# Patient Record
Sex: Female | Born: 1945
Health system: Southern US, Community
[De-identification: ages and names within clinical notes are randomized; demographics above are authoritative.]

## PROBLEM LIST (undated history)

## (undated) DIAGNOSIS — I10 Essential (primary) hypertension: Secondary | ICD-10-CM

## (undated) DIAGNOSIS — N200 Calculus of kidney: Secondary | ICD-10-CM

## (undated) DIAGNOSIS — E785 Hyperlipidemia, unspecified: Secondary | ICD-10-CM

## (undated) DIAGNOSIS — R002 Palpitations: Secondary | ICD-10-CM

## (undated) DIAGNOSIS — I471 Supraventricular tachycardia, unspecified: Secondary | ICD-10-CM

## (undated) DIAGNOSIS — R7303 Prediabetes: Secondary | ICD-10-CM

## (undated) DIAGNOSIS — R0789 Other chest pain: Secondary | ICD-10-CM

## (undated) DIAGNOSIS — F419 Anxiety disorder, unspecified: Secondary | ICD-10-CM

## (undated) DIAGNOSIS — K219 Gastro-esophageal reflux disease without esophagitis: Secondary | ICD-10-CM

## (undated) HISTORY — DX: Prediabetes: R73.03

## (undated) HISTORY — PX: TUBAL LIGATION: SHX77

## (undated) HISTORY — DX: Other chest pain: R07.89

## (undated) HISTORY — DX: Hyperlipidemia, unspecified: E78.5

## (undated) HISTORY — DX: Supraventricular tachycardia, unspecified: I47.10

## (undated) HISTORY — DX: Supraventricular tachycardia: I47.1

---

## 1997-06-21 ENCOUNTER — Ambulatory Visit (HOSPITAL_COMMUNITY): Admission: RE | Admit: 1997-06-21 | Discharge: 1997-06-21 | Payer: Self-pay | Admitting: Obstetrics and Gynecology

## 1997-11-13 ENCOUNTER — Other Ambulatory Visit: Admission: RE | Admit: 1997-11-13 | Discharge: 1997-11-13 | Payer: Self-pay | Admitting: Obstetrics and Gynecology

## 1998-11-19 ENCOUNTER — Other Ambulatory Visit: Admission: RE | Admit: 1998-11-19 | Discharge: 1998-11-19 | Payer: Self-pay | Admitting: Obstetrics and Gynecology

## 1998-12-03 ENCOUNTER — Ambulatory Visit (HOSPITAL_COMMUNITY): Admission: RE | Admit: 1998-12-03 | Discharge: 1998-12-03 | Payer: Self-pay | Admitting: Internal Medicine

## 1999-12-02 ENCOUNTER — Other Ambulatory Visit: Admission: RE | Admit: 1999-12-02 | Discharge: 1999-12-02 | Payer: Self-pay | Admitting: Obstetrics and Gynecology

## 2001-05-09 ENCOUNTER — Other Ambulatory Visit: Admission: RE | Admit: 2001-05-09 | Discharge: 2001-05-09 | Payer: Self-pay | Admitting: Obstetrics and Gynecology

## 2001-06-01 ENCOUNTER — Encounter: Payer: Self-pay | Admitting: Obstetrics and Gynecology

## 2001-06-01 ENCOUNTER — Ambulatory Visit (HOSPITAL_COMMUNITY): Admission: RE | Admit: 2001-06-01 | Discharge: 2001-06-01 | Payer: Self-pay | Admitting: Obstetrics and Gynecology

## 2002-05-29 ENCOUNTER — Other Ambulatory Visit: Admission: RE | Admit: 2002-05-29 | Discharge: 2002-05-29 | Payer: Self-pay | Admitting: Obstetrics and Gynecology

## 2003-07-13 ENCOUNTER — Other Ambulatory Visit: Admission: RE | Admit: 2003-07-13 | Discharge: 2003-07-13 | Payer: Self-pay | Admitting: Obstetrics and Gynecology

## 2004-10-21 ENCOUNTER — Other Ambulatory Visit: Admission: RE | Admit: 2004-10-21 | Discharge: 2004-10-21 | Payer: Self-pay | Admitting: Obstetrics and Gynecology

## 2005-07-18 ENCOUNTER — Emergency Department (HOSPITAL_COMMUNITY): Admission: EM | Admit: 2005-07-18 | Discharge: 2005-07-18 | Payer: Self-pay | Admitting: Family Medicine

## 2005-10-01 ENCOUNTER — Encounter: Admission: RE | Admit: 2005-10-01 | Discharge: 2005-10-01 | Payer: Self-pay | Admitting: Internal Medicine

## 2008-01-03 ENCOUNTER — Encounter: Admission: RE | Admit: 2008-01-03 | Discharge: 2008-01-03 | Payer: Self-pay | Admitting: Neurology

## 2009-06-19 HISTORY — PX: CARDIOVASCULAR STRESS TEST: SHX262

## 2009-10-20 ENCOUNTER — Observation Stay (HOSPITAL_COMMUNITY): Admission: EM | Admit: 2009-10-20 | Discharge: 2009-10-20 | Payer: Self-pay | Admitting: Emergency Medicine

## 2010-02-16 ENCOUNTER — Encounter: Payer: Self-pay | Admitting: Orthopedic Surgery

## 2010-04-10 LAB — URINALYSIS, ROUTINE W REFLEX MICROSCOPIC
Bilirubin Urine: NEGATIVE
Glucose, UA: NEGATIVE mg/dL
Hgb urine dipstick: NEGATIVE
Ketones, ur: NEGATIVE mg/dL
Nitrite: NEGATIVE
Protein, ur: 30 mg/dL — AB
Specific Gravity, Urine: 1.019 (ref 1.005–1.030)
Urobilinogen, UA: 0.2 mg/dL (ref 0.0–1.0)
pH: 8.5 — ABNORMAL HIGH (ref 5.0–8.0)

## 2010-04-10 LAB — URINE MICROSCOPIC-ADD ON

## 2010-06-21 ENCOUNTER — Emergency Department (HOSPITAL_COMMUNITY)
Admission: EM | Admit: 2010-06-21 | Discharge: 2010-06-22 | Disposition: A | Discharge status: Home / Self Care | Payer: BC Managed Care – PPO | Attending: Emergency Medicine | Admitting: Emergency Medicine

## 2010-06-21 DIAGNOSIS — R61 Generalized hyperhidrosis: Secondary | ICD-10-CM | POA: Insufficient documentation

## 2010-06-21 DIAGNOSIS — R1032 Left lower quadrant pain: Secondary | ICD-10-CM | POA: Insufficient documentation

## 2010-06-21 DIAGNOSIS — R0989 Other specified symptoms and signs involving the circulatory and respiratory systems: Secondary | ICD-10-CM | POA: Insufficient documentation

## 2010-06-21 DIAGNOSIS — R0789 Other chest pain: Secondary | ICD-10-CM | POA: Insufficient documentation

## 2010-06-21 DIAGNOSIS — R51 Headache: Secondary | ICD-10-CM | POA: Insufficient documentation

## 2010-06-21 DIAGNOSIS — R0609 Other forms of dyspnea: Secondary | ICD-10-CM | POA: Insufficient documentation

## 2010-06-21 DIAGNOSIS — R1012 Left upper quadrant pain: Secondary | ICD-10-CM | POA: Insufficient documentation

## 2010-06-21 DIAGNOSIS — R110 Nausea: Secondary | ICD-10-CM | POA: Insufficient documentation

## 2010-06-21 DIAGNOSIS — N390 Urinary tract infection, site not specified: Secondary | ICD-10-CM | POA: Insufficient documentation

## 2010-06-21 DIAGNOSIS — R42 Dizziness and giddiness: Secondary | ICD-10-CM | POA: Insufficient documentation

## 2010-06-22 LAB — URINALYSIS, ROUTINE W REFLEX MICROSCOPIC
Bilirubin Urine: NEGATIVE
Glucose, UA: NEGATIVE mg/dL
Ketones, ur: NEGATIVE mg/dL
Nitrite: NEGATIVE
Protein, ur: NEGATIVE mg/dL
Specific Gravity, Urine: 1.006 (ref 1.005–1.030)
Urobilinogen, UA: 0.2 mg/dL (ref 0.0–1.0)
pH: 7.5 (ref 5.0–8.0)

## 2010-06-22 LAB — COMPREHENSIVE METABOLIC PANEL
ALT: 20 U/L (ref 0–35)
AST: 21 U/L (ref 0–37)
Albumin: 3.9 g/dL (ref 3.5–5.2)
Alkaline Phosphatase: 69 U/L (ref 39–117)
BUN: 14 mg/dL (ref 6–23)
CO2: 23 mEq/L (ref 19–32)
Calcium: 9.4 mg/dL (ref 8.4–10.5)
Chloride: 106 mEq/L (ref 96–112)
Creatinine, Ser: 0.67 mg/dL (ref 0.4–1.2)
GFR calc Af Amer: >60 mL/min (ref >60)
GFR calc non Af Amer: >60 mL/min (ref >60)
Glucose, Bld: 134 mg/dL — ABNORMAL HIGH (ref 70–99)
Potassium: 3.5 mEq/L (ref 3.5–5.1)
Sodium: 141 mEq/L (ref 135–145)
Total Bilirubin: 0.2 mg/dL — ABNORMAL LOW (ref 0.3–1.2)
Total Protein: 7.6 g/dL (ref 6.0–8.3)

## 2010-06-22 LAB — CBC
HCT: 37.6 % (ref 36.0–46.0)
Hemoglobin: 13.3 g/dL (ref 12.0–15.0)
MCH: 30.1 pg (ref 26.0–34.0)
MCHC: 35.4 g/dL (ref 30.0–36.0)
MCV: 85.1 fL (ref 78.0–100.0)
Platelets: 248 10*3/uL (ref 150–400)
RBC: 4.42 MIL/uL (ref 3.87–5.11)
RDW: 12.9 % (ref 11.5–15.5)
WBC: 11.1 10*3/uL — ABNORMAL HIGH (ref 4.0–10.5)

## 2010-06-22 LAB — DIFFERENTIAL
Basophils Absolute: 0 10*3/uL (ref 0.0–0.1)
Basophils Relative: 0 % (ref 0–1)
Eosinophils Absolute: 0.1 10*3/uL (ref 0.0–0.7)
Eosinophils Relative: 1 % (ref 0–5)
Lymphocytes Relative: 20 % (ref 12–46)
Lymphs Abs: 2.2 10*3/uL (ref 0.7–4.0)
Monocytes Absolute: 0.4 10*3/uL (ref 0.1–1.0)
Monocytes Relative: 4 % (ref 3–12)
Neutro Abs: 8.4 10*3/uL — ABNORMAL HIGH (ref 1.7–7.7)
Neutrophils Relative %: 76 % (ref 43–77)

## 2010-06-22 LAB — CK TOTAL AND CKMB (NOT AT ARMC)
CK, MB: 1.5 ng/mL (ref 0.3–4.0)
Relative Index: 1.4 (ref 0.0–2.5)
Total CK: 104 U/L (ref 7–177)

## 2010-06-22 LAB — TROPONIN I: Troponin I: <0.3 ng/mL (ref <0.30)

## 2010-06-22 LAB — URINE MICROSCOPIC-ADD ON

## 2010-06-22 LAB — LIPASE, BLOOD: Lipase: 17 U/L (ref 11–59)

## 2011-08-05 DIAGNOSIS — F411 Generalized anxiety disorder: Secondary | ICD-10-CM | POA: Diagnosis not present

## 2011-08-05 DIAGNOSIS — R82998 Other abnormal findings in urine: Secondary | ICD-10-CM | POA: Diagnosis not present

## 2011-08-05 DIAGNOSIS — I10 Essential (primary) hypertension: Secondary | ICD-10-CM | POA: Diagnosis not present

## 2011-12-14 DIAGNOSIS — R1031 Right lower quadrant pain: Secondary | ICD-10-CM | POA: Diagnosis not present

## 2011-12-14 DIAGNOSIS — I10 Essential (primary) hypertension: Secondary | ICD-10-CM | POA: Diagnosis not present

## 2011-12-14 DIAGNOSIS — R002 Palpitations: Secondary | ICD-10-CM | POA: Diagnosis not present

## 2011-12-14 DIAGNOSIS — E559 Vitamin D deficiency, unspecified: Secondary | ICD-10-CM | POA: Diagnosis not present

## 2011-12-14 DIAGNOSIS — Z0000 Encounter for general adult medical examination without abnormal findings: Secondary | ICD-10-CM | POA: Diagnosis not present

## 2011-12-22 DIAGNOSIS — R1031 Right lower quadrant pain: Secondary | ICD-10-CM | POA: Diagnosis not present

## 2011-12-22 DIAGNOSIS — R109 Unspecified abdominal pain: Secondary | ICD-10-CM | POA: Diagnosis not present

## 2012-01-27 ENCOUNTER — Emergency Department (HOSPITAL_COMMUNITY)
Admission: EM | Admit: 2012-01-27 | Discharge: 2012-01-27 | Disposition: A | Discharge status: Home / Self Care | Payer: BC Managed Care – PPO | Source: Home / Self Care | Attending: Family Medicine | Admitting: Family Medicine

## 2012-01-27 ENCOUNTER — Encounter (HOSPITAL_COMMUNITY): Payer: Self-pay | Admitting: *Deleted

## 2012-01-27 DIAGNOSIS — R002 Palpitations: Secondary | ICD-10-CM

## 2012-01-27 HISTORY — DX: Anxiety disorder, unspecified: F41.9

## 2012-01-27 HISTORY — DX: Essential (primary) hypertension: I10

## 2012-01-27 HISTORY — DX: Palpitations: R00.2

## 2012-01-27 NOTE — ED Provider Notes (Signed)
History     CSN: [id number]  Arrival date & time 01/27/12  1155   First MD Initiated Contact with Patient 01/27/12 1309      Chief Complaint  Patient presents with  . Palpitations  . Shortness of Breath    (Consider location/radiation/quality/duration/timing/severity/associated sxs/prior treatment) Patient is a 67 y.o. female presenting with palpitations and shortness of breath. The history is provided by the patient and the spouse.  Palpitations  This is a recurrent problem. The current episode started more than 2 days ago. The problem has not changed since onset.The problem is associated with anxiety. Associated symptoms include irregular heartbeat, near-syncope and shortness of breath. Pertinent negatives include no fever, no chest pain and no chest pressure. Her past medical history does not include hyperthyroidism.  Shortness of Breath  Associated symptoms include shortness of breath. Pertinent negatives include no chest pain, no chest pressure, no fever and no wheezing.    Past Medical History  Diagnosis Date  . Heart palpitations   . Anxiety   . Hypertension     Past Surgical History  Procedure Date  . Tubal ligation     No family history on file.  History  Substance Use Topics  . Smoking status: Never Smoker   . Smokeless tobacco: Not on file  . Alcohol Use: No    OB History    Grav Para Term Preterm Abortions TAB SAB Ect Mult Living                  Review of Systems  Constitutional: Negative for fever.  Respiratory: Positive for shortness of breath. Negative for chest tightness and wheezing.   Cardiovascular: Positive for palpitations and near-syncope. Negative for chest pain and leg swelling.    Allergies  Penicillins  Home Medications   Current Outpatient Rx  Name  Route  Sig  Dispense  Refill  . ALPRAZOLAM 0.25 MG PO TABS   Oral   Take 0.25 mg by mouth as needed.         . ASPIRIN 81 MG PO TABS   Oral   Take 81 mg by mouth daily.           [name] CALCIUM CITRATE PO   Oral   Take by mouth.         . METOPROLOL SUCCINATE ER 25 MG PO TB24   Oral   Take 25 mg by mouth daily.         . CENTRUM SILVER PO   Oral   Take by mouth.           BP 137/65  Pulse 71  Temp 98 F (36.7 C) (Oral)  Resp 20  SpO2 97%  Physical Exam  Nursing note and vitals reviewed. Constitutional: She is oriented to person, place, and time. She appears well-developed and well-nourished.  HENT:  Mouth/Throat: Oropharynx is clear and moist.  Eyes: Pupils are equal, round, and reactive to light.  Neck: Normal range of motion. Neck supple. No thyromegaly present.  Cardiovascular: Normal rate, regular rhythm, normal heart sounds and intact distal pulses.   Pulmonary/Chest: Effort normal and breath sounds normal.  Neurological: She is alert and oriented to person, place, and time.  Skin: Skin is warm and dry.    ED Course  Procedures (including critical care time)  Labs Reviewed - No data to display No results found.   1. Heart palpitations       MDM  [id number].  Linna Hoff, MD 01/27/12 1330

## 2012-01-27 NOTE — ED Notes (Signed)
Has been having SOB with palpitations over past 3 days; sxs worse when laying down and when driving to work.  States she feels near-syncopal at times or "like I can't catch my breath".  Has hx palpitations - hasn't seen cardiologist in several years.  Denies any palpitations or SOB @ present.  HR regular @ present.  Also c/o tingling to left hand intermittently over past month; has hx bursitis.

## 2012-08-01 DIAGNOSIS — I10 Essential (primary) hypertension: Secondary | ICD-10-CM | POA: Diagnosis not present

## 2012-08-01 DIAGNOSIS — Z79899 Other long term (current) drug therapy: Secondary | ICD-10-CM | POA: Diagnosis not present

## 2012-09-30 ENCOUNTER — Other Ambulatory Visit: Payer: Self-pay | Admitting: Family

## 2012-09-30 DIAGNOSIS — R319 Hematuria, unspecified: Secondary | ICD-10-CM | POA: Diagnosis not present

## 2012-09-30 DIAGNOSIS — R1031 Right lower quadrant pain: Secondary | ICD-10-CM | POA: Diagnosis not present

## 2012-09-30 DIAGNOSIS — Z79899 Other long term (current) drug therapy: Secondary | ICD-10-CM | POA: Diagnosis not present

## 2012-09-30 DIAGNOSIS — R109 Unspecified abdominal pain: Secondary | ICD-10-CM

## 2012-10-03 ENCOUNTER — Ambulatory Visit
Admission: RE | Admit: 2012-10-03 | Discharge: 2012-10-03 | Disposition: A | Discharge status: Home / Self Care | Payer: Medicare Other | Source: Ambulatory Visit | Attending: Family | Admitting: Family

## 2012-10-03 DIAGNOSIS — R109 Unspecified abdominal pain: Secondary | ICD-10-CM

## 2012-10-03 DIAGNOSIS — N200 Calculus of kidney: Secondary | ICD-10-CM | POA: Diagnosis not present

## 2012-10-03 DIAGNOSIS — R319 Hematuria, unspecified: Secondary | ICD-10-CM

## 2013-01-18 DIAGNOSIS — R4589 Other symptoms and signs involving emotional state: Secondary | ICD-10-CM | POA: Diagnosis not present

## 2013-01-18 DIAGNOSIS — E785 Hyperlipidemia, unspecified: Secondary | ICD-10-CM | POA: Diagnosis not present

## 2013-01-18 DIAGNOSIS — Z79899 Other long term (current) drug therapy: Secondary | ICD-10-CM | POA: Diagnosis not present

## 2013-01-18 DIAGNOSIS — I10 Essential (primary) hypertension: Secondary | ICD-10-CM | POA: Diagnosis not present

## 2013-01-18 DIAGNOSIS — Z0000 Encounter for general adult medical examination without abnormal findings: Secondary | ICD-10-CM | POA: Diagnosis not present

## 2013-02-10 DIAGNOSIS — M899 Disorder of bone, unspecified: Secondary | ICD-10-CM | POA: Diagnosis not present

## 2013-02-10 DIAGNOSIS — N949 Unspecified condition associated with female genital organs and menstrual cycle: Secondary | ICD-10-CM | POA: Diagnosis not present

## 2013-02-10 DIAGNOSIS — M949 Disorder of cartilage, unspecified: Secondary | ICD-10-CM | POA: Diagnosis not present

## 2013-02-10 DIAGNOSIS — Z124 Encounter for screening for malignant neoplasm of cervix: Secondary | ICD-10-CM | POA: Diagnosis not present

## 2013-02-10 DIAGNOSIS — Z1231 Encounter for screening mammogram for malignant neoplasm of breast: Secondary | ICD-10-CM | POA: Diagnosis not present

## 2013-02-10 DIAGNOSIS — Z9189 Other specified personal risk factors, not elsewhere classified: Secondary | ICD-10-CM | POA: Diagnosis not present

## 2013-02-10 DIAGNOSIS — R1031 Right lower quadrant pain: Secondary | ICD-10-CM | POA: Diagnosis not present

## 2013-02-23 DIAGNOSIS — K5900 Constipation, unspecified: Secondary | ICD-10-CM | POA: Diagnosis not present

## 2013-02-23 DIAGNOSIS — Z1211 Encounter for screening for malignant neoplasm of colon: Secondary | ICD-10-CM | POA: Diagnosis not present

## 2013-03-12 ENCOUNTER — Emergency Department (HOSPITAL_COMMUNITY): Payer: Medicare Other

## 2013-03-12 ENCOUNTER — Emergency Department (HOSPITAL_COMMUNITY)
Admission: EM | Admit: 2013-03-12 | Discharge: 2013-03-12 | Disposition: A | Discharge status: Home / Self Care | Payer: Medicare Other | Attending: Emergency Medicine | Admitting: Emergency Medicine

## 2013-03-12 ENCOUNTER — Encounter (HOSPITAL_COMMUNITY): Payer: Self-pay | Admitting: Emergency Medicine

## 2013-03-12 DIAGNOSIS — Z880 Allergy status to penicillin: Secondary | ICD-10-CM | POA: Diagnosis not present

## 2013-03-12 DIAGNOSIS — Z8659 Personal history of other mental and behavioral disorders: Secondary | ICD-10-CM | POA: Diagnosis not present

## 2013-03-12 DIAGNOSIS — J159 Unspecified bacterial pneumonia: Secondary | ICD-10-CM | POA: Diagnosis not present

## 2013-03-12 DIAGNOSIS — J111 Influenza due to unidentified influenza virus with other respiratory manifestations: Secondary | ICD-10-CM | POA: Diagnosis not present

## 2013-03-12 DIAGNOSIS — R059 Cough, unspecified: Secondary | ICD-10-CM | POA: Diagnosis not present

## 2013-03-12 DIAGNOSIS — Z79899 Other long term (current) drug therapy: Secondary | ICD-10-CM | POA: Diagnosis not present

## 2013-03-12 DIAGNOSIS — I10 Essential (primary) hypertension: Secondary | ICD-10-CM | POA: Diagnosis not present

## 2013-03-12 DIAGNOSIS — J189 Pneumonia, unspecified organism: Secondary | ICD-10-CM

## 2013-03-12 DIAGNOSIS — Z7982 Long term (current) use of aspirin: Secondary | ICD-10-CM | POA: Insufficient documentation

## 2013-03-12 DIAGNOSIS — R05 Cough: Secondary | ICD-10-CM | POA: Diagnosis not present

## 2013-03-12 DIAGNOSIS — R6889 Other general symptoms and signs: Secondary | ICD-10-CM | POA: Diagnosis not present

## 2013-03-12 DIAGNOSIS — R509 Fever, unspecified: Secondary | ICD-10-CM | POA: Diagnosis not present

## 2013-03-12 LAB — CBC WITH DIFFERENTIAL/PLATELET
Basophils Absolute: 0 10*3/uL (ref 0.0–0.1)
Basophils Relative: 0 % (ref 0–1)
Eosinophils Absolute: 0 10*3/uL (ref 0.0–0.7)
Eosinophils Relative: 0 % (ref 0–5)
HCT: 35.8 % — ABNORMAL LOW (ref 36.0–46.0)
Hemoglobin: 12.8 g/dL (ref 12.0–15.0)
Lymphocytes Relative: 8 % — ABNORMAL LOW (ref 12–46)
Lymphs Abs: 1.1 10*3/uL (ref 0.7–4.0)
MCH: 30.8 pg (ref 26.0–34.0)
MCHC: 35.8 g/dL (ref 30.0–36.0)
MCV: 86.3 fL (ref 78.0–100.0)
Monocytes Absolute: 0.7 10*3/uL (ref 0.1–1.0)
Monocytes Relative: 5 % (ref 3–12)
Neutro Abs: 12.2 10*3/uL — ABNORMAL HIGH (ref 1.7–7.7)
Neutrophils Relative %: 87 % — ABNORMAL HIGH (ref 43–77)
Platelets: 208 10*3/uL (ref 150–400)
RBC: 4.15 MIL/uL (ref 3.87–5.11)
RDW: 13.5 % (ref 11.5–15.5)
WBC Morphology: INCREASED
WBC: 14 10*3/uL — ABNORMAL HIGH (ref 4.0–10.5)

## 2013-03-12 LAB — BASIC METABOLIC PANEL
BUN: 9 mg/dL (ref 6–23)
CO2: 21 mEq/L (ref 19–32)
Calcium: 8.8 mg/dL (ref 8.4–10.5)
Chloride: 100 mEq/L (ref 96–112)
Creatinine, Ser: 0.63 mg/dL (ref 0.50–1.10)
GFR calc Af Amer: >90 mL/min (ref >90)
GFR calc non Af Amer: >90 mL/min (ref >90)
Glucose, Bld: 116 mg/dL — ABNORMAL HIGH (ref 70–99)
Potassium: 3.6 mEq/L — ABNORMAL LOW (ref 3.7–5.3)
Sodium: 138 mEq/L (ref 137–147)

## 2013-03-12 LAB — TROPONIN I: Troponin I: <0.3 ng/mL (ref <0.30)

## 2013-03-12 MED ORDER — AZITHROMYCIN 250 MG PO TABS
500.0000 mg | ORAL_TABLET | Freq: Once | ORAL | Status: AC
  Administered 2013-03-12: 500 mg via ORAL
  Filled 2013-03-12: qty 2

## 2013-03-12 MED ORDER — BENZONATATE 100 MG PO CAPS: 200.0000 mg | ORAL_CAPSULE | Freq: Two times a day (BID) | ORAL | Status: DC

## 2013-03-12 MED ORDER — CEFTRIAXONE SODIUM 1 G IJ SOLR: 1.0000 g | Freq: Once | INTRAMUSCULAR | Status: DC

## 2013-03-12 MED ORDER — AZITHROMYCIN 250 MG PO TABS: 250.0000 mg | ORAL_TABLET | Freq: Every day | ORAL | Status: DC

## 2013-03-12 MED ORDER — DEXTROSE 5 % IV SOLN
1.0000 g | INTRAVENOUS | Status: DC
  Administered 2013-03-12: 1 g via INTRAVENOUS
  Filled 2013-03-12: qty 10

## 2013-03-12 NOTE — ED Notes (Signed)
To ED from home via [facility] with report of fever and cough X1w, denies V/D, no pain at rest, denies CP/SOB, VSS, ambulatory and in NAD

## 2013-03-12 NOTE — ED Provider Notes (Signed)
CSN: [id number]     Arrival date & time 03/12/13  1006 History   First MD Initiated Contact with Patient 03/12/13 1014     Chief Complaint  Patient presents with  . Cough  . Fever     (Consider location/radiation/quality/duration/timing/severity/associated sxs/prior Treatment) Patient is a 68 y.o. female presenting with cough and fever. The history is provided by the patient. No language interpreter was used.  Cough Cough characteristics:  Productive Sputum characteristics:  Nondescript Severity:  Severe Onset quality:  Gradual Duration:  1 week Timing:  Constant Chronicity:  New Smoker: no   Relieved by:  Nothing Worsened by:  Nothing tried Ineffective treatments:  None tried Associated symptoms: fever   Risk factors: recent infection   Fever Associated symptoms: cough     Past Medical History  Diagnosis Date  . Heart palpitations   . Anxiety   . Hypertension    Past Surgical History  Procedure Laterality Date  . Tubal ligation     No family history on file. History  Substance Use Topics  . Smoking status: Never Smoker   . Smokeless tobacco: Not on file  . Alcohol Use: No   OB History   Grav Para Term Preterm Abortions TAB SAB Ect Mult Living                 Review of Systems  Constitutional: Positive for fever.  Respiratory: Positive for cough.   All other systems reviewed and are negative.      Allergies  Penicillins  Home Medications   Current Outpatient Rx  Name  Route  Sig  Dispense  Refill  . Ascorbic Acid (VITAMIN C PO)   Oral   Take 1 tablet by mouth daily.         [name] aspirin 81 MG tablet   Oral   Take 81 mg by mouth daily.         [name] CALCIUM CITRATE PO   Oral   Take 1 tablet by mouth daily.          . metoprolol succinate (TOPROL-XL) 25 MG 24 hr tablet   Oral   Take 25 mg by mouth daily.         . Multiple Vitamins-Minerals (CENTRUM SILVER PO)   Oral   Take by mouth.         [name] OVER THE COUNTER MEDICATION    Oral   Take 1 tablet by mouth daily. "Mega Red"          BP 128/58  Pulse 102  Temp(Src) 100.1 F (37.8 C) (Rectal)  Resp 28  SpO2 95% Physical Exam  Nursing note and vitals reviewed. Constitutional: She is oriented to person, place, and time. She appears well-developed and well-nourished.  HENT:  Head: Normocephalic.  Right Ear: External ear normal.  Left Ear: External ear normal.  Nose: Nose normal.  Mouth/Throat: Oropharynx is clear and moist.  Eyes: Conjunctivae and EOM are normal. Pupils are equal, round, and reactive to light.  Neck: Normal range of motion. Neck supple.  Cardiovascular: Normal rate, regular rhythm and normal heart sounds.   Pulmonary/Chest: She exhibits tenderness.  Productive cough, rhonchi lungs  Abdominal: Soft. She exhibits no distension.  Musculoskeletal: Normal range of motion.  Neurological: She is alert and oriented to person, place, and time.  Skin: Skin is warm.  Psychiatric: She has a normal mood and affect.    ED Course  Procedures (including critical care time) Labs Review Labs Reviewed  CBC  WITH DIFFERENTIAL - Abnormal; Notable for the following:    WBC 14.0 (*)    HCT 35.8 (*)    Neutrophils Relative % 87 (*)    Lymphocytes Relative 8 (*)    Neutro Abs 12.2 (*)    All other components within normal limits  TROPONIN I  BASIC METABOLIC PANEL   Imaging Review No results found.  EKG Interpretation   None       MDM   Final diagnoses:  Community acquired pneumonia    Rocephin 1 gram IM.   Zithromax 500mg  now   Pt given rx for zithromax and tessalon perles.   Pt advised to see her Md for recheck in 2-3 days    Fransico Meadow, [place] 03/12/13 1152

## 2013-03-12 NOTE — Discharge Instructions (Signed)

## 2013-03-12 NOTE — ED Provider Notes (Signed)
Medical screening examination/treatment/procedure(s) were performed by non-physician practitioner and as supervising physician I was immediately available for consultation/collaboration.  EKG Interpretation   None         Saddie Benders. Alvah Gilder, MD 03/12/13 1154

## 2013-03-12 NOTE — ED Notes (Signed)
Pt took 1000 mg Tylenol at 0300 and 500 mg pta, reported temp 100.6 at home

## 2013-03-16 ENCOUNTER — Encounter (HOSPITAL_COMMUNITY): Payer: Self-pay | Admitting: Emergency Medicine

## 2013-03-16 ENCOUNTER — Emergency Department (HOSPITAL_COMMUNITY)
Admission: EM | Admit: 2013-03-16 | Discharge: 2013-03-16 | Disposition: A | Discharge status: Home / Self Care | Payer: Medicare Other | Attending: Emergency Medicine | Admitting: Emergency Medicine

## 2013-03-16 ENCOUNTER — Emergency Department (HOSPITAL_COMMUNITY): Payer: Medicare Other

## 2013-03-16 DIAGNOSIS — I10 Essential (primary) hypertension: Secondary | ICD-10-CM | POA: Diagnosis not present

## 2013-03-16 DIAGNOSIS — R630 Anorexia: Secondary | ICD-10-CM | POA: Diagnosis not present

## 2013-03-16 DIAGNOSIS — Z880 Allergy status to penicillin: Secondary | ICD-10-CM | POA: Insufficient documentation

## 2013-03-16 DIAGNOSIS — J189 Pneumonia, unspecified organism: Secondary | ICD-10-CM

## 2013-03-16 DIAGNOSIS — R059 Cough, unspecified: Secondary | ICD-10-CM | POA: Diagnosis not present

## 2013-03-16 DIAGNOSIS — Z7982 Long term (current) use of aspirin: Secondary | ICD-10-CM | POA: Insufficient documentation

## 2013-03-16 DIAGNOSIS — Z79899 Other long term (current) drug therapy: Secondary | ICD-10-CM | POA: Insufficient documentation

## 2013-03-16 DIAGNOSIS — Z8659 Personal history of other mental and behavioral disorders: Secondary | ICD-10-CM | POA: Insufficient documentation

## 2013-03-16 DIAGNOSIS — J159 Unspecified bacterial pneumonia: Secondary | ICD-10-CM | POA: Diagnosis not present

## 2013-03-16 DIAGNOSIS — Z87891 Personal history of nicotine dependence: Secondary | ICD-10-CM | POA: Insufficient documentation

## 2013-03-16 DIAGNOSIS — R0602 Shortness of breath: Secondary | ICD-10-CM

## 2013-03-16 DIAGNOSIS — J9819 Other pulmonary collapse: Secondary | ICD-10-CM | POA: Diagnosis not present

## 2013-03-16 DIAGNOSIS — R05 Cough: Secondary | ICD-10-CM

## 2013-03-16 LAB — COMPREHENSIVE METABOLIC PANEL
ALT: 15 U/L (ref 0–35)
AST: 18 U/L (ref 0–37)
Albumin: 3.8 g/dL (ref 3.5–5.2)
Alkaline Phosphatase: 64 U/L (ref 39–117)
BUN: 8 mg/dL (ref 6–23)
CO2: 21 mEq/L (ref 19–32)
Calcium: 9.3 mg/dL (ref 8.4–10.5)
Chloride: 104 mEq/L (ref 96–112)
Creatinine, Ser: 0.58 mg/dL (ref 0.50–1.10)
GFR calc Af Amer: >90 mL/min (ref >90)
GFR calc non Af Amer: >90 mL/min (ref >90)
Glucose, Bld: 92 mg/dL (ref 70–99)
Potassium: 3.7 mEq/L (ref 3.7–5.3)
Sodium: 141 mEq/L (ref 137–147)
Total Bilirubin: 0.4 mg/dL (ref 0.3–1.2)
Total Protein: 7.8 g/dL (ref 6.0–8.3)

## 2013-03-16 LAB — CBC WITH DIFFERENTIAL/PLATELET
Basophils Absolute: 0 10*3/uL (ref 0.0–0.1)
Basophils Relative: 0 % (ref 0–1)
Eosinophils Absolute: 0.1 10*3/uL (ref 0.0–0.7)
Eosinophils Relative: 2 % (ref 0–5)
HCT: 36 % (ref 36.0–46.0)
Hemoglobin: 12.9 g/dL (ref 12.0–15.0)
Lymphocytes Relative: 34 % (ref 12–46)
Lymphs Abs: 2.7 10*3/uL (ref 0.7–4.0)
MCH: 30.6 pg (ref 26.0–34.0)
MCHC: 35.8 g/dL (ref 30.0–36.0)
MCV: 85.5 fL (ref 78.0–100.0)
Monocytes Absolute: 0.5 10*3/uL (ref 0.1–1.0)
Monocytes Relative: 6 % (ref 3–12)
Neutro Abs: 4.6 10*3/uL (ref 1.7–7.7)
Neutrophils Relative %: 58 % (ref 43–77)
Platelets: 296 10*3/uL (ref 150–400)
RBC: 4.21 MIL/uL (ref 3.87–5.11)
RDW: 13.2 % (ref 11.5–15.5)
WBC: 8 10*3/uL (ref 4.0–10.5)

## 2013-03-16 LAB — PRO B NATRIURETIC PEPTIDE: Pro B Natriuretic peptide (BNP): 64.5 pg/mL (ref 0–125)

## 2013-03-16 LAB — I-STAT TROPONIN, ED: Troponin i, poc: 0 ng/mL (ref 0.00–0.08)

## 2013-03-16 MED ORDER — DEXAMETHASONE SODIUM PHOSPHATE 10 MG/ML IJ SOLN
10.0000 mg | Freq: Once | INTRAMUSCULAR | Status: AC
  Administered 2013-03-16: 10 mg via INTRAVENOUS
  Filled 2013-03-16: qty 1

## 2013-03-16 MED ORDER — LEVALBUTEROL HCL 0.63 MG/3ML IN NEBU
0.6300 mg | INHALATION_SOLUTION | Freq: Once | RESPIRATORY_TRACT | Status: AC
  Administered 2013-03-16: 0.63 mg via RESPIRATORY_TRACT
  Filled 2013-03-16: qty 3

## 2013-03-16 MED ORDER — LEVALBUTEROL TARTRATE 45 MCG/ACT IN AERO
2.0000 | INHALATION_SPRAY | Freq: Four times a day (QID) | RESPIRATORY_TRACT | Status: DC | PRN
  Administered 2013-03-16: 2 via RESPIRATORY_TRACT
  Filled 2013-03-16: qty 15

## 2013-03-16 MED ORDER — IPRATROPIUM BROMIDE 0.02 % IN SOLN
0.5000 mg | Freq: Once | RESPIRATORY_TRACT | Status: AC
  Administered 2013-03-16: 0.5 mg via RESPIRATORY_TRACT
  Filled 2013-03-16: qty 2.5

## 2013-03-16 NOTE — ED Notes (Signed)
Pt SpO2 remained ar 98% while ambulating

## 2013-03-16 NOTE — ED Notes (Signed)
PA at bedside.

## 2013-03-16 NOTE — ED Provider Notes (Signed)
CSN: [id number]     Arrival date & time 03/16/13  1215 History   First MD Initiated Contact with Patient 03/16/13 1227     Chief Complaint  Patient presents with  . Shortness of Breath     (Consider location/radiation/quality/duration/timing/severity/associated sxs/prior Treatment) Patient is a 68 y.o. female presenting with shortness of breath.  Shortness of Breath Associated symptoms: cough and wheezing    68 yo female presents with SOB and cough x 1 week. Patient seen Sunday at [facility] ED for similar sxs. Patient diagnosed at that time with Pnuemonia and started on Zpak. Patient states the Zpak made her dizzy, patient states she took them up until Wednesday. Patient then started on Doxycycline by PCP and Zpak discontinued. Patient states sxs did get a little better but states she is still having chest congestion. Denies any fever/chills, chest pain, N/V, or abdominal pain. Patient admits to multiple loose stools since first seen at hospital originally on Sunday.  Past Medical History  Diagnosis Date  . Heart palpitations   . Anxiety   . Hypertension    Past Surgical History  Procedure Laterality Date  . Tubal ligation     History reviewed. No pertinent family history. History  Substance Use Topics  . Smoking status: Former Research scientist (life sciences)  . Smokeless tobacco: Not on file  . Alcohol Use: No   OB History   Grav Para Term Preterm Abortions TAB SAB Ect Mult Living                 Review of Systems  Constitutional: Positive for appetite change.  HENT: Positive for congestion and rhinorrhea.   Respiratory: Positive for cough, chest tightness, shortness of breath and wheezing.   All other systems reviewed and are negative.      Allergies  Penicillins  Home Medications   Current Outpatient Rx  Name  Route  Sig  Dispense  Refill  . Ascorbic Acid (VITAMIN C PO)   Oral   Take 1 tablet by mouth daily.         [name] aspirin 81 MG tablet   Oral   Take 81 mg by mouth daily.         [name] CALCIUM CITRATE PO   Oral   Take 1 tablet by mouth daily.          [name] doxycycline (VIBRAMYCIN) 100 MG capsule   Oral   Take 100 mg by mouth 2 (two) times daily. 10 day course. Started on 03/15/13.         . metoprolol succinate (TOPROL-XL) 25 MG 24 hr tablet   Oral   Take 25 mg by mouth daily.         . Multiple Vitamins-Minerals (CENTRUM SILVER PO)   Oral   Take 1 tablet by mouth daily.          [name] OVER THE COUNTER MEDICATION   Oral   Take 1 tablet by mouth daily. "Mega Red"          BP 121/61  Pulse 75  Temp(Src) 97.5 F (36.4 C) (Oral)  Resp 22  SpO2 97% Physical Exam  Nursing note and vitals reviewed. Constitutional: She is oriented to person, place, and time. She appears well-developed and well-nourished.  HENT:  Head: Normocephalic and atraumatic.  Mouth/Throat: Uvula is midline, oropharynx is clear and moist and mucous membranes are normal.  Eyes: Conjunctivae are normal.  Neck: Normal range of motion. Neck supple. No JVD present. No tracheal deviation present.  Cardiovascular:  Normal rate, regular rhythm and intact distal pulses.  Exam reveals no gallop and no friction rub.   No murmur heard. Pulmonary/Chest: She is in respiratory distress (mild). She has wheezes. She has rhonchi.  Musculoskeletal: Normal range of motion. She exhibits no edema.  Neurological: She is alert and oriented to person, place, and time.  Skin: Skin is warm and dry. She is not diaphoretic.  Psychiatric: She has a normal mood and affect. Her behavior is normal.    ED Course  Procedures (including critical care time) Labs Review Labs Reviewed  COMPREHENSIVE METABOLIC PANEL  CBC WITH DIFFERENTIAL  PRO B NATRIURETIC PEPTIDE  I-STAT [place], ED   Imaging Review Dg Chest 2 View  03/16/2013   CLINICAL DATA:  Pneumonia .  EXAM: CHEST  2 VIEW  COMPARISON:  DG CHEST 2 VIEW dated 03/12/2013  FINDINGS: Mediastinum and hilar structures are normal. Previously identified mild  atelectatic changes in the region lingula partially. . Lungs otherwise clear. Tiny opacity over the right upper lobe is most likely outside the patient, this was not present on prior chest x-ray. No pleural effusion or pneumothorax. Heart size normal. No acute bony abnormality.  IMPRESSION: Interim partial clearing of mild lingular atelectasis. Minimal residual.   Electronically Signed   By: Marcello Moores  Register   On: 03/16/2013 13:38    EKG Interpretation   None       MDM   Final diagnoses:  CAP (community acquired pneumonia)  Shortness of breath  Cough   Lab work unremarkable CXR improved from prior Pulse ox stable at 98% with ambulation in hallway.  Patient feeling much better after breathing treatment. Patient tolerates Levalbuterol much better than Albuterol. Plan to have patient follow up in 2-3 days with PCP for recheck.  Patient given MDI inhaler. Advised patient to continue Doxycycline until finished. Patient agrees with plan. Discharged in good condition.  Meds given in ED:  Medications  levalbuterol [facility] HFA) inhaler 2 puff (2 puffs Inhalation Given 03/16/13 1611)  levalbuterol (XOPENEX) nebulizer solution 0.63 mg (0.63 mg Nebulization Given 03/16/13 1327)  ipratropium (ATROVENT) nebulizer solution 0.5 mg (0.5 mg Nebulization Given 03/16/13 1446)  levalbuterol (XOPENEX) nebulizer solution 0.63 mg (0.63 mg Nebulization Given 03/16/13 1445)  dexamethasone (DECADRON) injection 10 mg (10 mg Intravenous Given 03/16/13 1541)    Discharge Medication List as of 03/16/2013  3:08 PM         Sherrie George, PA-C 03/16/13 1901

## 2013-03-16 NOTE — ED Notes (Signed)
Pt states she tried to use the inhaler prescribed but it caused her chest to feel like it was on fire. Pt stopped taking a gel pill (unsure which one) because it caused severe diarrhea. Pt states she has been taking her antibiotics as prescribed.

## 2013-03-16 NOTE — Discharge Instructions (Signed)
Continue your current antibiotic (Doxycycline) until finished. Follow up with your doctor in 2-3 days for reevaluation. Return to Emergency Department if you develop any worsening symptoms, Chest pain, shortness of breath, or fever/chills.   Pneumonia, Adult    Pneumonia is an infection of the lungs.  CAUSES  Pneumonia may be caused by bacteria or a virus. Usually, these infections are caused by breathing infectious particles into the lungs (respiratory tract).  SYMPTOMS  Cough.  Fever.  Chest pain.  Increased rate of breathing.  Wheezing.  Mucus production. DIAGNOSIS  If you have the common symptoms of pneumonia, your caregiver will typically confirm the diagnosis with a chest X-ray. The X-ray will show an abnormality in the lung (pulmonary infiltrate) if you have pneumonia. Other tests of your blood, urine, or sputum may be done to find the specific cause of your pneumonia. Your caregiver may also do tests (blood gases or pulse oximetry) to see how well your lungs are working.  TREATMENT  Some forms of pneumonia may be spread to other people when you cough or sneeze. You may be asked to wear a mask before and during your exam. Pneumonia that is caused by bacteria is treated with antibiotic medicine. Pneumonia that is caused by the influenza virus may be treated with an antiviral medicine. Most other viral infections must run their course. These infections will not respond to antibiotics.  PREVENTION  A pneumococcal shot (vaccine) is available to prevent a common bacterial cause of pneumonia. This is usually suggested for:  People over 75 years old.  Patients on chemotherapy.  People with chronic lung problems, such as bronchitis or emphysema.  People with immune system problems. If you are over 65 or have a high risk condition, you may receive the pneumococcal vaccine if you have not received it before. In some countries, a routine influenza vaccine is also recommended. This vaccine can  help prevent some cases of pneumonia. You may be offered the influenza vaccine as part of your care.  If you smoke, it is time to quit. You may receive instructions on how to stop smoking. Your caregiver can provide medicines and counseling to help you quit.  HOME CARE INSTRUCTIONS  Cough suppressants may be used if you are losing too much rest. However, coughing protects you by clearing your lungs. You should avoid using cough suppressants if you can.  Your caregiver may have prescribed medicine if he or she thinks your pneumonia is caused by a bacteria or influenza. Finish your medicine even if you start to feel better.  Your caregiver may also prescribe an expectorant. This loosens the mucus to be coughed up.  Only take over-the-counter or prescription medicines for pain, discomfort, or fever as directed by your caregiver.  Do not smoke. Smoking is a common cause of bronchitis and can contribute to pneumonia. If you are a smoker and continue to smoke, your cough may last several weeks after your pneumonia has cleared.  A cold steam vaporizer or humidifier in your room or home may help loosen mucus.  Coughing is often worse at night. Sleeping in a semi-upright position in a recliner or using a couple pillows under your head will help with this.  Get rest as you feel it is needed. Your body will usually let you know when you need to rest. SEEK IMMEDIATE MEDICAL CARE IF:  Your illness becomes worse. This is especially true if you are elderly or weakened from any other disease.  You cannot control your  cough with suppressants and are losing sleep.  You begin coughing up blood.  You develop pain which is getting worse or is uncontrolled with medicines.  You have a fever.  Any of the symptoms which initially brought you in for treatment are getting worse rather than better.  You develop shortness of breath or chest pain. MAKE SURE YOU:  Understand these instructions.  Will watch your condition.    Will get help right away if you are not doing well or get worse. Document Released: 01/12/2005 Document Revised: 04/06/2011 Document Reviewed: 04/03/2010  [facility] Patient Information 2014 [place], [place].

## 2013-03-16 NOTE — ED Notes (Signed)
Pt states she was dx w/ pneu on 2/15 has been taking meds but is no better hurts to breath

## 2013-03-16 NOTE — ED Notes (Signed)
Pt states that the breathing treatment helped her a lot. Pt states that it did not cause a burning sensation and now she can cough so she feels like she is getting stuff up.

## 2013-03-18 ENCOUNTER — Emergency Department (HOSPITAL_COMMUNITY)
Admission: EM | Admit: 2013-03-18 | Discharge: 2013-03-18 | Disposition: A | Discharge status: Home / Self Care | Payer: Medicare Other | Attending: Emergency Medicine | Admitting: Emergency Medicine

## 2013-03-18 ENCOUNTER — Encounter (HOSPITAL_COMMUNITY): Payer: Self-pay | Admitting: Emergency Medicine

## 2013-03-18 ENCOUNTER — Emergency Department (HOSPITAL_COMMUNITY): Payer: Medicare Other

## 2013-03-18 DIAGNOSIS — M549 Dorsalgia, unspecified: Secondary | ICD-10-CM | POA: Insufficient documentation

## 2013-03-18 DIAGNOSIS — Z880 Allergy status to penicillin: Secondary | ICD-10-CM | POA: Diagnosis not present

## 2013-03-18 DIAGNOSIS — Z8659 Personal history of other mental and behavioral disorders: Secondary | ICD-10-CM | POA: Diagnosis not present

## 2013-03-18 DIAGNOSIS — Z79899 Other long term (current) drug therapy: Secondary | ICD-10-CM | POA: Diagnosis not present

## 2013-03-18 DIAGNOSIS — Z7982 Long term (current) use of aspirin: Secondary | ICD-10-CM | POA: Insufficient documentation

## 2013-03-18 DIAGNOSIS — J209 Acute bronchitis, unspecified: Secondary | ICD-10-CM | POA: Insufficient documentation

## 2013-03-18 DIAGNOSIS — J189 Pneumonia, unspecified organism: Secondary | ICD-10-CM | POA: Diagnosis not present

## 2013-03-18 DIAGNOSIS — Z87891 Personal history of nicotine dependence: Secondary | ICD-10-CM | POA: Diagnosis not present

## 2013-03-18 DIAGNOSIS — Z792 Long term (current) use of antibiotics: Secondary | ICD-10-CM | POA: Diagnosis not present

## 2013-03-18 DIAGNOSIS — R0789 Other chest pain: Secondary | ICD-10-CM | POA: Diagnosis not present

## 2013-03-18 DIAGNOSIS — I10 Essential (primary) hypertension: Secondary | ICD-10-CM | POA: Insufficient documentation

## 2013-03-18 DIAGNOSIS — R0602 Shortness of breath: Secondary | ICD-10-CM | POA: Diagnosis not present

## 2013-03-18 DIAGNOSIS — R079 Chest pain, unspecified: Secondary | ICD-10-CM | POA: Diagnosis not present

## 2013-03-18 LAB — CBC WITH DIFFERENTIAL/PLATELET
Basophils Absolute: 0 10*3/uL (ref 0.0–0.1)
Basophils Relative: 0 % (ref 0–1)
Eosinophils Absolute: 0.1 10*3/uL (ref 0.0–0.7)
Eosinophils Relative: 1 % (ref 0–5)
HCT: 36.4 % (ref 36.0–46.0)
Hemoglobin: 13 g/dL (ref 12.0–15.0)
Lymphocytes Relative: 25 % (ref 12–46)
Lymphs Abs: 2.8 10*3/uL (ref 0.7–4.0)
MCH: 30.8 pg (ref 26.0–34.0)
MCHC: 35.7 g/dL (ref 30.0–36.0)
MCV: 86.3 fL (ref 78.0–100.0)
Monocytes Absolute: 0.4 10*3/uL (ref 0.1–1.0)
Monocytes Relative: 4 % (ref 3–12)
Neutro Abs: 7.9 10*3/uL — ABNORMAL HIGH (ref 1.7–7.7)
Neutrophils Relative %: 71 % (ref 43–77)
Platelets: 316 10*3/uL (ref 150–400)
RBC: 4.22 MIL/uL (ref 3.87–5.11)
RDW: 13.4 % (ref 11.5–15.5)
WBC: 11.2 10*3/uL — ABNORMAL HIGH (ref 4.0–10.5)

## 2013-03-18 LAB — BASIC METABOLIC PANEL
BUN: 12 mg/dL (ref 6–23)
CO2: 21 mEq/L (ref 19–32)
Calcium: 9.5 mg/dL (ref 8.4–10.5)
Chloride: 107 mEq/L (ref 96–112)
Creatinine, Ser: 0.68 mg/dL (ref 0.50–1.10)
GFR calc Af Amer: >90 mL/min (ref >90)
GFR calc non Af Amer: 89 mL/min — ABNORMAL LOW (ref >90)
Glucose, Bld: 121 mg/dL — ABNORMAL HIGH (ref 70–99)
Potassium: 3.1 mEq/L — ABNORMAL LOW (ref 3.7–5.3)
Sodium: 145 mEq/L (ref 137–147)

## 2013-03-18 MED ORDER — IPRATROPIUM BROMIDE HFA 17 MCG/ACT IN AERS: 2.0000 | INHALATION_SPRAY | RESPIRATORY_TRACT | Status: DC | PRN

## 2013-03-18 MED ORDER — ALBUTEROL SULFATE HFA 108 (90 BASE) MCG/ACT IN AERS
2.0000 | INHALATION_SPRAY | RESPIRATORY_TRACT | Status: DC | PRN
  Administered 2013-03-18: 2 via RESPIRATORY_TRACT
  Filled 2013-03-18: qty 6.7

## 2013-03-18 MED ORDER — PREDNISONE 50 MG PO TABS: 50.0000 mg | ORAL_TABLET | Freq: Every day | ORAL | Status: DC

## 2013-03-18 MED ORDER — IPRATROPIUM BROMIDE HFA 17 MCG/ACT IN AERS
2.0000 | INHALATION_SPRAY | RESPIRATORY_TRACT | Status: AC
  Administered 2013-03-18: 2 via RESPIRATORY_TRACT
  Filled 2013-03-18: qty 12.9

## 2013-03-18 MED ORDER — IPRATROPIUM-ALBUTEROL 0.5-2.5 (3) MG/3ML IN SOLN
3.0000 mL | Freq: Once | RESPIRATORY_TRACT | Status: AC
  Administered 2013-03-18: 3 mL via RESPIRATORY_TRACT
  Filled 2013-03-18: qty 3

## 2013-03-18 MED ORDER — ALBUTEROL SULFATE HFA 108 (90 BASE) MCG/ACT IN AERS: 2.0000 | INHALATION_SPRAY | RESPIRATORY_TRACT | Status: DC | PRN

## 2013-03-18 MED ORDER — IPRATROPIUM BROMIDE 0.02 % IN SOLN
0.5000 mg | Freq: Once | RESPIRATORY_TRACT | Status: AC
  Administered 2013-03-18: 0.5 mg via RESPIRATORY_TRACT
  Filled 2013-03-18: qty 2.5

## 2013-03-18 MED ORDER — ALBUTEROL (5 MG/ML) CONTINUOUS INHALATION SOLN
15.0000 mg/h | INHALATION_SOLUTION | Freq: Once | RESPIRATORY_TRACT | Status: AC
  Administered 2013-03-18: 15 mg/h via RESPIRATORY_TRACT
  Filled 2013-03-18: qty 20

## 2013-03-18 NOTE — ED Notes (Signed)
Page respiratory when Atrovent at bedside.

## 2013-03-18 NOTE — ED Notes (Addendum)
Pt ambulated normal with O2 saturation of 97% and pulse of 118 after receiving Atrovent and Albuterol.    Niece at bedside.  Dr. Dina Rich updated.

## 2013-03-18 NOTE — ED Notes (Signed)
Respiratory at bedside, reviewing the use of the spacer for home use.

## 2013-03-18 NOTE — ED Notes (Signed)
Respiratory paged, Atrovent delivered.

## 2013-03-18 NOTE — ED Notes (Signed)
Respiratory at bedside.

## 2013-03-18 NOTE — Discharge Instructions (Signed)
Acute Bronchitis Bronchitis is inflammation of the airways that extend from the windpipe into the lungs (bronchi). The inflammation often causes mucus to develop. This leads to a cough, which is the most common symptom of bronchitis.  In acute bronchitis, the condition usually develops suddenly and goes away over time, usually in a couple weeks. Smoking, allergies, and asthma can make bronchitis worse. Repeated episodes of bronchitis may cause further lung problems.  CAUSES Acute bronchitis is most often caused by the same virus that causes a cold. The virus can spread from person to person (contagious).  SIGNS AND SYMPTOMS   Cough.   Fever.   Coughing up mucus.   Body aches.   Chest congestion.   Chills.   Shortness of breath.   Sore throat.  DIAGNOSIS  Acute bronchitis is usually diagnosed through a physical exam. Tests, such as chest X-rays, are sometimes done to rule out other conditions.  TREATMENT  Acute bronchitis usually goes away in a couple weeks. Often times, no medical treatment is necessary. Medicines are sometimes given for relief of fever or cough. Antibiotics are usually not needed but may be prescribed in certain situations. In some cases, an inhaler may be recommended to help reduce shortness of breath and control the cough. A cool mist vaporizer may also be used to help thin bronchial secretions and make it easier to clear the chest.  HOME CARE INSTRUCTIONS  Get plenty of rest.   Drink enough fluids to keep your urine clear or pale yellow (unless you have a medical condition that requires fluid restriction). Increasing fluids may help thin your secretions and will prevent dehydration.   Only take over-the-counter or prescription medicines as directed by your health care provider.   Avoid smoking and secondhand smoke. Exposure to cigarette smoke or irritating chemicals will make bronchitis worse. If you are a smoker, consider using nicotine gum or skin  patches to help control withdrawal symptoms. Quitting smoking will help your lungs heal faster.   Reduce the chances of another bout of acute bronchitis by washing your hands frequently, avoiding people with cold symptoms, and trying not to touch your hands to your mouth, nose, or eyes.   Follow up with your health care provider as directed.  SEEK MEDICAL CARE IF: Your symptoms do not improve after 1 week of treatment.  SEEK IMMEDIATE MEDICAL CARE IF:  You develop an increased fever or chills.   You have chest pain.   You have severe shortness of breath.  You have bloody sputum.   You develop dehydration.  You develop fainting.  You develop repeated vomiting.  You develop a severe headache. MAKE SURE YOU:   Understand these instructions.  Will watch your condition.  Will get help right away if you are not doing well or get worse. Document Released: 02/20/2004 Document Revised: 09/14/2012 Document Reviewed: 07/05/2012 [employer] Patient Information 2014 [employer].  

## 2013-03-18 NOTE — ED Notes (Signed)
Pt returned from x-ray.  Respiratory paged.

## 2013-03-18 NOTE — ED Notes (Signed)
Pt states "I felt like my throat was closing up and could not catch my breath all night".    MD Horton at bedside.

## 2013-03-18 NOTE — ED Notes (Signed)
Patient presents to ED with c/o of Shortness of breath with associated chest pain and back pain.  Pt denies the chest pain has radiation.  Pt states the chest and back pain is worse during episodes of coughing.  Pt was discharged from hospital last Sunday and in the ED yesterday for respiratory related diagnosis.  Pt given 5mg  Albuterol inhaler and 125mg  Solumedrol in route.

## 2013-03-18 NOTE — ED Provider Notes (Signed)
CSN: [id number]     Arrival date & time 03/18/13  0848 History   First MD Initiated Contact with Patient 03/18/13 [phone]     No chief complaint on file.    (Consider location/radiation/quality/duration/timing/severity/associated sxs/prior Treatment) HPI  This is a 68 yo female with history of hypertension, anxiety, and remote history of smoking who presents with shortness of breath. Patient states that she was diagnosed with pneumonia on Sunday. She reports progressive shortness of breath. She states that she was seen yesterday and given breathing treatments.  She was discharged home with an albuterol inhaler. She states she is unsure she doesn't use the inhaler correctly because it didn't seem to help her overnight. She reports persistent shortness of breath associated chest pain and back pain. Chest pain is sternal and nonradiating. Currently 4/10. Chest pain and back pain worse with coughing. She is still taking antibiotics for her pneumonia. She denies any fevers. She has persistent cough.  Past Medical History  Diagnosis Date  . Heart palpitations   . Anxiety   . Hypertension    Past Surgical History  Procedure Laterality Date  . Tubal ligation     History reviewed. No pertinent family history. History  Substance Use Topics  . Smoking status: Former Games developermoker  . Smokeless tobacco: Not on file  . Alcohol Use: No   OB History   Grav Para Term Preterm Abortions TAB SAB Ect Mult Living                 Review of Systems  Constitutional: Negative for fever.  Respiratory: Positive for cough, chest tightness and shortness of breath.   Cardiovascular: Positive for chest pain.  Gastrointestinal: Negative for nausea, vomiting and abdominal pain.  Genitourinary: Negative for dysuria.  Musculoskeletal: Positive for back pain.  Skin: Negative for rash.  Neurological: Negative for headaches.  Psychiatric/Behavioral: Negative for confusion.  All other systems reviewed and are  negative.      Allergies  Penicillins  Home Medications   Current Outpatient Rx  Name  Route  Sig  Dispense  Refill  . Ascorbic Acid (VITAMIN C PO)   Oral   Take 1 tablet by mouth daily.         [name]. aspirin 81 MG tablet   Oral   Take 81 mg by mouth daily.         [name]. CALCIUM CITRATE PO   Oral   Take 1 tablet by mouth daily.          [name]. doxycycline (VIBRAMYCIN) 100 MG capsule   Oral   Take 100 mg by mouth 2 (two) times daily. 10 day course. Started on 03/15/13.         . metoprolol succinate (TOPROL-XL) 25 MG 24 hr tablet   Oral   Take 25 mg by mouth daily.         . Multiple Vitamins-Minerals (CENTRUM SILVER PO)   Oral   Take 1 tablet by mouth daily.          [name]. OVER THE COUNTER MEDICATION   Oral   Take 1 tablet by mouth daily. "Mega Red"         . albuterol (PROVENTIL HFA;VENTOLIN HFA) 108 (90 BASE) MCG/ACT inhaler   Inhalation   Inhale 2 puffs into the lungs every 4 (four) hours as needed for wheezing or shortness of breath.   1 Inhaler   0   . ipratropium (ATROVENT HFA) 17 MCG/ACT inhaler   Inhalation   Inhale  2 puffs into the lungs every 4 (four) hours as needed for wheezing.   1 Inhaler   0   . predniSONE (DELTASONE) 50 MG tablet   Oral   Take 1 tablet (50 mg total) by mouth daily with breakfast.   4 tablet   0    BP 116/54  Pulse 109  Temp(Src) 97.8 F (36.6 C) (Oral)  Resp 22  Ht 5\' 1"  (1.549 m)  Wt 141 lb (63.957 kg)  BMI 26.66 kg/m2  SpO2 95% Physical Exam  Nursing note and vitals reviewed. Constitutional: She is oriented to person, place, and time. She appears well-developed and well-nourished. No distress.  HENT:  Head: Normocephalic and atraumatic.  Mouth/Throat: Oropharynx is clear and moist.  Eyes: Pupils are equal, round, and reactive to light.  Neck: Neck supple.  Cardiovascular: Normal rate, regular rhythm and normal heart sounds.   No murmur heard. Pulmonary/Chest: Effort normal. No respiratory distress. She has  wheezes. She has no rales. She exhibits tenderness.  Abdominal: Soft. There is no tenderness.  Musculoskeletal: She exhibits no edema.  Neurological: She is alert and oriented to person, place, and time.  Skin: Skin is warm and dry. No rash noted.  Psychiatric:  Anxious    ED Course  Procedures (including critical care time) Labs Review Labs Reviewed  CBC WITH DIFFERENTIAL - Abnormal; Notable for the following:    WBC 11.2 (*)    Neutro Abs 7.9 (*)    All other components within normal limits  BASIC METABOLIC PANEL - Abnormal; Notable for the following:    Potassium 3.1 (*)    Glucose, Bld 121 (*)    GFR calc non Af Amer 89 (*)    All other components within normal limits   Imaging Review Dg Chest 2 View  03/18/2013   CLINICAL DATA:  Shortness of breath, chest pain  EXAM: CHEST  2 VIEW  COMPARISON:  03/16/2013  FINDINGS: The heart size and mediastinal contours are within normal limits. Both lungs are clear. The visualized skeletal structures are unremarkable.  IMPRESSION: No active cardiopulmonary disease.   Electronically Signed   By: Daryll Brod M.D.   On: 03/18/2013 09:35    EKG Interpretation    Date/Time:  Saturday March 18 2013 08:51:42 EST Ventricular Rate:  71 PR Interval:  179 QRS Duration: 89 QT Interval:  385 QTC Calculation: 418 R Axis:   74 Text Interpretation:  Sinus rhythm Borderline repolarization abnormality Confirmed by HORTON  MD, COURTNEY ([id number]) on 03/18/2013 10:05:11 AM           Medications  albuterol (PROVENTIL,VENTOLIN) solution continuous neb (15 mg/hr Nebulization Given 03/18/13 0933)  ipratropium (ATROVENT) nebulizer solution 0.5 mg (0.5 mg Nebulization Given 03/18/13 0933)  ipratropium-albuterol (DUONEB) 0.5-2.5 (3) MG/3ML nebulizer solution 3 mL (3 mLs Nebulization Given 03/18/13 1216)  ipratropium (ATROVENT HFA) inhaler 2 puff (2 puffs Inhalation Given 03/18/13 1316)    MDM   Final diagnoses:  Bronchitis, acute, with bronchospasm   Pneumonia    Patient presents with shortness of breath and known diagnosis of pneumonia.  She is nontoxic-appearing. She appears anxious. She is wheezing on exam. She was given Solu-Medrol by EMS. She states that she was given a steroid shot yesterday but was not sent home with steroids.  Patient wheezing on exam.  Duoneb cont x1, intermittent x1.  Patient is much improved.  Have ordered albuterol, atrovent and spacer.  Patient ambulated without difficulty and maintained O2 sats.  Will d/c home with close  PCP f/u.  After history, exam, and medical workup I feel the patient has been appropriately medically screened and is safe for discharge home. Pertinent diagnoses were discussed with the patient. Patient was given return precautions.    Merryl Hacker, MD 03/18/13 Lurline Hare

## 2013-03-20 DIAGNOSIS — Z79899 Other long term (current) drug therapy: Secondary | ICD-10-CM | POA: Diagnosis not present

## 2013-03-20 DIAGNOSIS — J40 Bronchitis, not specified as acute or chronic: Secondary | ICD-10-CM | POA: Diagnosis not present

## 2013-03-20 DIAGNOSIS — Z09 Encounter for follow-up examination after completed treatment for conditions other than malignant neoplasm: Secondary | ICD-10-CM | POA: Diagnosis not present

## 2013-03-20 DIAGNOSIS — J189 Pneumonia, unspecified organism: Secondary | ICD-10-CM | POA: Diagnosis not present

## 2013-03-20 NOTE — ED Provider Notes (Signed)
Medical screening examination/treatment/procedure(s) were conducted as a shared visit with non-physician practitioner(s) and myself.  I personally evaluated the patient during the encounter.  EKG Interpretation    Date/Time:  Thursday March 16 2013 12:17:54 EST Ventricular Rate:  79 PR Interval:  176 QRS Duration: 76 QT Interval:  358 QTC Calculation: 410 R Axis:   73 Text Interpretation:  Normal sinus rhythm ST abnormality, possible digitalis effect Abnormal ECG ED PHYSICIAN INTERPRETATION AVAILABLE IN [facility] Confirmed by TEST, RECORD ([id number]) on 03/18/2013 9:52:49 AM            Pt seen and examined.  History reviewed.  SOB c cough, but no pain.  Exam with clear lungs after treatment.  Plan is Dc with treatment outlined by PA--continued treatment for partially cleared lingular CAP.  Tanna Furry, MD 03/20/13 [phone]

## 2013-03-27 ENCOUNTER — Ambulatory Visit (INDEPENDENT_AMBULATORY_CARE_PROVIDER_SITE_OTHER): Payer: Medicare Other | Admitting: Cardiovascular Disease

## 2013-03-27 ENCOUNTER — Encounter: Payer: Self-pay | Admitting: Cardiovascular Disease

## 2013-03-27 VITALS — BP 120/72 | HR 72 | Ht 61.0 in | Wt 148.0 lb

## 2013-03-27 DIAGNOSIS — R0789 Other chest pain: Secondary | ICD-10-CM | POA: Insufficient documentation

## 2013-03-27 DIAGNOSIS — I471 Supraventricular tachycardia: Secondary | ICD-10-CM

## 2013-03-27 NOTE — Patient Instructions (Signed)
Follow up with us as needed

## 2013-03-27 NOTE — Progress Notes (Signed)
03/27/2013 Brittany Harvey   [birth year]  [id number]  Primary Physician Salena Saner., MD Primary Cardiologist: Lorretta Harp MD [name]   HPI:  Brittany Harvey is a 68 year old moderately overweight widowed Caucasian female who I last saw back in 2009. She saw Dr. Sallyanne Kuster  back in 2011.[name] She has a history of PSVT remotely back in 2004. She had a false positive GXT back in 2011 which led to a Myoview which was normal. She gets atypical chest pain and palpitations.   Current Outpatient Prescriptions  Medication Sig Dispense Refill  . albuterol (PROVENTIL HFA;VENTOLIN HFA) 108 (90 BASE) MCG/ACT inhaler Inhale 2 puffs into the lungs every 4 (four) hours as needed for wheezing or shortness of breath.  1 Inhaler  0  . Ascorbic Acid (VITAMIN C PO) Take 1 tablet by mouth daily.      [name] aspirin 81 MG tablet Take 81 mg by mouth daily.      [name] CALCIUM CITRATE PO Take 1 tablet by mouth daily.       [name] doxycycline (VIBRAMYCIN) 100 MG capsule Take 100 mg by mouth 2 (two) times daily. 10 day course. Started on 03/15/13.      [name] ipratropium (ATROVENT HFA) 17 MCG/ACT inhaler Inhale 2 puffs into the lungs every 4 (four) hours as needed for wheezing.  1 Inhaler  0  . metoprolol succinate (TOPROL-XL) 25 MG 24 hr tablet Take 25 mg by mouth daily.      . Multiple Vitamins-Minerals (CENTRUM SILVER PO) Take 1 tablet by mouth daily.       [name] OVER THE COUNTER MEDICATION Take 1 tablet by mouth daily. "Mega Red"       No current facility-administered medications for this visit.    Allergies  Allergen Reactions  . Penicillins Anaphylaxis and Swelling  . Zithromax [Azithromycin]     presyncope    History   Social History  . Marital Status: Married    Spouse Name: [name]    Number of Children: [name]  . Years of Education: [name]   Occupational History  . Not on file.   Social History Main Topics  . Smoking status: Former Research scientist (life sciences)  . Smokeless tobacco: Not on file  . Alcohol Use: No  . Drug Use:  No  . Sexual Activity: Not on file   Other Topics Concern  . Not on file   Social History Narrative  . No narrative on file     Review of Systems: General: negative for chills, fever, night sweats or weight changes.  Cardiovascular: negative for chest pain, dyspnea on exertion, edema, orthopnea, palpitations, paroxysmal nocturnal dyspnea or shortness of breath Dermatological: negative for rash Respiratory: negative for cough or wheezing Urologic: negative for hematuria Abdominal: negative for nausea, vomiting, diarrhea, bright red blood per rectum, melena, or hematemesis Neurologic: negative for visual changes, syncope, or dizziness All other systems reviewed and are otherwise negative except as noted above.    Blood pressure 120/72, pulse 72, height 5\' 1"  (1.549 m), weight 67.132 kg (148 lb).  General appearance: alert and no distress Neck: no adenopathy, no carotid bruit, no JVD, supple, symmetrical, trachea midline and thyroid not enlarged, symmetric, no tenderness/mass/nodules Lungs: clear to auscultation bilaterally Heart: regular rate and rhythm, S1, S2 normal, no murmur, click, rub or gallop Abdomen: soft, non-tender; bowel sounds normal; no masses,  no organomegaly Extremities: extremities normal, atraumatic, no cyanosis or edema  EKG normal sinus rhythm at 72 without ST or T wave changes  ASSESSMENT AND PLAN:   PSVT (paroxysmal supraventricular tachycardia) Patient has a remote history of PSVT dating back to 2004. She gets occasional palpitations but really has not been bothered by this in the recent past. She does have a normal 2-D echo.  Atypical chest pain Patient has a long history of atypical chest pain. She had a false positive GXT which led to a normal biofeedback because of 11. She was evaluated by Dr. Sallyanne Kuster will thought that her pain was noncardiac. She gets occasional chest pain female was likewise probably is noncardiac.      Lorretta Harp MD  FACP,FACC,FAHA, [facility] 03/27/2013 3:23 PM

## 2013-03-27 NOTE — Assessment & Plan Note (Signed)
Patient has a remote history of PSVT dating back to 2004. She gets occasional palpitations but really has not been bothered by this in the recent past. She does have a normal 2-D echo.

## 2013-03-27 NOTE — Assessment & Plan Note (Signed)
Patient has a long history of atypical chest pain. She had a false positive GXT which led to a normal biofeedback because of 11. She was evaluated by Dr. Sallyanne Kuster will thought that her pain was noncardiac. She gets occasional chest pain female was likewise probably is noncardiac.

## 2013-04-09 ENCOUNTER — Emergency Department (INDEPENDENT_AMBULATORY_CARE_PROVIDER_SITE_OTHER)
Admission: EM | Admit: 2013-04-09 | Discharge: 2013-04-09 | Disposition: A | Discharge status: Home / Self Care | Payer: Medicare Other | Source: Home / Self Care | Attending: Family Medicine | Admitting: Family Medicine

## 2013-04-09 ENCOUNTER — Encounter (HOSPITAL_COMMUNITY): Payer: Self-pay | Admitting: Emergency Medicine

## 2013-04-09 ENCOUNTER — Emergency Department (INDEPENDENT_AMBULATORY_CARE_PROVIDER_SITE_OTHER): Payer: Medicare Other

## 2013-04-09 DIAGNOSIS — J069 Acute upper respiratory infection, unspecified: Secondary | ICD-10-CM | POA: Diagnosis not present

## 2013-04-09 DIAGNOSIS — R059 Cough, unspecified: Secondary | ICD-10-CM | POA: Diagnosis not present

## 2013-04-09 DIAGNOSIS — R05 Cough: Secondary | ICD-10-CM | POA: Diagnosis not present

## 2013-04-09 NOTE — Discharge Instructions (Signed)
Drink plenty of fluids as discussed,  and mucinex or delsym for cough. Return or see your doctor if further problems °

## 2013-04-09 NOTE — ED Notes (Signed)
C/o difficultly breathing which started today States she is having a hard time swallowing She does have a cough.   Voice is going hoarse.

## 2013-04-09 NOTE — ED Provider Notes (Signed)
CSN: [id number]     Arrival date & time 04/09/13  1848 History   First MD Initiated Contact with Patient 04/09/13 1858     Chief Complaint  Patient presents with  . Breathing Problem   (Consider location/radiation/quality/duration/timing/severity/associated sxs/prior Treatment) Patient is a 68 y.o. female presenting with difficulty breathing. The history is provided by the patient.  Breathing Problem This is a new problem. The current episode started 3 to 5 hours ago (2 weeks ago had pna then bronchitis, seen in ER and by lmd, told was resolved, today took cough med and throat became v dry  and cough and sob developed, scaring pt.). The problem has been gradually worsening. Associated symptoms include shortness of breath.    Past Medical History  Diagnosis Date  . Heart palpitations   . Anxiety   . Hypertension   . PSVT (paroxysmal supraventricular tachycardia)   . Atypical chest pain    Past Surgical History  Procedure Laterality Date  . Tubal ligation    . Cardiovascular stress test  06/19/2009    Post stress myocardial perfusion images show a normal pattern of perfusion in all regions. ECG positive for ischemia. Appears to be a "false-positive" ECG stress test.  . Transthoracic echocardiogram  06/19/2009    EF 77%, mild mitral annular calcification, no evidence of mitral valve prolapse on this supine, unprovoked study, no significant mitral valve thickening.   History reviewed. No pertinent family history. History  Substance Use Topics  . Smoking status: Former Research scientist (life sciences)  . Smokeless tobacco: Not on file  . Alcohol Use: No   OB History   Grav Para Term Preterm Abortions TAB SAB Ect Mult Living                 Review of Systems  Constitutional: Negative.   HENT: Negative.   Respiratory: Positive for cough and shortness of breath.   Cardiovascular: Negative for leg swelling.  Gastrointestinal: Negative.     Allergies  Penicillins and Zithromax  Home Medications    Current Outpatient Rx  Name  Route  Sig  Dispense  Refill  . albuterol (PROVENTIL HFA;VENTOLIN HFA) 108 (90 BASE) MCG/ACT inhaler   Inhalation   Inhale 2 puffs into the lungs every 4 (four) hours as needed for wheezing or shortness of breath.   1 Inhaler   0   . Ascorbic Acid (VITAMIN C PO)   Oral   Take 1 tablet by mouth daily.         [name] aspirin 81 MG tablet   Oral   Take 81 mg by mouth daily.         [name] CALCIUM CITRATE PO   Oral   Take 1 tablet by mouth daily.          [name] doxycycline (VIBRAMYCIN) 100 MG capsule   Oral   Take 100 mg by mouth 2 (two) times daily. 10 day course. Started on 03/15/13.         [name] ipratropium (ATROVENT HFA) 17 MCG/ACT inhaler   Inhalation   Inhale 2 puffs into the lungs every 4 (four) hours as needed for wheezing.   1 Inhaler   0   . metoprolol succinate (TOPROL-XL) 25 MG 24 hr tablet   Oral   Take 25 mg by mouth daily.         . Multiple Vitamins-Minerals (CENTRUM SILVER PO)   Oral   Take 1 tablet by mouth daily.          [name]  OVER THE COUNTER MEDICATION   Oral   Take 1 tablet by mouth daily. "Mega Red"          BP 142/71  Pulse 77  Temp(Src) 99 F (37.2 C) (Oral)  Resp 18  SpO2 100% Physical Exam  Nursing note and vitals reviewed. Constitutional: She is oriented to person, place, and time. She appears well-developed and well-nourished.  HENT:  Right Ear: External ear normal.  Left Ear: External ear normal.  Mouth/Throat: Oropharynx is clear and moist.  Neck: Normal range of motion. Neck supple.  Cardiovascular: Normal heart sounds and intact distal pulses.   Pulmonary/Chest: Effort normal and breath sounds normal. No respiratory distress. She has no wheezes. She has no rales.  Lymphadenopathy:    She has no cervical adenopathy.  Neurological: She is alert and oriented to person, place, and time.  Skin: Skin is warm and dry.    ED Course  Procedures (including critical care time) Labs Review Labs Reviewed -  No data to display Imaging Review Dg Chest 2 View  04/09/2013   CLINICAL DATA:  Persistent cough and shortness of breath.  EXAM: CHEST  2 VIEW  COMPARISON:  03/18/2013  FINDINGS: The heart size and mediastinal contours are within normal limits. Both lungs are clear. The visualized skeletal structures are unremarkable.  IMPRESSION: Normal exam.   Electronically Signed   By: Rozetta Nunnery M.D.   On: 04/09/2013 19:48   X-rays reviewed and report per radiologist.   MDM   1. URI (upper respiratory infection)        Billy Fischer, MD 04/09/13 2002

## 2013-04-10 DIAGNOSIS — R5383 Other fatigue: Secondary | ICD-10-CM | POA: Diagnosis not present

## 2013-04-10 DIAGNOSIS — G4701 Insomnia due to medical condition: Secondary | ICD-10-CM | POA: Diagnosis not present

## 2013-04-10 DIAGNOSIS — R42 Dizziness and giddiness: Secondary | ICD-10-CM | POA: Diagnosis not present

## 2013-04-10 DIAGNOSIS — R5381 Other malaise: Secondary | ICD-10-CM | POA: Diagnosis not present

## 2013-04-10 DIAGNOSIS — Z79899 Other long term (current) drug therapy: Secondary | ICD-10-CM | POA: Diagnosis not present

## 2013-04-10 DIAGNOSIS — I10 Essential (primary) hypertension: Secondary | ICD-10-CM | POA: Diagnosis not present

## 2013-04-25 ENCOUNTER — Emergency Department (INDEPENDENT_AMBULATORY_CARE_PROVIDER_SITE_OTHER)
Admission: EM | Admit: 2013-04-25 | Discharge: 2013-04-25 | Disposition: A | Discharge status: Nursing Home (Includes ICF) | Payer: Medicare Other | Source: Home / Self Care

## 2013-04-25 ENCOUNTER — Encounter (HOSPITAL_COMMUNITY): Payer: Self-pay | Admitting: Emergency Medicine

## 2013-04-25 ENCOUNTER — Observation Stay (HOSPITAL_COMMUNITY)
Admission: EM | Admit: 2013-04-25 | Discharge: 2013-04-27 | Disposition: A | Discharge status: Home / Self Care | Payer: Medicare Other | Attending: Internal Medicine | Admitting: Internal Medicine

## 2013-04-25 ENCOUNTER — Emergency Department (HOSPITAL_COMMUNITY): Payer: Medicare Other

## 2013-04-25 DIAGNOSIS — Z888 Allergy status to other drugs, medicaments and biological substances status: Secondary | ICD-10-CM | POA: Insufficient documentation

## 2013-04-25 DIAGNOSIS — Z880 Allergy status to penicillin: Secondary | ICD-10-CM | POA: Diagnosis not present

## 2013-04-25 DIAGNOSIS — I10 Essential (primary) hypertension: Secondary | ICD-10-CM

## 2013-04-25 DIAGNOSIS — Z7982 Long term (current) use of aspirin: Secondary | ICD-10-CM | POA: Insufficient documentation

## 2013-04-25 DIAGNOSIS — R0602 Shortness of breath: Secondary | ICD-10-CM | POA: Insufficient documentation

## 2013-04-25 DIAGNOSIS — R12 Heartburn: Secondary | ICD-10-CM

## 2013-04-25 DIAGNOSIS — M549 Dorsalgia, unspecified: Secondary | ICD-10-CM | POA: Diagnosis not present

## 2013-04-25 DIAGNOSIS — R110 Nausea: Secondary | ICD-10-CM | POA: Insufficient documentation

## 2013-04-25 DIAGNOSIS — L74519 Primary focal hyperhidrosis, unspecified: Secondary | ICD-10-CM | POA: Diagnosis not present

## 2013-04-25 DIAGNOSIS — I471 Supraventricular tachycardia: Secondary | ICD-10-CM

## 2013-04-25 DIAGNOSIS — R079 Chest pain, unspecified: Secondary | ICD-10-CM

## 2013-04-25 DIAGNOSIS — R0789 Other chest pain: Principal | ICD-10-CM | POA: Insufficient documentation

## 2013-04-25 DIAGNOSIS — Z9889 Other specified postprocedural states: Secondary | ICD-10-CM | POA: Insufficient documentation

## 2013-04-25 DIAGNOSIS — F411 Generalized anxiety disorder: Secondary | ICD-10-CM | POA: Diagnosis not present

## 2013-04-25 DIAGNOSIS — Z79899 Other long term (current) drug therapy: Secondary | ICD-10-CM | POA: Diagnosis not present

## 2013-04-25 DIAGNOSIS — R112 Nausea with vomiting, unspecified: Secondary | ICD-10-CM | POA: Diagnosis not present

## 2013-04-25 HISTORY — DX: Calculus of kidney: N20.0

## 2013-04-25 LAB — CBC
HCT: 38.1 % (ref 36.0–46.0)
Hemoglobin: 13.7 g/dL (ref 12.0–15.0)
MCH: 31.5 pg (ref 26.0–34.0)
MCHC: 36 g/dL (ref 30.0–36.0)
MCV: 87.6 fL (ref 78.0–100.0)
Platelets: 279 10*3/uL (ref 150–400)
RBC: 4.35 MIL/uL (ref 3.87–5.11)
RDW: 13.9 % (ref 11.5–15.5)
WBC: 11 10*3/uL — ABNORMAL HIGH (ref 4.0–10.5)

## 2013-04-25 LAB — BASIC METABOLIC PANEL
BUN: 15 mg/dL (ref 6–23)
CO2: 23 mEq/L (ref 19–32)
Calcium: 9.9 mg/dL (ref 8.4–10.5)
Chloride: 104 mEq/L (ref 96–112)
Creatinine, Ser: 0.69 mg/dL (ref 0.50–1.10)
GFR calc Af Amer: >90 mL/min (ref >90)
GFR calc non Af Amer: 89 mL/min — ABNORMAL LOW (ref >90)
Glucose, Bld: 101 mg/dL — ABNORMAL HIGH (ref 70–99)
Potassium: 3.9 mEq/L (ref 3.7–5.3)
Sodium: 144 mEq/L (ref 137–147)

## 2013-04-25 LAB — I-STAT TROPONIN, ED: Troponin i, poc: 0.01 ng/mL (ref 0.00–0.08)

## 2013-04-25 LAB — PRO B NATRIURETIC PEPTIDE: Pro B Natriuretic peptide (BNP): 47.5 pg/mL (ref 0–125)

## 2013-04-25 MED ORDER — ASPIRIN 81 MG PO CHEW
324.0000 mg | CHEWABLE_TABLET | Freq: Once | ORAL | Status: AC
  Administered 2013-04-25: 324 mg via ORAL
  Filled 2013-04-25: qty 4

## 2013-04-25 NOTE — ED Provider Notes (Signed)
CSN: [id number]     Arrival date & time 04/25/13  1946 History   First MD Initiated Contact with Patient 04/25/13 2107     Chief Complaint  Patient presents with  . Chest Pain     (Consider location/radiation/quality/duration/timing/severity/associated sxs/prior Treatment) HPI 68 year old female presents with chest pain for the past 4 days. She states it comes and goes, lasting a couple minutes. This happens 4 to 5 times a day. Described as a sharp, severe pain. She also gets pain in her back. She gets associated diaphoresis, nausea, and shortness of breath. All the symptoms resolved with the chest pain. The pain is not made worse or better by exertion or rest. She follows with Dr. Gwenlyn Found for atypical chest pain but states she's not had chest pain like this before. Denies any pleuritic chest pain. No leg swelling. At this moment she's not have any chest pain.  Past Medical History  Diagnosis Date  . Heart palpitations   . Anxiety   . Hypertension   . PSVT (paroxysmal supraventricular tachycardia)   . Atypical chest pain    Past Surgical History  Procedure Laterality Date  . Tubal ligation    . Cardiovascular stress test  06/19/2009    Post stress myocardial perfusion images show a normal pattern of perfusion in all regions. ECG positive for ischemia. Appears to be a "false-positive" ECG stress test.  . Transthoracic echocardiogram  06/19/2009    EF 77%, mild mitral annular calcification, no evidence of mitral valve prolapse on this supine, unprovoked study, no significant mitral valve thickening.   No family history on file. History  Substance Use Topics  . Smoking status: Former Research scientist (life sciences)  . Smokeless tobacco: Not on file  . Alcohol Use: No   OB History   Grav Para Term Preterm Abortions TAB SAB Ect Mult Living                 Review of Systems  Constitutional: Positive for diaphoresis. Negative for fever.  Respiratory: Positive for shortness of breath.   Cardiovascular:  Positive for chest pain. Negative for leg swelling.  Gastrointestinal: Positive for nausea. Negative for abdominal pain.  Musculoskeletal: Positive for back pain.  Neurological: Negative for weakness.  All other systems reviewed and are negative.      Allergies  Penicillins and Zithromax  Home Medications   Current Outpatient Rx  Name  Route  Sig  Dispense  Refill  . albuterol (PROVENTIL HFA;VENTOLIN HFA) 108 (90 BASE) MCG/ACT inhaler   Inhalation   Inhale 2 puffs into the lungs every 4 (four) hours as needed for wheezing or shortness of breath.   1 Inhaler   0   . Ascorbic Acid (VITAMIN C PO)   Oral   Take 1 tablet by mouth daily.         [name] aspirin 81 MG tablet   Oral   Take 81 mg by mouth daily.         [name] CALCIUM CITRATE PO   Oral   Take 1 tablet by mouth daily.          [name] doxycycline (VIBRAMYCIN) 100 MG capsule   Oral   Take 100 mg by mouth 2 (two) times daily. 10 day course. Started on 03/15/13.         [name] ipratropium (ATROVENT HFA) 17 MCG/ACT inhaler   Inhalation   Inhale 2 puffs into the lungs every 4 (four) hours as needed for wheezing.   1 Inhaler   0   .  metoprolol succinate (TOPROL-XL) 25 MG 24 hr tablet   Oral   Take 25 mg by mouth daily.         . Multiple Vitamins-Minerals (CENTRUM SILVER PO)   Oral   Take 1 tablet by mouth daily.          [name] OVER THE COUNTER MEDICATION   Oral   Take 1 tablet by mouth daily. "Mega Red"          BP 154/69  Pulse 59  Temp(Src) 98 F (36.7 C) (Oral)  Resp 17  SpO2 97% Physical Exam  Nursing note and vitals reviewed. Constitutional: She is oriented to person, place, and time. She appears well-developed and well-nourished. No distress.  HENT:  Head: Normocephalic and atraumatic.  Right Ear: External ear normal.  Left Ear: External ear normal.  Nose: Nose normal.  Eyes: Right eye exhibits no discharge. Left eye exhibits no discharge.  Cardiovascular: Normal rate, regular rhythm and normal  heart sounds.   Pulmonary/Chest: Effort normal and breath sounds normal. She exhibits no tenderness.  Abdominal: Soft. She exhibits no distension. There is no tenderness.  Musculoskeletal: She exhibits no edema.  Neurological: She is alert and oriented to person, place, and time.  Skin: Skin is warm and dry.    ED Course  Procedures (including critical care time) Labs Review Labs Reviewed  CBC - Abnormal; Notable for the following:    WBC 11.0 (*)    All other components within normal limits  BASIC METABOLIC PANEL - Abnormal; Notable for the following:    Glucose, Bld 101 (*)    GFR calc non Af Amer 89 (*)    All other components within normal limits  PRO B NATRIURETIC PEPTIDE  I-STAT TROPOININ, ED   Imaging Review Dg Chest 2 View  04/25/2013   CLINICAL DATA:  Chest pain and shortness of breath  EXAM: CHEST  2 VIEW  COMPARISON:  04/09/2013  FINDINGS: The heart size and mediastinal contours are within normal limits. Both lungs are clear. The visualized skeletal structures are unremarkable.  IMPRESSION: No active cardiopulmonary disease.   Electronically Signed   By: Daryll Brod M.D.   On: 04/25/2013 21:17     EKG Interpretation   Date/Time:  Tuesday April 25 2013 21:53:40 EDT Ventricular Rate:  60 PR Interval:  207 QRS Duration: 85 QT Interval:  393 QTC Calculation: 393 R Axis:   67 Text Interpretation:  Sinus rhythm Anteroseptal infarct, old No  significant change since last tracing Confirmed by [place]  ([id number]) on 04/25/2013 11:02:28 PM      MDM   Final diagnoses:  Chest pain    Patient is well-appearing here, has no current symptoms. She has a diagnosis of atypical chest pain along with negative stress testing 4 years ago, however she seems to think these symptoms are new. They are concerning with her having diaphoresis and shortness of breath when she has a chest pain. Her EKG is nonischemic, but she was not having pain at that time. She was given  aspirin cardiology was consulted and she will be admitted to the hospitalist for overnight observation on telemetry.    Ephraim Hamburger, MD 04/25/13 [phone]

## 2013-04-25 NOTE — ED Notes (Signed)
Pt st's she has had chest pain off and on x's 2-3 days.  St's pain is sharpe in nature.

## 2013-04-25 NOTE — ED Provider Notes (Signed)
Medical screening examination/treatment/procedure(s) were performed by non-physician practitioner and as supervising physician I was immediately available for consultation/collaboration.  Philipp Deputy, M.D.  Harden Mo, MD 04/25/13 [phone]

## 2013-04-25 NOTE — H&P (Signed)
[facility] History and Physical  Brittany Harvey GLO:[id number] DOB: 1945-02-13 DOA: 04/25/2013  Referring physician: Sherwood Gambler, MD PCP: Salena Saner., MD   Chief Complaint: Chest Pain  HPI: Brittany Harvey is a 68 y.o. female present to the ED with Chest Pain. She states this has been going on for several days now. She seems to think it is worse when she lays down. She states it is sharp pain goes through to her back. She has been having some nausea and some diaphoresis along with the pain. Patient states that she has been seen in the past by Dr Gwenlyn Found and has had this pain evaluated with a stress test which was normal. She also did recently see Dr Gwenlyn Found in March for similar complaints. Patient states that she took some aspirin today seems to have helped. She is not sure about relation to food.    Review of Systems:  Constitutional:  No weight loss, night sweats, Fevers, chills, fatigue.  HEENT:  No headaches, Difficulty swallowing,Tooth/dental problems,Sore throat,  No sneezing, itching, ear ache, nasal congestion, post nasal drip,  Cardio-vascular:  ++ chest pain, Orthopnea, PND, no swelling in lower extremities, anasarca, dizziness, palpitations  GI:  ++ heartburn, ++indigestion, no nausea, vomiting, diarrhea, change in bowel habits, loss of appetite  Resp:  No shortness of breath with exertion or at rest. No excess mucus, no productive cough, No non-productive cough, No coughing up of blood.No change in color of mucus.No wheezing.No chest wall deformity  Skin:  no rash or lesions.  GU:  no dysuria, change in color of urine, no urgency or frequency. No flank pain.  Musculoskeletal:  No joint pain or swelling. No decreased range of motion. No back pain.  Psych:  No change in mood or affect. No depression or anxiety. No memory loss.   Past Medical History  Diagnosis Date  . Heart palpitations   . Anxiety   . Hypertension   . PSVT (paroxysmal supraventricular  tachycardia)   . Atypical chest pain    Past Surgical History  Procedure Laterality Date  . Tubal ligation    . Cardiovascular stress test  06/19/2009    Post stress myocardial perfusion images show a normal pattern of perfusion in all regions. ECG positive for ischemia. Appears to be a "false-positive" ECG stress test.  . Transthoracic echocardiogram  06/19/2009    EF 77%, mild mitral annular calcification, no evidence of mitral valve prolapse on this supine, unprovoked study, no significant mitral valve thickening.   Social History:  reports that she has quit smoking. She does not have any smokeless tobacco history on file. She reports that she does not drink alcohol or use illicit drugs.  Allergies  Allergen Reactions  . Penicillins Anaphylaxis and Swelling  . Zithromax [Azithromycin]     presyncope    No family history on file.   Prior to Admission medications   Medication Sig Start Date End Date Taking? Authorizing Provider  albuterol (PROVENTIL HFA;VENTOLIN HFA) 108 (90 BASE) MCG/ACT inhaler Inhale 2 puffs into the lungs every 4 (four) hours as needed for wheezing or shortness of breath. 03/18/13  Yes Merryl Hacker, MD  Ascorbic Acid (VITAMIN C PO) Take 1 tablet by mouth daily.   Yes Historical Provider, MD  aspirin 81 MG tablet Take 81 mg by mouth daily.   Yes Historical Provider, MD  CALCIUM CITRATE PO Take 1 tablet by mouth daily.    Yes Historical Provider, MD  doxycycline (VIBRAMYCIN) 100 MG capsule  Take 100 mg by mouth 2 (two) times daily. 10 day course. Started on 03/15/13.   Yes Historical Provider, MD  ipratropium (ATROVENT HFA) 17 MCG/ACT inhaler Inhale 2 puffs into the lungs every 4 (four) hours as needed for wheezing. 03/18/13  Yes Merryl Hacker, MD  metoprolol succinate (TOPROL-XL) 25 MG 24 hr tablet Take 25 mg by mouth daily.   Yes Historical Provider, MD  Multiple Vitamins-Minerals (CENTRUM SILVER PO) Take 1 tablet by mouth daily.    Yes Historical Provider, MD    OVER THE COUNTER MEDICATION Take 1 tablet by mouth daily. "Mega Red"   Yes Historical Provider, MD   Physical Exam: Filed Vitals:   04/25/13 2100  BP: 154/69  Pulse: 59  Temp:   Resp: 17    BP 154/69  Pulse 59  Temp(Src) 98 F (36.7 C) (Oral)  Resp 17  SpO2 97%  General:  Appears calm and comfortable Eyes: PERRL, normal lids, irises & conjunctiva ENT: grossly normal hearing, lips & tongue Neck: no LAD, masses or thyromegaly Cardiovascular: RRR, no m/r/g. No LE edema. Telemetry: SR, no arrhythmias  Respiratory: CTA bilaterally, no w/r/r. Normal respiratory effort. Abdomen: soft, slight epigastric area pain Skin: no rash or induration seen on limited exam Musculoskeletal: grossly normal tone BUE/BLE Psychiatric: grossly normal mood and affect, speech fluent and appropriate Neurologic: grossly non-focal.          Labs on Admission:  Basic Metabolic Panel:  Recent Labs Lab 04/25/13 2021  NA 144  K 3.9  CL 104  CO2 23  GLUCOSE 101*  BUN 15  CREATININE 0.69  CALCIUM 9.9   Liver Function Tests: No results found for this basename: AST, ALT, ALKPHOS, BILITOT, PROT, ALBUMIN,  in the last 168 hours No results found for this basename: LIPASE, AMYLASE,  in the last 168 hours No results found for this basename: AMMONIA,  in the last 168 hours CBC:  Recent Labs Lab 04/25/13 2021  WBC 11.0*  HGB 13.7  HCT 38.1  MCV 87.6  PLT 279   Cardiac Enzymes: No results found for this basename: CKTOTAL, CKMB, CKMBINDEX, TROPONINI,  in the last 168 hours  BNP (last 3 results)  Recent Labs  03/16/13 1250 04/25/13 2021  PROBNP 64.5 47.5   CBG: No results found for this basename: GLUCAP,  in the last 168 hours  Radiological Exams on Admission: Dg Chest 2 View  04/25/2013   CLINICAL DATA:  Chest pain and shortness of breath  EXAM: CHEST  2 VIEW  COMPARISON:  04/09/2013  FINDINGS: The heart size and mediastinal contours are within normal limits. Both lungs are clear.  The visualized skeletal structures are unremarkable.  IMPRESSION: No active cardiopulmonary disease.   Electronically Signed   By: Daryll Brod M.D.   On: 04/25/2013 21:17    EKG: Independently reviewed. NSR no Acute changes  Assessment/Plan Active Problems:   Chest pain   Heartburn   Hypertension   1. Chest Pain -has history of atypical pain in the past with negative stress test -will admit for observation -her BP was noted to be elevated will place nitropaste to chest wall -get cardiology consult  2. Heartburn -will give GI cocktail -consider PPIs  3. Hypertension -pressure appears to be slightly elevated in ED -will continue with meds and add nitropaste  Code Status: Full Code (must indicate code status--if unknown or must be presumed, indicate so) Family Communication: No family (indicate person spoken with, if applicable, with phone number if by  telephone) Disposition Plan: HOme (indicate anticipated LOS)  Time spent: 32min  KHAN,SAADAT A [facility] Pager [phone]

## 2013-04-25 NOTE — ED Notes (Signed)
Patient complains of having chest pain past four days Pain radiates to her left shoulder blade Has been having some SOB and sweating  States feels different than indigestion

## 2013-04-25 NOTE — ED Notes (Addendum)
Pt. transferred from [facility] due to mid chest pain radiating to left lower breast and upper back with slight SOB and nausea onset 4 days ago .

## 2013-04-25 NOTE — ED Provider Notes (Signed)
CSN: [id number]     Arrival date & time 04/25/13  1807 History   First MD Initiated Contact with Patient 04/25/13 1834     Chief Complaint  Patient presents with  . Chest Pain   (Consider location/radiation/quality/duration/timing/severity/associated sxs/prior Treatment) HPI Comments: 68 year old female presents with a four-day history of mid chest pain that is now radiating to her back. She describes it as a sharp pain in mostly constant. It occurs most hours during the day. Nothing seems to make it better but occasionally taking deep breath makes it worse. It occurs with rest and exertion. Today she had an episode of diaphoresis and weakness associated with the chest pain. In addition she complains of burning in the retrosternal area. She states that she has never had this type of chest pain before. She has a history of atypical chest pain but states this is different for her. I was not able to locate imaging or other procedures  objectively demonstrates the presence or absence of CAD. She does have a history of SVT and palpitations.   Past Medical History  Diagnosis Date  . Heart palpitations   . Anxiety   . Hypertension   . PSVT (paroxysmal supraventricular tachycardia)   . Atypical chest pain    Past Surgical History  Procedure Laterality Date  . Tubal ligation    . Cardiovascular stress test  06/19/2009    Post stress myocardial perfusion images show a normal pattern of perfusion in all regions. ECG positive for ischemia. Appears to be a "false-positive" ECG stress test.  . Transthoracic echocardiogram  06/19/2009    EF 77%, mild mitral annular calcification, no evidence of mitral valve prolapse on this supine, unprovoked study, no significant mitral valve thickening.   No family history on file. History  Substance Use Topics  . Smoking status: Former Research scientist (life sciences)  . Smokeless tobacco: Not on file  . Alcohol Use: No   OB History   Grav Para Term Preterm Abortions TAB SAB Ect Mult  Living                 Review of Systems  Constitutional: Positive for diaphoresis. Negative for fever.  HENT: Negative.   Respiratory: Negative for shortness of breath and wheezing.   Cardiovascular: Positive for chest pain and palpitations. Negative for leg swelling.  Gastrointestinal: Positive for nausea. Negative for vomiting.  Genitourinary: Negative.   Skin: Negative for color change.  Neurological: Positive for weakness and light-headedness. Negative for syncope.  Psychiatric/Behavioral: The patient is nervous/anxious.     Allergies  Penicillins and Zithromax  Home Medications   Current Outpatient Rx  Name  Route  Sig  Dispense  Refill  . albuterol (PROVENTIL HFA;VENTOLIN HFA) 108 (90 BASE) MCG/ACT inhaler   Inhalation   Inhale 2 puffs into the lungs every 4 (four) hours as needed for wheezing or shortness of breath.   1 Inhaler   0   . Ascorbic Acid (VITAMIN C PO)   Oral   Take 1 tablet by mouth daily.         [name] aspirin 81 MG tablet   Oral   Take 81 mg by mouth daily.         [name] CALCIUM CITRATE PO   Oral   Take 1 tablet by mouth daily.          [name] doxycycline (VIBRAMYCIN) 100 MG capsule   Oral   Take 100 mg by mouth 2 (two) times daily. 10 day course. Started on 03/15/13.         [name]  ipratropium (ATROVENT HFA) 17 MCG/ACT inhaler   Inhalation   Inhale 2 puffs into the lungs every 4 (four) hours as needed for wheezing.   1 Inhaler   0   . metoprolol succinate (TOPROL-XL) 25 MG 24 hr tablet   Oral   Take 25 mg by mouth daily.         . Multiple Vitamins-Minerals (CENTRUM SILVER PO)   Oral   Take 1 tablet by mouth daily.          [name] OVER THE COUNTER MEDICATION   Oral   Take 1 tablet by mouth daily. "Mega Red"          BP 172/64  Pulse 78  Temp(Src) 98.8 F (37.1 C) (Oral)  Resp 16  SpO2 97% Physical Exam  Nursing note and vitals reviewed. Constitutional: She is oriented to person, place, and time. She appears well-developed and  well-nourished. No distress.  Appears generally well, no distress, relaxed posturing sitting in chair during HPI. No current chest pain.  HENT:  Mouth/Throat: Oropharynx is clear and moist.  Eyes: Conjunctivae and EOM are normal.  Neck: Normal range of motion. Neck supple.  Cardiovascular: Normal rate, regular rhythm, normal heart sounds and intact distal pulses.   Pulmonary/Chest: Effort normal and breath sounds normal. No respiratory distress. She has no wheezes. She has no rales.  Musculoskeletal: She exhibits no edema.  Lymphadenopathy:    She has no cervical adenopathy.  Neurological: She is alert and oriented to person, place, and time. She exhibits normal muscle tone.  Skin: Skin is warm and dry. No rash noted.  Psychiatric: She has a normal mood and affect.    ED Course  Procedures (including critical care time) Labs Review Labs Reviewed - No data to display Imaging Review No results found. EKG. NSR, No ischemic changes, no ectopy.   MDM   1. Chest pain      Transfer to [facility] for chest pain evaluation Pt is stable at this time. Her hx is complicated by anxiety and grief reaction related to the loss of her husband in recent months.  She has a hx of atypical chest pain thought not to be cardiac. Pt st these 4 days are different than previous as she has mid back pain, L shoulder pain, sweating and a different type of pain from previous episodes.  Recent nl 2D ECHO but no other testing seen in history. EKG today WNL.    Janne Napoleon, NP 04/25/13 1920

## 2013-04-26 ENCOUNTER — Observation Stay (HOSPITAL_COMMUNITY): Payer: Medicare Other

## 2013-04-26 ENCOUNTER — Encounter (HOSPITAL_COMMUNITY): Payer: Self-pay | Admitting: Anesthesiology

## 2013-04-26 DIAGNOSIS — F411 Generalized anxiety disorder: Secondary | ICD-10-CM | POA: Diagnosis not present

## 2013-04-26 DIAGNOSIS — L74519 Primary focal hyperhidrosis, unspecified: Secondary | ICD-10-CM | POA: Diagnosis not present

## 2013-04-26 DIAGNOSIS — I10 Essential (primary) hypertension: Secondary | ICD-10-CM

## 2013-04-26 DIAGNOSIS — R0789 Other chest pain: Secondary | ICD-10-CM | POA: Diagnosis not present

## 2013-04-26 DIAGNOSIS — R079 Chest pain, unspecified: Secondary | ICD-10-CM

## 2013-04-26 LAB — CBC
HCT: 36.7 % (ref 36.0–46.0)
Hemoglobin: 12.8 g/dL (ref 12.0–15.0)
MCH: 30.5 pg (ref 26.0–34.0)
MCHC: 34.9 g/dL (ref 30.0–36.0)
MCV: 87.4 fL (ref 78.0–100.0)
Platelets: 237 10*3/uL (ref 150–400)
RBC: 4.2 MIL/uL (ref 3.87–5.11)
RDW: 13.7 % (ref 11.5–15.5)
WBC: 8.8 10*3/uL (ref 4.0–10.5)

## 2013-04-26 LAB — LIPID PANEL
Cholesterol: 194 mg/dL (ref 0–200)
HDL: 89 mg/dL (ref >39)
LDL Cholesterol: 92 mg/dL (ref 0–99)
Total CHOL/HDL Ratio: 2.2 RATIO
Triglycerides: 66 mg/dL (ref <150)
VLDL: 13 mg/dL (ref 0–40)

## 2013-04-26 LAB — TROPONIN I
Troponin I: <0.3 ng/mL (ref <0.30)
Troponin I: <0.3 ng/mL (ref <0.30)
Troponin I: <0.3 ng/mL (ref <0.30)

## 2013-04-26 LAB — CREATININE, SERUM
Creatinine, Ser: 0.67 mg/dL (ref 0.50–1.10)
GFR calc Af Amer: >90 mL/min (ref >90)
GFR calc non Af Amer: 90 mL/min — ABNORMAL LOW (ref >90)

## 2013-04-26 MED ORDER — ASPIRIN 81 MG PO CHEW
81.0000 mg | CHEWABLE_TABLET | Freq: Every day | ORAL | Status: DC
  Administered 2013-04-26: 81 mg via ORAL
  Filled 2013-04-26 (×2): qty 1

## 2013-04-26 MED ORDER — GI COCKTAIL ~~LOC~~: 30.0000 mL | Freq: Four times a day (QID) | ORAL | Status: DC | PRN

## 2013-04-26 MED ORDER — SODIUM CHLORIDE 0.9 % IV SOLN
INTRAVENOUS | Status: DC
  Administered 2013-04-26: 02:00:00 via INTRAVENOUS

## 2013-04-26 MED ORDER — PANTOPRAZOLE SODIUM 40 MG PO TBEC: 40.0000 mg | DELAYED_RELEASE_TABLET | Freq: Every day | ORAL | Status: DC

## 2013-04-26 MED ORDER — NITROGLYCERIN 2 % TD OINT
1.0000 [in_us] | TOPICAL_OINTMENT | Freq: Four times a day (QID) | TRANSDERMAL | Status: DC
  Filled 2013-04-26: qty 30

## 2013-04-26 MED ORDER — GADOBENATE DIMEGLUMINE 529 MG/ML IV SOLN
25.0000 mL | Freq: Once | INTRAVENOUS | Status: AC
  Administered 2013-04-26: 25 mL via INTRAVENOUS

## 2013-04-26 MED ORDER — PANTOPRAZOLE SODIUM 20 MG PO TBEC: 20.0000 mg | DELAYED_RELEASE_TABLET | Freq: Every day | ORAL | Status: DC

## 2013-04-26 MED ORDER — IPRATROPIUM BROMIDE 0.02 % IN SOLN
RESPIRATORY_TRACT | Status: AC
  Filled 2013-04-26: qty 2.5

## 2013-04-26 MED ORDER — ALBUTEROL SULFATE (2.5 MG/3ML) 0.083% IN NEBU: 2.5000 mg | INHALATION_SOLUTION | RESPIRATORY_TRACT | Status: DC | PRN

## 2013-04-26 MED ORDER — HEPARIN SODIUM (PORCINE) 5000 UNIT/ML IJ SOLN
5000.0000 [IU] | Freq: Three times a day (TID) | INTRAMUSCULAR | Status: DC
  Filled 2013-04-26 (×7): qty 1

## 2013-04-26 MED ORDER — METOPROLOL SUCCINATE ER 25 MG PO TB24
25.0000 mg | ORAL_TABLET | Freq: Every day | ORAL | Status: DC
  Administered 2013-04-26: 25 mg via ORAL
  Filled 2013-04-26 (×2): qty 1

## 2013-04-26 MED ORDER — DOXYCYCLINE HYCLATE 100 MG PO CAPS: 100.0000 mg | ORAL_CAPSULE | Freq: Two times a day (BID) | ORAL | Status: DC

## 2013-04-26 MED ORDER — REGADENOSON 0.4 MG/5ML IV SOLN
0.4000 mg | Freq: Once | INTRAVENOUS | Status: DC
  Filled 2013-04-26 (×2): qty 5

## 2013-04-26 MED ORDER — ADULT MULTIVITAMIN W/MINERALS CH
1.0000 | ORAL_TABLET | Freq: Every morning | ORAL | Status: DC
  Administered 2013-04-26: 1 via ORAL
  Filled 2013-04-26 (×2): qty 1

## 2013-04-26 MED ORDER — PANTOPRAZOLE SODIUM 40 MG PO TBEC
40.0000 mg | DELAYED_RELEASE_TABLET | Freq: Every day | ORAL | Status: DC
  Administered 2013-04-26: 40 mg via ORAL
  Filled 2013-04-26: qty 1

## 2013-04-26 MED ORDER — MORPHINE SULFATE 2 MG/ML IJ SOLN: 2.0000 mg | INTRAMUSCULAR | Status: DC | PRN

## 2013-04-26 MED ORDER — IPRATROPIUM BROMIDE 0.02 % IN SOLN
0.5000 mg | Freq: Four times a day (QID) | RESPIRATORY_TRACT | Status: DC
  Administered 2013-04-26 (×3): 0.5 mg via RESPIRATORY_TRACT
  Filled 2013-04-26 (×3): qty 2.5

## 2013-04-26 MED ORDER — LORAZEPAM 0.5 MG PO TABS
0.5000 mg | ORAL_TABLET | ORAL | Status: DC | PRN
  Administered 2013-04-26: 0.5 mg via ORAL
  Filled 2013-04-26: qty 1

## 2013-04-26 NOTE — Progress Notes (Signed)
UR completed 

## 2013-04-26 NOTE — ED Notes (Signed)
Pt ambulatory to bathroom without any problems 

## 2013-04-26 NOTE — Discharge Instructions (Signed)
Follow with Primary MD Salena Saner., MD in 7 days   Get CBC, CMP, checked 7 days by Primary MD and again as instructed by your Primary MD. .   Activity: As tolerated with Full fall precautions use walker/cane & assistance as needed   Disposition Home     Diet: Heart Healthy   For Heart failure patients - Check your Weight same time everyday, if you gain over 2 pounds, or you develop in leg swelling, experience more shortness of breath or chest pain, call your Primary MD immediately. Follow Cardiac Low Salt Diet and 1.8 lit/day fluid restriction.   On your next visit with her primary care physician please Get Medicines reviewed and adjusted.  Please request your Prim.MD to go over all Hospital Tests and Procedure/Radiological results at the follow up, please get all Hospital records sent to your Prim MD by signing hospital release before you go home.   If you experience worsening of your admission symptoms, develop shortness of breath, life threatening emergency, suicidal or homicidal thoughts you must seek medical attention immediately by calling 911 or calling your MD immediately  if symptoms less severe.  You Must read complete instructions/literature along with all the possible adverse reactions/side effects for all the Medicines you take and that have been prescribed to you. Take any new Medicines after you have completely understood and accpet all the possible adverse reactions/side effects.   Do not drive and provide baby sitting services if your were admitted for syncope or siezures until you have seen by Primary MD or a Neurologist and advised to do so again.  Do not drive when taking Pain medications.    Do not take more than prescribed Pain, Sleep and Anxiety Medications  Special Instructions: If you have smoked or chewed Tobacco  in the last 2 yrs please stop smoking, stop any regular Alcohol  and or any Recreational drug use.  Wear Seat belts while  driving.   Please note  You were cared for by a hospitalist during your hospital stay. If you have any questions about your discharge medications or the care you received while you were in the hospital after you are discharged, you can call the unit and asked to speak with the hospitalist on call if the hospitalist that took care of you is not available. Once you are discharged, your primary care physician will handle any further medical issues. Please note that NO REFILLS for any discharge medications will be authorized once you are discharged, as it is imperative that you return to your primary care physician (or establish a relationship with a primary care physician if you do not have one) for your aftercare needs so that they can reassess your need for medications and monitor your lab values.

## 2013-04-26 NOTE — Consult Note (Addendum)
CARDIOLOGY CONSULT NOTE   Patient ID: Brittany Harvey MRN: [id number], DOB/AGE: [birth year]   Admit date: 04/25/2013 Date of Consult: 04/26/2013   Primary Physician: Salena Saner., MD Primary Cardiologist: Dr Gwenlyn Found  Pt. Profile  Chest pain   Problem List  Past Medical History  Diagnosis Date  . Heart palpitations   . Anxiety   . Hypertension   . PSVT (paroxysmal supraventricular tachycardia)   . Atypical chest pain   . Kidney stones     Past Surgical History  Procedure Laterality Date  . Tubal ligation    . Cardiovascular stress test  06/19/2009    Post stress myocardial perfusion images show a normal pattern of perfusion in all regions. ECG positive for ischemia. Appears to be a "false-positive" ECG stress test.     Allergies  Allergies  Allergen Reactions  . Penicillins Anaphylaxis and Swelling  . Tussin Dm [Guaifenesin-Dm] Other (See Comments)    Dizziness/ Lightheadness  . Zithromax [Azithromycin]     presyncope    HPI   A very pleasant 68 year old female who presented with several days lasting intermittent lower retrosternal chest pain radiating to her left epigastrium and beck. The pain is worse at night, sometimes after food and non-exertional. She was followed by Dr Gwenlyn Found in the past and underwent a stress test in 2009 that was negative.  She is currently pain free.  She states that ocassionaly she will wake up at night feeling SOB and sitting in upright position helps. She denies recent fevers or chills. Some of her chest pain episodes are associated with diaphoresis, no syncope.    Inpatient Medications  . aspirin  81 mg Oral Daily  . heparin  5,000 Units Subcutaneous 3 times per day  . ipratropium  0.5 mg Inhalation QID  . metoprolol succinate  25 mg Oral Daily  . multivitamin with minerals  1 tablet Oral q morning - 10a  . nitroGLYCERIN  1 inch Topical 4 times per day  . pantoprazole  40 mg Oral Daily    Family History History reviewed. No  pertinent family history.   Social History History   Social History  . Marital Status: Married    Spouse Name: [name]    Number of Children: [name]  . Years of Education: [name]   Occupational History  . Not on file.   Social History Main Topics  . Smoking status: Former Research scientist (life sciences)  . Smokeless tobacco: Not on file  . Alcohol Use: No  . Drug Use: No  . Sexual Activity: Not on file   Other Topics Concern  . Not on file   Social History Narrative  . No narrative on file     Review of Systems  General:  No chills, fever, night sweats or weight changes.  Cardiovascular:  No chest pain, dyspnea on exertion, edema, orthopnea, palpitations, paroxysmal nocturnal dyspnea. Dermatological: No rash, lesions/masses Respiratory: No cough, dyspnea Urologic: No hematuria, dysuria Abdominal:   No nausea, vomiting, diarrhea, bright red blood per rectum, melena, or hematemesis Neurologic:  No visual changes, wkns, changes in mental status. All other systems reviewed and are otherwise negative except as noted above.  Physical Exam  Blood pressure 128/64, pulse 63, temperature 97.8 F (36.6 C), temperature source Oral, resp. rate 18, height 5\' 1"  (1.549 m), SpO2 96.00%.  General: Pleasant, NAD Psych: Normal affect. Neuro: Alert and oriented X 3. Moves all extremities spontaneously. HEENT: Normal  Neck: Supple without bruits or JVD. Lungs:  Resp regular  and unlabored, CTA. Heart: RRR no s3, s4, or murmurs. Abdomen: Soft, non-tender, non-distended, BS + x 4.  Extremities: No clubbing, cyanosis or edema. DP/PT/Radials 2+ and equal bilaterally.  Labs   Recent Labs  04/26/13 0131 04/26/13 0710  TROPONINI <0.30 <0.30   Lab Results  Component Value Date   WBC 8.8 04/26/2013   HGB 12.8 04/26/2013   HCT 36.7 04/26/2013   MCV 87.4 04/26/2013   PLT 237 04/26/2013    Recent Labs Lab 04/25/13 2021 04/26/13 0131  NA 144  --   K 3.9  --   CL 104  --   CO2 23  --   BUN 15  --   CREATININE 0.69  0.67  CALCIUM 9.9  --   GLUCOSE 101*  --    Radiology/Studies  Dg Chest 2 View  04/25/2013   CLINICAL DATA:  Chest pain and shortness of breath  EXAM: CHEST  2 VIEW  COMPARISON:  04/09/2013  FINDINGS: The heart size and mediastinal contours are within normal limits. Both lungs are clear. The visualized skeletal structures are unremarkable.  IMPRESSION: No active cardiopulmonary disease.   Electronically Signed   By: Daryll Brod M.D.   On: 04/25/2013 21:17   Echocardiogram - none  ECG: 1.AVB, otherwise normal ECG  Telemetry: SB   ASSESSMENT AND PLAN  Atypical chest pain, the differential includes ischemia, pericarditis or GERD. We will perform a cardiac MRI that will include stress test with Lexiscan (avoid caffeine) and evaluation of pericarditis, as well as LVEF, wall motion abnormalities and amount of pericardial effusion.  We will start her on PPIs.   BP - well controlled  Lipids - not known, we will check   Signed, Dorothy Spark, MD, [facility] 04/26/2013, 8:59 AM

## 2013-04-26 NOTE — Discharge Summary (Signed)
Brittany Harvey, is a 68 y.o. female  DOB 05-01-1945  MRN [id number].  Admission date:  04/25/2013  Admitting Physician  Allyne Gee, MD  Discharge Date:  04/26/2013   Primary MD  Salena Saner., MD  Recommendations for primary care physician for things to follow:   Follow medically, outpatient GI and cardiology followup   Admission Diagnosis  Chest pain [786.50]   Discharge Diagnosis  Chest pain [786.50]  Likely GERD related  Active Problems:   Chest pain   Heartburn   Hypertension      Past Medical History  Diagnosis Date  . Heart palpitations   . Anxiety   . Hypertension   . PSVT (paroxysmal supraventricular tachycardia)   . Atypical chest pain   . Kidney stones     Past Surgical History  Procedure Laterality Date  . Tubal ligation    . Cardiovascular stress test  06/19/2009    Post stress myocardial perfusion images show a normal pattern of perfusion in all regions. ECG positive for ischemia. Appears to be a "false-positive" ECG stress test.     Discharge Condition: stable   Follow UP  Follow-up Information   Follow up with Salena Saner., MD. Schedule an appointment as soon as possible for a visit in 1 week.   Specialty:  Internal Medicine   Contact information:   [address] [place] [address] [phone]       Follow up with Dorothy Spark, MD. Schedule an appointment as soon as possible for a visit in 1 week.   Specialty:  Cardiology   Contact information:   [place] [address] [phone]       Follow up with Silvano Rusk, MD. Schedule an appointment as soon as possible for a visit in 1 week.   Specialty:  Gastroenterology   Contact information:   [address] [address] [phone]         Discharge  Instructions  and  Discharge Medications          Discharge Orders   Future Orders Complete By Expires   Diet - low sodium heart healthy  As directed    Discharge instructions  As directed    Comments:     Follow with Primary MD Salena Saner., MD in 7 days   Get CBC, CMP, checked 7 days by Primary MD and again as instructed by your Primary MD. .   Activity: As tolerated with Full fall precautions use walker/cane & assistance as needed   Disposition Home     Diet: Heart Healthy   For Heart failure patients - Check your Weight same time everyday, if you gain over 2 pounds, or you develop in leg swelling, experience more shortness of breath or chest pain, call your Primary MD immediately. Follow Cardiac Low Salt Diet and 1.8 lit/day fluid restriction.   On your next visit with her primary care physician please Get Medicines reviewed and adjusted.  Please request  your Prim.MD to go over all Hospital Tests and Procedure/Radiological results at the follow up, please get all Hospital records sent to your Prim MD by signing hospital release before you go home.   If you experience worsening of your admission symptoms, develop shortness of breath, life threatening emergency, suicidal or homicidal thoughts you must seek medical attention immediately by calling 911 or calling your MD immediately  if symptoms less severe.  You Must read complete instructions/literature along with all the possible adverse reactions/side effects for all the Medicines you take and that have been prescribed to you. Take any new Medicines after you have completely understood and accpet all the possible adverse reactions/side effects.   Do not drive and provide baby sitting services if your were admitted for syncope or siezures until you have seen by Primary MD or a Neurologist and advised to do so again.  Do not drive when taking Pain medications.    Do not take more than prescribed Pain, Sleep and  Anxiety Medications  Special Instructions: If you have smoked or chewed Tobacco  in the last 2 yrs please stop smoking, stop any regular Alcohol  and or any Recreational drug use.  Wear Seat belts while driving.   Please note  You were cared for by a hospitalist during your hospital stay. If you have any questions about your discharge medications or the care you received while you were in the hospital after you are discharged, you can call the unit and asked to speak with the hospitalist on call if the hospitalist that took care of you is not available. Once you are discharged, your primary care physician will handle any further medical issues. Please note that NO REFILLS for any discharge medications will be authorized once you are discharged, as it is imperative that you return to your primary care physician (or establish a relationship with a primary care physician if you do not have one) for your aftercare needs so that they can reassess your need for medications and monitor your lab values.   Increase activity slowly  As directed        Medication List         albuterol 108 (90 BASE) MCG/ACT inhaler  Commonly known as:  PROVENTIL HFA;VENTOLIN HFA  Inhale 2 puffs into the lungs every 4 (four) hours as needed for wheezing or shortness of breath.     aspirin 81 MG tablet  Take 81 mg by mouth daily.     CALCIUM CITRATE PO  Take 1 tablet by mouth daily.     CENTRUM SILVER PO  Take 1 tablet by mouth daily.     ipratropium 17 MCG/ACT inhaler  Commonly known as:  ATROVENT HFA  Inhale 2 puffs into the lungs every 4 (four) hours as needed for wheezing.     metoprolol succinate 25 MG 24 hr tablet  Commonly known as:  TOPROL-XL  Take 25 mg by mouth daily.     OVER THE COUNTER MEDICATION  Take 1 tablet by mouth daily. "Mega Red"     pantoprazole 40 MG tablet  Commonly known as:  PROTONIX  Take 1 tablet (40 mg total) by mouth daily. Switch for any other PPI at similar dose and  frequency     VITAMIN C PO  Take 1 tablet by mouth daily.          Diet and Activity recommendation: See Discharge Instructions above   Consults obtained - Cards   Major procedures and Radiology  Reports - PLEASE review detailed and final reports for all details, in brief -    Cardiac MRI - Lexiscan    IMPRESSION:  1. Normal biventricular size and function. Mild concentric LVH. No wall motion abnormalities.  2. No rest or stress induced perfusion defect. No evidence of scar or ischemia by Lexiscan stress test.  3. No evidence of pericarditis.  4. Normal cardiac MRI, consider non-cardiac causes of chest pain.     Dg Chest 2 View  04/25/2013   CLINICAL DATA:  Chest pain and shortness of breath  EXAM: CHEST  2 VIEW  COMPARISON:  04/09/2013  FINDINGS: The heart size and mediastinal contours are within normal limits. Both lungs are clear. The visualized skeletal structures are unremarkable.  IMPRESSION: No active cardiopulmonary disease.   Electronically Signed   By: Daryll Brod M.D.   On: 04/25/2013 21:17   Dg Chest 2 View  04/09/2013   CLINICAL DATA:  Persistent cough and shortness of breath.  EXAM: CHEST  2 VIEW  COMPARISON:  03/18/2013  FINDINGS: The heart size and mediastinal contours are within normal limits. Both lungs are clear. The visualized skeletal structures are unremarkable.  IMPRESSION: Normal exam.   Electronically Signed   By: Rozetta Nunnery M.D.   On: 04/09/2013 19:48    Micro Results      No results found for this or any previous visit (from the past 240 hour(s)).   History of present illness and  Hospital Course:     Kindly see H&P for history of present illness and admission details, please review complete Labs, Consult reports and Test reports for all details in brief Brittany Harvey, is a 68 y.o. female, patient with history of  HTN, mild dyslipidemia, was admitted to the hospital with chief complaints of burning substernal chest pain associated with some  shortness of breath. She ruled out for MI with serial negative troponins, EKG was nonacute, she was seen by cardiology and underwent NML cardiac MRI along with Lexi scan with results as below.  IMPRESSION:  1. Normal biventricular size and function. Mild concentric LVH. No wall motion abnormalities.  2. No rest or stress induced perfusion defect. No evidence of scar or ischemia by Lexiscan stress test.  3. No evidence of pericarditis.  4. Normal cardiac MRI, consider non-cardiac causes of chest pain.     Her pain and discomfort were likely due to reflux for which she'll be placed on PPI, outpatient followup with cardiology and GI is recommended post discharge. We'll request PCP to continue monitoring her risk factors for CAD and adjust medications as appropriate.    For her hypertension she will resume her home medications unchanged, she will be continued on aspirin, PPI added for reflux, outpatient monitoring of A1c and lipid panel.     Today   Subjective:   Brittany Harvey today has no headache,no chest abdominal pain,no new weakness tingling or numbness, feels much better wants to go home today.    Objective:   Blood pressure 125/65, pulse 57, temperature 98.2 F (36.8 C), temperature source Oral, resp. rate 18, height 5\' 1"  (1.549 m), SpO2 99.00%.  No intake or output data in the 24 hours ending 04/26/13 1225  Exam Awake Alert, Oriented *3, No new F.N deficits, Normal affect [place].AT,PERRAL Supple Neck,No JVD, No cervical lymphadenopathy appriciated.  Symmetrical Chest wall movement, Good air movement bilaterally, CTAB RRR,No Gallops,Rubs or new Murmurs, No Parasternal Heave +ve B.Sounds, Abd Soft, Non tender, No organomegaly appriciated, No  rebound -guarding or rigidity. No Cyanosis, Clubbing or edema, No new Rash or bruise  Data Review   CBC w Diff:  Lab Results  Component Value Date   WBC 8.8 04/26/2013   HGB 12.8 04/26/2013   HCT 36.7 04/26/2013   PLT 237 04/26/2013    LYMPHOPCT 25 03/18/2013   MONOPCT 4 03/18/2013   EOSPCT 1 03/18/2013   BASOPCT 0 03/18/2013    CMP:  Lab Results  Component Value Date   NA 144 04/25/2013   K 3.9 04/25/2013   CL 104 04/25/2013   CO2 23 04/25/2013   BUN 15 04/25/2013   CREATININE 0.67 04/26/2013   PROT 7.8 03/16/2013   ALBUMIN 3.8 03/16/2013   BILITOT 0.4 03/16/2013   ALKPHOS 64 03/16/2013   AST 18 03/16/2013   ALT 15 03/16/2013  .   Total Time in preparing paper work, data evaluation and todays exam - 35 minutes  Thurnell Lose M.D on 04/26/2013 at 12:25 PM  [facility] Office  [phone]

## 2013-04-27 DIAGNOSIS — R079 Chest pain, unspecified: Secondary | ICD-10-CM | POA: Diagnosis not present

## 2013-04-27 MED ORDER — IPRATROPIUM BROMIDE 0.02 % IN SOLN: 0.5000 mg | Freq: Four times a day (QID) | RESPIRATORY_TRACT | Status: DC | PRN

## 2013-04-27 NOTE — Progress Notes (Signed)
    SUBJECTIVE:  Feeling OK.  Wants to go home.   PHYSICAL EXAM Filed Vitals:   04/26/13 1747 04/26/13 1946 04/26/13 2000 04/27/13 0427  BP: 125/66  117/58 115/64  Pulse: 76 71 83 62  Temp: 98 F (36.7 C)  98 F (36.7 C) 98.4 F (36.9 C)  TempSrc: Oral  Oral Oral  Resp: 18 14 20 18   Height:      SpO2: 98% 98% 97% 98%   General:  No distress Lungs:  Clear Heart:  RRR, no rub Abdomen:  Positive bowel sounds, no rebound no guarding Extremities:  No edema   LABS: Lab Results  Component Value Date   TROPONINI <0.30 04/26/2013   Results for orders placed during the hospital encounter of 04/25/13 (from the past 24 hour(s))  TROPONIN I     Status: None   Collection Time    04/26/13  6:35 PM      Result Value Ref Range   Troponin I <0.30  <0.30 ng/mL    Intake/Output Summary (Last 24 hours) at 04/27/13 1010 Last data filed at 04/27/13 0900  Gross per 24 hour  Intake    120 ml  Output      0 ml  Net    120 ml    ASSESSMENT AND PLAN:  CHEST PAIN:  Negative cardiac MRI with stress.  No evidence of ischemia or pericarditis.  No further cardiac work up.  Discussed with the patient.   She wants to follow up with Dr. Orlean Patten PRN.   Jeneen Rinks Dimetri Armitage 04/27/2013 10:10 AM

## 2013-05-05 DIAGNOSIS — R079 Chest pain, unspecified: Secondary | ICD-10-CM | POA: Diagnosis not present

## 2013-05-05 DIAGNOSIS — F329 Major depressive disorder, single episode, unspecified: Secondary | ICD-10-CM | POA: Diagnosis not present

## 2013-05-05 DIAGNOSIS — Z09 Encounter for follow-up examination after completed treatment for conditions other than malignant neoplasm: Secondary | ICD-10-CM | POA: Diagnosis not present

## 2013-05-05 DIAGNOSIS — R4589 Other symptoms and signs involving emotional state: Secondary | ICD-10-CM | POA: Diagnosis not present

## 2013-05-09 DIAGNOSIS — Z1211 Encounter for screening for malignant neoplasm of colon: Secondary | ICD-10-CM | POA: Diagnosis not present

## 2013-05-09 DIAGNOSIS — K219 Gastro-esophageal reflux disease without esophagitis: Secondary | ICD-10-CM | POA: Diagnosis not present

## 2013-05-30 ENCOUNTER — Ambulatory Visit: Payer: Medicare Other | Admitting: Cardiovascular Disease

## 2013-07-05 ENCOUNTER — Ambulatory Visit (INDEPENDENT_AMBULATORY_CARE_PROVIDER_SITE_OTHER): Payer: Medicare Other | Admitting: Cardiovascular Disease

## 2013-07-05 ENCOUNTER — Encounter: Payer: Self-pay | Admitting: Cardiovascular Disease

## 2013-07-05 VITALS — BP 152/76 | HR 73 | Ht 61.0 in | Wt 140.0 lb

## 2013-07-05 DIAGNOSIS — I471 Supraventricular tachycardia: Secondary | ICD-10-CM | POA: Diagnosis not present

## 2013-07-05 DIAGNOSIS — R0789 Other chest pain: Secondary | ICD-10-CM

## 2013-07-05 DIAGNOSIS — I10 Essential (primary) hypertension: Secondary | ICD-10-CM | POA: Diagnosis not present

## 2013-07-05 NOTE — Assessment & Plan Note (Signed)
Patient was recently admitted to the hospital for chest pain rule out myocardial infarction. She underwent a stress MRI which was entirely normal suggesting her pain was noncardiac.

## 2013-07-05 NOTE — Assessment & Plan Note (Signed)
Controlled on current medications 

## 2013-07-05 NOTE — Patient Instructions (Signed)
Dr Berry has recommended that you follow-up with him on an as needed basis. 

## 2013-07-05 NOTE — Progress Notes (Signed)
07/05/2013 Brittany Harvey   Jun 06, 1945  [id number]  Primary Physician Salena Saner., MD Primary Cardiologist: Lorretta Harp MD [name]   HPI: Brittany Harvey is a 68 year old moderately overweight widowed Caucasian female who I last saw back in 2009. She saw Dr. Sallyanne Kuster back in 2011.[name] She has a history of PSVT remotely back in 2004. She had a false positive GXT back in 2011 which led to a Myoview which was normal. She was admitted to the hospital back in March after I saw her in the office for chest pain/rule out microinfarction. A stress MR I was completely normal. She said since and no recurrent symptoms. The presumed etiology of his of her chest pain was reflux.     Current Outpatient Prescriptions  Medication Sig Dispense Refill  . albuterol (PROVENTIL HFA;VENTOLIN HFA) 108 (90 BASE) MCG/ACT inhaler Inhale 2 puffs into the lungs every 4 (four) hours as needed for wheezing or shortness of breath.  1 Inhaler  0  . Ascorbic Acid (VITAMIN C PO) Take 1 tablet by mouth daily.      [name] aspirin 81 MG tablet Take 81 mg by mouth daily.      [name] CALCIUM CITRATE PO Take 1 tablet by mouth daily.       [name] ipratropium (ATROVENT HFA) 17 MCG/ACT inhaler Inhale 2 puffs into the lungs every 4 (four) hours as needed for wheezing.  1 Inhaler  0  . metoprolol succinate (TOPROL-XL) 25 MG 24 hr tablet Take 25 mg by mouth daily.      . Multiple Vitamins-Minerals (CENTRUM SILVER PO) Take 1 tablet by mouth daily.       . Omega-3 Fatty Acids (OMEGA 3 PO) Take by mouth daily.      [name] OVER THE COUNTER MEDICATION Take 1 tablet by mouth daily. "Mega Red"      . pantoprazole (PROTONIX) 40 MG tablet Take 1 tablet (40 mg total) by mouth daily. Switch for any other PPI at similar dose and frequency  30 tablet  3   No current facility-administered medications for this visit.    Allergies  Allergen Reactions  . Penicillins Anaphylaxis and Swelling  . Tussin Dm [Guaifenesin-Dm] Other (See Comments)   Dizziness/ Lightheadness  . Zithromax [Azithromycin]     presyncope    History   Social History  . Marital Status: Married    Spouse Name: [name]    Number of Children: [name]  . Years of Education: [name]   Occupational History  . Not on file.   Social History Main Topics  . Smoking status: Former Research scientist (life sciences)  . Smokeless tobacco: Not on file  . Alcohol Use: No  . Drug Use: No  . Sexual Activity: Not on file   Other Topics Concern  . Not on file   Social History Narrative  . No narrative on file     Review of Systems: General: negative for chills, fever, night sweats or weight changes.  Cardiovascular: negative for chest pain, dyspnea on exertion, edema, orthopnea, palpitations, paroxysmal nocturnal dyspnea or shortness of breath Dermatological: negative for rash Respiratory: negative for cough or wheezing Urologic: negative for hematuria Abdominal: negative for nausea, vomiting, diarrhea, bright red blood per rectum, melena, or hematemesis Neurologic: negative for visual changes, syncope, or dizziness All other systems reviewed and are otherwise negative except as noted above.    Blood pressure 152/76, pulse 73, height 5\' 1"  (1.549 m), weight 140 lb (63.504 kg).  General appearance: alert and no  distress Neck: no adenopathy, no carotid bruit, no JVD, supple, symmetrical, trachea midline and thyroid not enlarged, symmetric, no tenderness/mass/nodules Lungs: clear to auscultation bilaterally Heart: regular rate and rhythm, S1, S2 normal, no murmur, click, rub or gallop Extremities: extremities normal, atraumatic, no cyanosis or edema  EKG not performed today  ASSESSMENT AND PLAN:   Atypical chest pain Patient was recently admitted to the hospital for chest pain rule out myocardial infarction. She underwent a stress MRI which was entirely normal suggesting her pain was noncardiac.  Hypertension Controlled on current medications  PSVT (paroxysmal supraventricular  tachycardia) No recurrence      Lorretta Harp MD [facility] 07/05/2013 4:24 PM

## 2013-07-05 NOTE — Assessment & Plan Note (Signed)
No recurrence. 

## 2013-07-13 DIAGNOSIS — M9420 Chondromalacia, unspecified site: Secondary | ICD-10-CM | POA: Diagnosis not present

## 2013-07-13 DIAGNOSIS — M67919 Unspecified disorder of synovium and tendon, unspecified shoulder: Secondary | ICD-10-CM | POA: Diagnosis not present

## 2013-07-13 DIAGNOSIS — M546 Pain in thoracic spine: Secondary | ICD-10-CM | POA: Diagnosis not present

## 2013-07-13 DIAGNOSIS — M719 Bursopathy, unspecified: Secondary | ICD-10-CM | POA: Diagnosis not present

## 2013-07-13 DIAGNOSIS — M25529 Pain in unspecified elbow: Secondary | ICD-10-CM | POA: Diagnosis not present

## 2013-08-01 ENCOUNTER — Other Ambulatory Visit: Payer: Self-pay | Admitting: Orthopedic Surgery

## 2013-08-01 DIAGNOSIS — M546 Pain in thoracic spine: Secondary | ICD-10-CM

## 2013-08-12 ENCOUNTER — Ambulatory Visit
Admission: RE | Admit: 2013-08-12 | Discharge: 2013-08-12 | Disposition: A | Discharge status: Home / Self Care | Payer: Medicare Other | Source: Ambulatory Visit | Attending: Orthopedic Surgery | Admitting: Orthopedic Surgery

## 2013-08-12 ENCOUNTER — Other Ambulatory Visit: Payer: Medicare Other

## 2013-08-12 DIAGNOSIS — M5124 Other intervertebral disc displacement, thoracic region: Secondary | ICD-10-CM | POA: Diagnosis not present

## 2013-08-12 DIAGNOSIS — M47814 Spondylosis without myelopathy or radiculopathy, thoracic region: Secondary | ICD-10-CM | POA: Diagnosis not present

## 2013-08-12 DIAGNOSIS — M546 Pain in thoracic spine: Secondary | ICD-10-CM

## 2013-08-14 DIAGNOSIS — M25569 Pain in unspecified knee: Secondary | ICD-10-CM | POA: Diagnosis not present

## 2013-08-22 DIAGNOSIS — R059 Cough, unspecified: Secondary | ICD-10-CM | POA: Diagnosis not present

## 2013-08-22 DIAGNOSIS — R05 Cough: Secondary | ICD-10-CM | POA: Diagnosis not present

## 2013-08-22 DIAGNOSIS — Z79899 Other long term (current) drug therapy: Secondary | ICD-10-CM | POA: Diagnosis not present

## 2013-08-22 DIAGNOSIS — I10 Essential (primary) hypertension: Secondary | ICD-10-CM | POA: Diagnosis not present

## 2013-08-23 DIAGNOSIS — Z79899 Other long term (current) drug therapy: Secondary | ICD-10-CM | POA: Diagnosis not present

## 2013-08-23 DIAGNOSIS — R05 Cough: Secondary | ICD-10-CM | POA: Diagnosis not present

## 2013-08-23 DIAGNOSIS — I10 Essential (primary) hypertension: Secondary | ICD-10-CM | POA: Diagnosis not present

## 2013-08-23 DIAGNOSIS — R059 Cough, unspecified: Secondary | ICD-10-CM | POA: Diagnosis not present

## 2013-09-22 DIAGNOSIS — D126 Benign neoplasm of colon, unspecified: Secondary | ICD-10-CM | POA: Diagnosis not present

## 2013-09-22 DIAGNOSIS — K5730 Diverticulosis of large intestine without perforation or abscess without bleeding: Secondary | ICD-10-CM | POA: Diagnosis not present

## 2013-09-22 DIAGNOSIS — Z1211 Encounter for screening for malignant neoplasm of colon: Secondary | ICD-10-CM | POA: Diagnosis not present

## 2013-10-24 DIAGNOSIS — R05 Cough: Secondary | ICD-10-CM | POA: Diagnosis not present

## 2013-10-24 DIAGNOSIS — Z79899 Other long term (current) drug therapy: Secondary | ICD-10-CM | POA: Diagnosis not present

## 2013-10-24 DIAGNOSIS — R4589 Other symptoms and signs involving emotional state: Secondary | ICD-10-CM | POA: Diagnosis not present

## 2013-10-24 DIAGNOSIS — R059 Cough, unspecified: Secondary | ICD-10-CM | POA: Diagnosis not present

## 2013-10-24 DIAGNOSIS — K219 Gastro-esophageal reflux disease without esophagitis: Secondary | ICD-10-CM | POA: Diagnosis not present

## 2013-10-24 DIAGNOSIS — I10 Essential (primary) hypertension: Secondary | ICD-10-CM | POA: Diagnosis not present

## 2013-11-06 DIAGNOSIS — J4520 Mild intermittent asthma, uncomplicated: Secondary | ICD-10-CM | POA: Diagnosis not present

## 2013-11-06 DIAGNOSIS — J309 Allergic rhinitis, unspecified: Secondary | ICD-10-CM | POA: Diagnosis not present

## 2013-11-19 ENCOUNTER — Emergency Department (HOSPITAL_COMMUNITY)
Admission: EM | Admit: 2013-11-19 | Discharge: 2013-11-19 | Disposition: A | Discharge status: Home / Self Care | Payer: Medicare Other | Attending: Emergency Medicine | Admitting: Emergency Medicine

## 2013-11-19 ENCOUNTER — Encounter (HOSPITAL_COMMUNITY): Payer: Self-pay | Admitting: Emergency Medicine

## 2013-11-19 ENCOUNTER — Emergency Department (HOSPITAL_COMMUNITY)
Admission: EM | Admit: 2013-11-19 | Discharge: 2013-11-19 | Disposition: A | Discharge status: Home / Self Care | Payer: Medicare Other | Source: Home / Self Care | Attending: Emergency Medicine | Admitting: Emergency Medicine

## 2013-11-19 DIAGNOSIS — K219 Gastro-esophageal reflux disease without esophagitis: Secondary | ICD-10-CM | POA: Insufficient documentation

## 2013-11-19 DIAGNOSIS — Z79899 Other long term (current) drug therapy: Secondary | ICD-10-CM

## 2013-11-19 DIAGNOSIS — Z880 Allergy status to penicillin: Secondary | ICD-10-CM | POA: Insufficient documentation

## 2013-11-19 DIAGNOSIS — I10 Essential (primary) hypertension: Secondary | ICD-10-CM | POA: Insufficient documentation

## 2013-11-19 DIAGNOSIS — R000 Tachycardia, unspecified: Secondary | ICD-10-CM | POA: Diagnosis not present

## 2013-11-19 DIAGNOSIS — I471 Supraventricular tachycardia: Secondary | ICD-10-CM | POA: Insufficient documentation

## 2013-11-19 DIAGNOSIS — R0602 Shortness of breath: Secondary | ICD-10-CM | POA: Insufficient documentation

## 2013-11-19 DIAGNOSIS — Z7982 Long term (current) use of aspirin: Secondary | ICD-10-CM | POA: Insufficient documentation

## 2013-11-19 DIAGNOSIS — Z7952 Long term (current) use of systemic steroids: Secondary | ICD-10-CM | POA: Insufficient documentation

## 2013-11-19 DIAGNOSIS — Z8659 Personal history of other mental and behavioral disorders: Secondary | ICD-10-CM

## 2013-11-19 DIAGNOSIS — Z87891 Personal history of nicotine dependence: Secondary | ICD-10-CM | POA: Insufficient documentation

## 2013-11-19 DIAGNOSIS — Z87442 Personal history of urinary calculi: Secondary | ICD-10-CM | POA: Insufficient documentation

## 2013-11-19 DIAGNOSIS — I499 Cardiac arrhythmia, unspecified: Secondary | ICD-10-CM | POA: Diagnosis not present

## 2013-11-19 DIAGNOSIS — Z7951 Long term (current) use of inhaled steroids: Secondary | ICD-10-CM | POA: Insufficient documentation

## 2013-11-19 DIAGNOSIS — R0789 Other chest pain: Secondary | ICD-10-CM | POA: Diagnosis not present

## 2013-11-19 HISTORY — DX: Gastro-esophageal reflux disease without esophagitis: K21.9

## 2013-11-19 LAB — CBC
HCT: 38.4 % (ref 36.0–46.0)
Hemoglobin: 13.5 g/dL (ref 12.0–15.0)
MCH: 30.1 pg (ref 26.0–34.0)
MCHC: 35.2 g/dL (ref 30.0–36.0)
MCV: 85.7 fL (ref 78.0–100.0)
Platelets: 221 10*3/uL (ref 150–400)
RBC: 4.48 MIL/uL (ref 3.87–5.11)
RDW: 13.1 % (ref 11.5–15.5)
WBC: 6.8 10*3/uL (ref 4.0–10.5)

## 2013-11-19 LAB — I-STAT CHEM 8, ED
BUN: 9 mg/dL (ref 6–23)
Calcium, Ion: 1.14 mmol/L (ref 1.13–1.30)
Chloride: 111 mEq/L (ref 96–112)
Creatinine, Ser: 0.6 mg/dL (ref 0.50–1.10)
Glucose, Bld: 90 mg/dL (ref 70–99)
HCT: 41 % (ref 36.0–46.0)
Hemoglobin: 13.9 g/dL (ref 12.0–15.0)
Potassium: 3.8 mEq/L (ref 3.7–5.3)
Sodium: 146 mEq/L (ref 137–147)
TCO2: 23 mmol/L (ref 0–100)

## 2013-11-19 LAB — I-STAT TROPONIN, ED: Troponin i, poc: 0.02 ng/mL (ref 0.00–0.08)

## 2013-11-19 MED ORDER — SODIUM CHLORIDE 0.9 % IV SOLN
INTRAVENOUS | Status: DC
  Administered 2013-11-19: 15:00:00 via INTRAVENOUS

## 2013-11-19 MED ORDER — METOPROLOL TARTRATE 25 MG PO TABS
25.0000 mg | ORAL_TABLET | Freq: Once | ORAL | Status: AC
  Administered 2013-11-19: 25 mg via ORAL
  Filled 2013-11-19: qty 1

## 2013-11-19 MED ORDER — ADENOSINE 6 MG/2ML IV SOLN
6.0000 mg | Freq: Once | INTRAVENOUS | Status: AC
  Administered 2013-11-19: 6 mg via INTRAVENOUS
  Filled 2013-11-19: qty 2

## 2013-11-19 MED ORDER — METOPROLOL TARTRATE 25 MG PO TABS: 25.0000 mg | ORAL_TABLET | Freq: Once | ORAL | Status: DC | PRN

## 2013-11-19 MED ORDER — METOPROLOL SUCCINATE ER 25 MG PO TB24: 50.0000 mg | ORAL_TABLET | Freq: Every day | ORAL | Status: DC

## 2013-11-19 MED ORDER — SODIUM CHLORIDE 0.9 % IV BOLUS (SEPSIS)
500.0000 mL | Freq: Once | INTRAVENOUS | Status: AC
  Administered 2013-11-19: 500 mL via INTRAVENOUS

## 2013-11-19 MED ORDER — METOPROLOL TARTRATE 1 MG/ML IV SOLN
2.5000 mg | Freq: Once | INTRAVENOUS | Status: DC
  Filled 2013-11-19: qty 5

## 2013-11-19 NOTE — ED Notes (Signed)
States she was just discharged fro Ed was walking to her car and her heart started racing. . C/o slight chest discomfort

## 2013-11-19 NOTE — ED Provider Notes (Signed)
1600 - Care from Dr. Alvino Chapel. 74F here with SVT. Intermittent today. Given extra meds, no further SVT at this point. After observation, no SVT. No SVT with ambulation. Stable for discharge. Has Rx for increased metoprolol.  1. PSVT (paroxysmal supraventricular tachycardia)      Evelina Bucy, MD 11/19/13 1859

## 2013-11-19 NOTE — ED Notes (Signed)
Woke up this morning and felt palpitations; checked her own pulse and it was 127. SOB also on waking, did atrovent treatment and it resolved. Chest pain yesterday, none today.

## 2013-11-19 NOTE — Discharge Instructions (Signed)
Supraventricular Tachycardia °Supraventricular tachycardia (SVT) is an abnormal heart rhythm (arrhythmia) that causes the heart to beat very fast (tachycardia). This kind of fast heartbeat originates in the upper chambers of the heart (atria). SVT can cause the heart to beat greater than 100 beats per minute. SVT can have a rapid burst of heartbeats. This can start and stop suddenly without warning and is called nonsustained. SVT can also be sustained, in which the heart beats at a continuous fast rate.  °CAUSES  °There can be different causes of SVT. Some of these include: °· Heart valve problems such as mitral valve prolapse. °· An enlarged heart (hypertrophic cardiomyopathy). °· Congenital heart problems. °· Heart inflammation (pericarditis). °· Hyperthyroidism. °· Low potassium or magnesium levels. °· Caffeine. °· Drug use such as cocaine, methamphetamines, or stimulants. °· Some over-the-counter medicines such as: °¨ Decongestants. °¨ Diet medicines. °¨ Herbal medicines. °SYMPTOMS  °Symptoms of SVT can vary. Symptoms depend on whether the SVT is sustained or nonsustained. You may experience: °· No symptoms (asymptomatic). °· An awareness of your heart beating rapidly (palpitations). °· Shortness of breath. °· Chest pain or pressure. °If your blood pressure drops because of the SVT, you may experience: °· Fainting or near fainting. °· Weakness. °· Dizziness. °DIAGNOSIS  °Different tests can be performed to diagnose SVT, such as: °· An electrocardiogram (EKG). This is a painless test that records the electrical activity of your heart. °· Holter monitor. This is a 24 hour recording of your heart rhythm. You will be given a diary. Write down all symptoms that you have and what you were doing at the time you experienced symptoms. °· Arrhythmia monitor. This is a small device that your wear for several weeks. It records the heart rhythm when you have symptoms. °· Echocardiogram. This is an imaging test to help detect  abnormal heart structure such as congenital abnormalities, heart valve problems, or heart enlargement. °· Stress test. This test can help determine if the SVT is related to exercise. °· Electrophysiology study (EPS). This is a procedure that evaluates your heart's electrical system and can help your caregiver find the cause of your SVT. °TREATMENT  °Treatment of SVT depends on the symptoms, how often it recurs, and whether there are any underlying heart problems.  °· If symptoms are rare and no other cardiac disease is present, no treatment may be needed. °· Blood work may be done to check potassium, magnesium, and thyroid hormone levels to see if they are abnormal. If these levels are abnormal, treatment to correct the problems will occur. °Medicines °Your caregiver may use oral medicines to treat SVT. These medicines are given for long-term control of SVT. Medicines may be used alone or in combination with other treatments. These medicines work to slow nerve impulses in the heart muscle. These medicines can also be used to treat high blood pressure. Some of these medicines may include: °· Calcium channel blockers. °· Beta blockers. °· Digoxin. °Nonsurgical procedures °Nonsurgical techniques may be used if oral medicines do not work. Some examples include: °· Cardioversion. This technique uses either drugs or an electrical shock to restore a normal heart rhythm. °¨ Cardioversion drugs may be given through an intravenous (IV) line to help "reset" the heart rhythm. °¨ In electrical cardioversion, the caregiver shocks your heart to stop its beat for a split second. This helps to reset the heart to a normal rhythm. °· Ablation. This procedure is done under mild sedation. High frequency radio wave energy is used to   destroy the area of heart tissue responsible for the SVT. °HOME CARE INSTRUCTIONS  °· Do not smoke. °· Only take medicines prescribed by your caregiver. Check with your caregiver before using over-the-counter  medicines. °· Check with your caregiver about how much alcohol and caffeine (coffee, tea, colas, or chocolate) you may have. °· It is very important to keep all follow-up referrals and appointments in order to properly manage this problem. °SEEK IMMEDIATE MEDICAL CARE IF: °· You have dizziness. °· You faint or nearly faint. °· You have shortness of breath. °· You have chest pain or pressure. °· You have sudden nausea or vomiting. °· You have profuse sweating. °· You are concerned about how long your symptoms last. °· You are concerned about the frequency of your SVT episodes. °If you have the above symptoms, call your local emergency services (911 in [place] immediately. Do not drive yourself to the hospital. °MAKE SURE YOU:  °· Understand these instructions. °· Will watch your condition. °· Will get help right away if you are not doing well or get worse. °Document Released: 01/12/2005 Document Revised: 04/06/2011 Document Reviewed: 04/26/2008 °Exit[employer]® Patient Information ©2015 [employer]. This information is not intended to replace advice given to you by your health care provider. Make sure you discuss any questions you have with your health care provider. ° °

## 2013-11-19 NOTE — ED Notes (Signed)
Pt here for tachycardia and palpitations; pt seen here today for same and now having again

## 2013-11-19 NOTE — ED Provider Notes (Signed)
CSN: [id number]     Arrival date & time 11/19/13  1019 History   First MD Initiated Contact with Patient 11/19/13 1018     Chief Complaint  Patient presents with  . Tachycardia     (Consider location/radiation/quality/duration/timing/severity/associated sxs/prior Treatment) The history is provided by the patient.   patient woke today feeling her heart go faster. She has a history of PSVT. Mild shortness of breath. Slight chest tightness. She had slight shortness of breath that was resolved with Atrovent. No swelling in her legs no fevers. No coughing.  Past Medical History  Diagnosis Date  . Heart palpitations   . Anxiety   . Hypertension   . PSVT (paroxysmal supraventricular tachycardia)   . Atypical chest pain   . Kidney stones   . GERD (gastroesophageal reflux disease)    Past Surgical History  Procedure Laterality Date  . Tubal ligation    . Cardiovascular stress test  06/19/2009    Post stress myocardial perfusion images show a normal pattern of perfusion in all regions. ECG positive for ischemia. Appears to be a "false-positive" ECG stress test.   History reviewed. No pertinent family history. History  Substance Use Topics  . Smoking status: Former Research scientist (life sciences)  . Smokeless tobacco: Not on file  . Alcohol Use: No   OB History   Grav Para Term Preterm Abortions TAB SAB Ect Mult Living                 Review of Systems  Constitutional: Negative for activity change and appetite change.  Eyes: Negative for pain.  Respiratory: Positive for shortness of breath. Negative for chest tightness.   Cardiovascular: Positive for palpitations. Negative for chest pain and leg swelling.  Gastrointestinal: Negative for nausea, vomiting, abdominal pain and diarrhea.  Genitourinary: Negative for flank pain.  Musculoskeletal: Negative for back pain and neck stiffness.  Skin: Negative for rash.  Neurological: Negative for weakness, numbness and headaches.  Psychiatric/Behavioral:  Negative for behavioral problems.      Allergies  Penicillins; Tussin dm; and Zithromax  Home Medications   Prior to Admission medications   Medication Sig Start Date End Date Taking? Authorizing Provider  albuterol (PROVENTIL HFA;VENTOLIN HFA) 108 (90 BASE) MCG/ACT inhaler Inhale 2 puffs into the lungs every 4 (four) hours as needed for wheezing or shortness of breath. 03/18/13  Yes Merryl Hacker, MD  aspirin 81 MG tablet Take 81 mg by mouth daily.   Yes Historical Provider, MD  CALCIUM CITRATE PO Take 1 tablet by mouth daily.    Yes Historical Provider, MD  Hydrocortisone (AQTMAUQJF-35 EX) Apply 1 application topically daily as needed [facility]).   Yes Historical Provider, MD  ipratropium (ATROVENT HFA) 17 MCG/ACT inhaler Inhale 2 puffs into the lungs every 4 (four) hours as needed for wheezing. 03/18/13  Yes Merryl Hacker, MD  metoprolol succinate (TOPROL-XL) 25 MG 24 hr tablet Take 25 mg by mouth daily.   Yes Historical Provider, MD  Multiple Vitamins-Minerals (CENTRUM SILVER PO) Take 1 tablet by mouth daily.    Yes Historical Provider, MD  OVER THE COUNTER MEDICATION Take 1 tablet by mouth daily. "Mega Red"   Yes Historical Provider, MD  pantoprazole (PROTONIX) 40 MG tablet Take 1 tablet (40 mg total) by mouth daily. Switch for any other PPI at similar dose and frequency 04/26/13  Yes Thurnell Lose, MD  metoprolol (LOPRESSOR) 25 MG tablet Take 1 tablet (25 mg total) by mouth once as needed (tachycardia). 11/19/13  Jasper Riling. Amyjo Mizrachi, MD   BP 119/59  Pulse 81  Temp(Src) 97.9 F (36.6 C) (Oral)  Resp 23  Ht 5\' 1"  (1.549 m)  Wt 132 lb (59.875 kg)  BMI 24.95 kg/m2  SpO2 99% Physical Exam  Nursing note and vitals reviewed. Constitutional: She is oriented to person, place, and time. She appears well-developed and well-nourished.  HENT:  Head: Normocephalic and atraumatic.  Neck: Normal range of motion. Neck supple.  Cardiovascular: Regular rhythm and normal heart  sounds.   Tachycardia  Pulmonary/Chest: Effort normal and breath sounds normal. No respiratory distress. She has no wheezes.  Abdominal: Soft. There is no tenderness.  Musculoskeletal: Normal range of motion.  Neurological: She is alert and oriented to person, place, and time. No cranial nerve deficit.  Skin: Skin is warm and dry.  Psychiatric: She has a normal mood and affect. Her speech is normal.    ED Course  Procedures (including critical care time) Labs Review Labs Reviewed  CBC  I-STAT CHEM 8, ED  I-STAT TROPOININ, ED    Imaging Review No results found.   EKG Interpretation None      Date: 11/19/2013 1028  Rate: 116  Rhythm: supraventricular tachycardia (SVT)  QRS Axis: normal  Intervals: normal  ST/T Wave abnormalities: nonspecific ST/T changes  Conduction Disutrbances:none  Narrative Interpretation: SVT with likely rate relate ST changes.   Old EKG Reviewed: changes noted   Date: 11/19/2013  Rate: 91  Rhythm: normal sinus rhythm  QRS Axis: normal  Intervals: normal  ST/T Wave abnormalities: nonspecific ST/T changes  Conduction Disutrbances:none  Narrative Interpretation: Now back in sinus rhythm. ST changes improved  Old EKG Reviewed: changes noted   MDM   Final diagnoses:  PSVT (paroxysmal supraventricular tachycardia)    Patient with SVT and history of same, converted with adenosine. Strain pattern versus old. No chest pain. Patient has remained in sinus rhythm. Discuss with patient and will give 5 pills when necessary metoprolol. She has episodes of these, but they always resolved without intervention by her. Discussed increasing her baseline metoprolol and she is hesitant to do this. Will have follow with cardiology.    Jasper Riling. Alvino Chapel, MD 11/19/13 [phone]

## 2013-11-19 NOTE — ED Notes (Signed)
Pt ambulated around ED with cardiac monitor in place.  Heart rate remained stable at a rate of 83.  No complaints voiced by pt.

## 2013-11-19 NOTE — ED Provider Notes (Signed)
CSN: [id number]     Arrival date & time 11/19/13  1412 History   First MD Initiated Contact with Patient 11/19/13 1424     Chief Complaint  Patient presents with  . Palpitations     (Consider location/radiation/quality/duration/timing/severity/associated sxs/prior Treatment) Patient is a 68 y.o. female presenting with palpitations.  Palpitations Associated symptoms: shortness of breath   Associated symptoms: no back pain, no chest pain, no nausea, no numbness and no vomiting    patient presents with tachycardia. She was seen by myself earlier today for paroxysmal SVT that was converted with adenosine. After discussion with the patient the plan was at that time to give metoprolol 25 mg when necessary pill in the pocket for episodes. While on the way home she went back into her SVT. Since she has continued feeling of her heart going fast. Upon arrival to the ER she was back in SVT. No fevers. No cough mild shortness of breath. Lab work earlier today reassuring.    Past Medical History  Diagnosis Date  . Heart palpitations   . Anxiety   . Hypertension   . PSVT (paroxysmal supraventricular tachycardia)   . Atypical chest pain   . Kidney stones   . GERD (gastroesophageal reflux disease)    Past Surgical History  Procedure Laterality Date  . Tubal ligation    . Cardiovascular stress test  06/19/2009    Post stress myocardial perfusion images show a normal pattern of perfusion in all regions. ECG positive for ischemia. Appears to be a "false-positive" ECG stress test.   History reviewed. No pertinent family history. History  Substance Use Topics  . Smoking status: Former Research scientist (life sciences)  . Smokeless tobacco: Not on file  . Alcohol Use: No   OB History   Grav Para Term Preterm Abortions TAB SAB Ect Mult Living                 Review of Systems  Constitutional: Negative for activity change and appetite change.  Eyes: Negative for pain.  Respiratory: Positive for shortness of breath.  Negative for chest tightness.   Cardiovascular: Positive for palpitations. Negative for chest pain and leg swelling.  Gastrointestinal: Negative for nausea, vomiting, abdominal pain and diarrhea.  Genitourinary: Negative for flank pain.  Musculoskeletal: Negative for back pain and neck stiffness.  Skin: Negative for rash.  Neurological: Negative for weakness and numbness.  Psychiatric/Behavioral: Negative for behavioral problems.      Allergies  Penicillins; Tussin dm; and Zithromax  Home Medications   Prior to Admission medications   Medication Sig Start Date End Date Taking? Authorizing Provider  albuterol (PROVENTIL HFA;VENTOLIN HFA) 108 (90 BASE) MCG/ACT inhaler Inhale 2 puffs into the lungs every 4 (four) hours as needed for wheezing or shortness of breath. 03/18/13  Yes Merryl Hacker, MD  aspirin 81 MG tablet Take 81 mg by mouth daily.   Yes Historical Provider, MD  CALCIUM CITRATE PO Take 1 tablet by mouth daily.    Yes Historical Provider, MD  Hydrocortisone (CVELFYBOF-75 EX) Apply 1 application topically daily as needed [facility]).   Yes Historical Provider, MD  ipratropium (ATROVENT HFA) 17 MCG/ACT inhaler Inhale 2 puffs into the lungs every 4 (four) hours as needed for wheezing. 03/18/13  Yes Merryl Hacker, MD  Multiple Vitamins-Minerals (CENTRUM SILVER PO) Take 1 tablet by mouth daily.    Yes Historical Provider, MD  OVER THE COUNTER MEDICATION Take 1 tablet by mouth daily. "Mega Red"   Yes Historical Provider, MD  pantoprazole (PROTONIX) 40 MG tablet Take 1 tablet (40 mg total) by mouth daily. Switch for any other PPI at similar dose and frequency 04/26/13  Yes Thurnell Lose, MD  metoprolol (LOPRESSOR) 25 MG tablet Take 1 tablet (25 mg total) by mouth once as needed (tachycardia). 11/19/13   Jasper Riling. Jaeceon Michelin, MD  metoprolol succinate (TOPROL-XL) 25 MG 24 hr tablet Take 2 tablets (50 mg total) by mouth daily. 11/19/13   Jasper Riling. Kamir Selover, MD   BP 113/66   Pulse 74  Temp(Src) 97.5 F (36.4 C) (Oral)  Resp 23  SpO2 98% Physical Exam  Nursing note and vitals reviewed. Constitutional: She is oriented to person, place, and time. She appears well-developed and well-nourished.  HENT:  Head: Normocephalic and atraumatic.  Eyes: EOM are normal. Pupils are equal, round, and reactive to light.  Neck: Normal range of motion. Neck supple.  Cardiovascular: Regular rhythm and normal heart sounds.   No murmur heard. Tachycardia  Pulmonary/Chest: Effort normal and breath sounds normal. No respiratory distress. She has no wheezes. She has no rales.  Abdominal: Soft. Bowel sounds are normal. She exhibits no distension.  Musculoskeletal: Normal range of motion.  Neurological: She is alert and oriented to person, place, and time. No cranial nerve deficit.  Skin: Skin is warm and dry.  Psychiatric: She has a normal mood and affect. Her speech is normal.    ED Course  Procedures (including critical care time) Labs Review Labs Reviewed - No data to display  Imaging Review No results found.   EKG Interpretation   Date/Time:  Sunday November 19 2013 14:19:28 EDT Ventricular Rate:  134 PR Interval:    QRS Duration: 134 QT Interval:  294 QTC Calculation: 439 R Axis:   59 Text Interpretation:  SVT Non-specific intra-ventricular conduction block  Abnormal ECG Confirmed by Alvino Chapel  MD, Ovid Curd [phone]) on 11/19/2013  4:26:46 PM      MDM   Final diagnoses:  PSVT (paroxysmal supraventricular tachycardia)    Patient with SVT. Seen earlier today for the same. Discuss with Dr. Rayann Heman from cardiology. Will give 25 mg of oral metoprolol. Patient spontaneously converted to sinus rhythm while in the ER before getting the metoprolol. Will monitor for approximately 4 hours and if she remains in sinus rhythm will discharge home with an increase of her chronic metoprolol.    Jasper Riling. Alvino Chapel, MD 11/19/13 [phone]

## 2013-11-19 NOTE — ED Notes (Signed)
Pt ambulated without difficulty

## 2013-11-21 ENCOUNTER — Telehealth: Payer: Self-pay | Admitting: Cardiovascular Disease

## 2013-11-21 NOTE — Telephone Encounter (Signed)
Brittany Harvey is calling because she had to go to the [facility] on Sunday and they increased her Toprol and told her to take it twice a day and she has been taking it once a day . Now her bp 98/58 and her pulse is 58 and wants to know should she continue to take the medication twice a day if it will continue to make her blood pressure drop as like this . Please call   Thanks

## 2013-11-21 NOTE — Telephone Encounter (Signed)
Spoke with pt, taking the metoprolol 25 mg twice daily, she had dizziness and felt strange this morning. her bp quickly returned to 105. She will try taking the 25 mg at bedtime and take the 12.5 mg in the morning to see if that will help keep her bp up during the day. She has a follow up appt on Monday and will call prior to that appointment if she cont to have problems.

## 2013-11-21 NOTE — Telephone Encounter (Signed)
Left message for pt to call.

## 2013-11-27 ENCOUNTER — Ambulatory Visit (INDEPENDENT_AMBULATORY_CARE_PROVIDER_SITE_OTHER): Payer: Medicare Other | Admitting: Cardiology

## 2013-11-27 ENCOUNTER — Encounter: Payer: Self-pay | Admitting: Cardiology

## 2013-11-27 VITALS — BP 132/60 | HR 63 | Ht 61.25 in | Wt 134.3 lb

## 2013-11-27 DIAGNOSIS — I471 Supraventricular tachycardia: Secondary | ICD-10-CM | POA: Diagnosis not present

## 2013-11-27 DIAGNOSIS — K219 Gastro-esophageal reflux disease without esophagitis: Secondary | ICD-10-CM | POA: Diagnosis not present

## 2013-11-27 LAB — TSH: TSH: 1.418 u[IU]/mL (ref 0.350–4.500)

## 2013-11-27 NOTE — Progress Notes (Signed)
11/27/2013 Brittany Harvey   [birth year]  [id number]  Primary Physicia PROVIDER NOT IN SYSTEM Primary Cardiologist: Dr Gwenlyn Found  HPI:  Pleasant 68 y/o female, works at [employer], followed by our group with a history of PSVT and chest pain. She had documented PSVT in 2011 per records. She has had a low risk Myoview in 2011. We saw her in the hospital for chest pain in March of this year. A Stress MRI was negative. She was placed on a PPI but says she developed "tingling" in her fingers and stopped this. She presented to the ER via EMS on 11/19/13 with tachycardia. She admits she has had these episodes off and on but they are usually short lived and resolve spontaneously. She received Adenosine in the ER and broke to NSR and was sent home. On the way home her SVT recurred and she returned to the ER, this time her Toprol 25 mg daily was changed to Metoprolol 25 mg BID. Since discharge she has decreased her Metoprolol secondary to low B/P readings at home "98/ 50". She has had some vague dizziness and weakness with this. She has not had recurrent SVT but is very concerned it will recur.    Current Outpatient Prescriptions  Medication Sig Dispense Refill  . albuterol (PROVENTIL HFA;VENTOLIN HFA) 108 (90 BASE) MCG/ACT inhaler Inhale 2 puffs into the lungs every 4 (four) hours as needed for wheezing or shortness of breath. 1 Inhaler 0  . aspirin 81 MG tablet Take 81 mg by mouth daily.    [name] CALCIUM CITRATE PO Take 1 tablet by mouth daily.     . fluticasone (FLOVENT HFA) 110 MCG/ACT inhaler Inhale 1 puff into the lungs 2 (two) times daily as needed.    . Hydrocortisone ([id number]-27 EX) Apply 1 application topically daily as needed [facility]).    [name] ipratropium (ATROVENT HFA) 17 MCG/ACT inhaler Inhale 2 puffs into the lungs every 4 (four) hours as needed for wheezing. 1 Inhaler 0  . metoprolol (LOPRESSOR) 25 MG tablet Take 1 tablet (25 mg total) by mouth once as needed (tachycardia). 5 tablet 0  .  metoprolol succinate (TOPROL-XL) 25 MG 24 hr tablet Take 2 tablets (50 mg total) by mouth daily. (Patient taking differently: Take 25 mg by mouth 2 (two) times daily. Take 1/2 tablet in the AM and 1 tablet in the PM.) 60 tablet 0  . Multiple Vitamins-Minerals (CENTRUM SILVER PO) Take 1 tablet by mouth daily.     [name] OVER THE COUNTER MEDICATION Take 1 tablet by mouth daily. "Mega Red"    . pantoprazole (PROTONIX) 40 MG tablet Take 1 tablet (40 mg total) by mouth daily. Switch for any other PPI at similar dose and frequency 30 tablet 3  . triamcinolone cream (KENALOG) 0.1 % as needed.  3   No current facility-administered medications for this visit.    Allergies  Allergen Reactions  . Penicillins Anaphylaxis and Swelling  . Tussin Dm [Guaifenesin-Dm] Other (See Comments)    Dizziness/ Lightheadness  . Zithromax [Azithromycin]     presyncope    History   Social History  . Marital Status: Married    Spouse Name: [name]    Number of Children: [name]  . Years of Education: [name]   Occupational History  . Not on file.   Social History Main Topics  . Smoking status: Former Research scientist (life sciences)  . Smokeless tobacco: Not on file  . Alcohol Use: No  . Drug Use: No  . Sexual  Activity: Not on file   Other Topics Concern  . Not on file   Social History Narrative     Review of Systems: General: negative for chills, fever, night sweats or weight changes.  Cardiovascular: negative for chest pain, dyspnea on exertion, edema, orthopnea, palpitations, paroxysmal nocturnal dyspnea or shortness of breath Dermatological: negative for rash Respiratory: negative for cough or wheezing Urologic: negative for hematuria Abdominal: negative for nausea, vomiting, diarrhea, bright red blood per rectum, melena, or hematemesis Neurologic: negative for visual changes, syncope, or dizziness All other systems reviewed and are otherwise negative except as noted above.    Blood pressure 132/60, pulse 63, height 5' 1.25"  (1.556 m), weight 134 lb 4.8 oz (60.918 kg).  General appearance: alert, cooperative and no distress Neck: no carotid bruit and no JVD Lungs: clear to auscultation bilaterally Heart: regular rate and rhythm Extremities: no edema  EKG NSR  ASSESSMENT AND PLAN:   PSVT (paroxysmal supraventricular tachycardia) Recurrent PSVT- seen in the ER 11/19/13. She continues to have brief episodes of tachycardia  GERD (gastroesophageal reflux disease) She has been sleeping in a recliner for 3 months. She stopped Protonix secondary to side effects.    PLAN  I have referred her to EP for consideration of RFA. I'll document a TSH today.   Alma Mohiuddin KPA-C 11/27/2013 8:56 AM

## 2013-11-27 NOTE — Patient Instructions (Signed)
You have been referred to Dr. Thompson Grayer to address and treat your SVT.  Your physician recommends that you return for lab work in: today at [facility].  Your physician recommends that you schedule a follow-up appointment in: 6 months with Dr. Gwenlyn Found.

## 2013-11-27 NOTE — Assessment & Plan Note (Signed)
She has been sleeping in a recliner for 3 months. She stopped Protonix secondary to side effects.

## 2013-11-27 NOTE — Assessment & Plan Note (Signed)
Recurrent PSVT- seen in the ER 11/19/13. She continues to have brief episodes of tachycardia

## 2013-11-30 ENCOUNTER — Telehealth: Payer: Self-pay | Admitting: Cardiovascular Disease

## 2013-11-30 NOTE — Telephone Encounter (Signed)
Is wanting the lab results .[name] Please call

## 2013-11-30 NOTE — Telephone Encounter (Signed)
Informed patient of normal TSH result

## 2013-12-04 ENCOUNTER — Encounter: Payer: Self-pay | Admitting: Cardiovascular Disease

## 2013-12-12 DIAGNOSIS — K219 Gastro-esophageal reflux disease without esophagitis: Secondary | ICD-10-CM | POA: Diagnosis not present

## 2013-12-12 DIAGNOSIS — R1013 Epigastric pain: Secondary | ICD-10-CM | POA: Diagnosis not present

## 2013-12-12 DIAGNOSIS — R634 Abnormal weight loss: Secondary | ICD-10-CM | POA: Diagnosis not present

## 2013-12-29 ENCOUNTER — Other Ambulatory Visit: Payer: Self-pay

## 2013-12-29 ENCOUNTER — Encounter: Payer: Self-pay | Admitting: Internal Medicine

## 2013-12-29 ENCOUNTER — Ambulatory Visit (INDEPENDENT_AMBULATORY_CARE_PROVIDER_SITE_OTHER): Payer: Medicare Other | Admitting: Internal Medicine

## 2013-12-29 VITALS — BP 116/62 | HR 63 | Ht 61.0 in | Wt 135.2 lb

## 2013-12-29 DIAGNOSIS — I471 Supraventricular tachycardia: Secondary | ICD-10-CM | POA: Diagnosis not present

## 2013-12-29 MED ORDER — METOPROLOL SUCCINATE ER 25 MG PO TB24: 37.5000 mg | ORAL_TABLET | Freq: Every day | ORAL | Status: DC

## 2013-12-29 NOTE — Telephone Encounter (Signed)
Rx was sent to pharmacy electronically. 

## 2013-12-29 NOTE — Patient Instructions (Signed)
Your physician has recommended that you have an ablation. Catheter ablation is a medical procedure used to treat some cardiac arrhythmias (irregular heartbeats). During catheter ablation, a long, thin, flexible tube is put into a blood vessel in your groin (upper thigh), or neck. This tube is called an ablation catheter. It is then guided to your heart through the blood vessel. Radio frequency waves destroy small areas of heart tissue where abnormal heartbeats may cause an arrhythmia to start. Please see the instruction sheet given to you today.  Your physician has requested that you have an echocardiogram. Echocardiography is a painless test that uses sound waves to create images of your heart. It provides your doctor with information about the size and shape of your heart and how well your heart's chambers and valves are working. This procedure takes approximately one hour. There are no restrictions for this procedure.  Available days for procedure: 12/8, 12/10, 21/15, 12/17, 12/22,1/5, 1/7, 1/19, 1/21, 1/26, 1/29--CAll Kelly to schedule  Supraventricular Tachycardia Supraventricular tachycardia (SVT) is an abnormal heart rhythm (arrhythmia) that causes the heart to beat very fast (tachycardia). This kind of fast heartbeat originates in the upper chambers of the heart (atria). SVT can cause the heart to beat greater than 100 beats per minute. SVT can have a rapid burst of heartbeats. This can start and stop suddenly without warning and is called nonsustained. SVT can also be sustained, in which the heart beats at a continuous fast rate.  CAUSES  There can be different causes of SVT. Some of these include:  Heart valve problems such as mitral valve prolapse.  An enlarged heart (hypertrophic cardiomyopathy).  Congenital heart problems.  Heart inflammation (pericarditis).  Hyperthyroidism.  Low potassium or magnesium levels.  Caffeine.  Drug use such as cocaine, methamphetamines, or  stimulants.  Some over-the-counter medicines such as:  Decongestants.  Diet medicines.  Herbal medicines. SYMPTOMS  Symptoms of SVT can vary. Symptoms depend on whether the SVT is sustained or nonsustained. You may experience:  No symptoms (asymptomatic).  An awareness of your heart beating rapidly (palpitations).  Shortness of breath.  Chest pain or pressure. If your blood pressure drops because of the SVT, you may experience:  Fainting or near fainting.  Weakness.  Dizziness. DIAGNOSIS  Different tests can be performed to diagnose SVT, such as:  An electrocardiogram (EKG). This is a painless test that records the electrical activity of your heart.  Holter monitor. This is a 24 hour recording of your heart rhythm. You will be given a diary. Write down all symptoms that you have and what you were doing at the time you experienced symptoms.  Arrhythmia monitor. This is a small device that your wear for several weeks. It records the heart rhythm when you have symptoms.  Echocardiogram. This is an imaging test to help detect abnormal heart structure such as congenital abnormalities, heart valve problems, or heart enlargement.  Stress test. This test can help determine if the SVT is related to exercise.  Electrophysiology study (EPS). This is a procedure that evaluates your heart's electrical system and can help your caregiver find the cause of your SVT. TREATMENT  Treatment of SVT depends on the symptoms, how often it recurs, and whether there are any underlying heart problems.   If symptoms are rare and no other cardiac disease is present, no treatment may be needed.  Blood work may be done to check potassium, magnesium, and thyroid hormone levels to see if they are abnormal. If these levels  are abnormal, treatment to correct the problems will occur. Medicines Your caregiver may use oral medicines to treat SVT. These medicines are given for long-term control of SVT.  Medicines may be used alone or in combination with other treatments. These medicines work to slow nerve impulses in the heart muscle. These medicines can also be used to treat high blood pressure. Some of these medicines may include:  Calcium channel blockers.  Beta blockers.  Digoxin. Nonsurgical procedures Nonsurgical techniques may be used if oral medicines do not work. Some examples include:  Cardioversion. This technique uses either drugs or an electrical shock to restore a normal heart rhythm.  Cardioversion drugs may be given through an intravenous (IV) line to help "reset" the heart rhythm.  In electrical cardioversion, the caregiver shocks your heart to stop its beat for a split second. This helps to reset the heart to a normal rhythm.  Ablation. This procedure is done under mild sedation. High frequency radio wave energy is used to destroy the area of heart tissue responsible for the SVT. HOME CARE INSTRUCTIONS   Do not smoke.  Only take medicines prescribed by your caregiver. Check with your caregiver before using over-the-counter medicines.  Check with your caregiver about how much alcohol and caffeine (coffee, tea, colas, or chocolate) you may have.  It is very important to keep all follow-up referrals and appointments in order to properly manage this problem. SEEK IMMEDIATE MEDICAL CARE IF:  You have dizziness.  You faint or nearly faint.  You have shortness of breath.  You have chest pain or pressure.  You have sudden nausea or vomiting.  You have profuse sweating.  You are concerned about how long your symptoms last.  You are concerned about the frequency of your SVT episodes. If you have the above symptoms, call your local emergency services (911 in [place] immediately. Do not drive yourself to the hospital. MAKE SURE YOU:   Understand these instructions.  Will watch your condition.  Will get help right away if you are not doing well or get  worse. Document Released: 01/12/2005 Document Revised: 04/06/2011 Document Reviewed: 04/26/2008 [facility] Patient Information 2015 [place], [place]. This information is not intended to replace advice given to you by your health care provider. Make sure you discuss any questions you have with your health care provider.

## 2014-01-01 ENCOUNTER — Encounter: Payer: Self-pay | Admitting: Internal Medicine

## 2014-01-01 NOTE — Progress Notes (Signed)
Referring Physician:  Dr Mindi Curling Brittany Harvey is a 68 y.o. female with a h/o recurrent SVT who presents today for EP consultation.  She reports having abrupt onset/offset tachypalpitations for several years.  Episodes have increased in frequency and duration.    She had previously documented PSVT in 2011 per records. She has had a low risk Myoview in 2011.  Episodes are typically short and self terminating  Most recently, she presented to the ER via EMS on 11/19/13 with documented SVT after her symptoms did not spontaneously terminate.  She received Adenosine in the ER and broke to NSR and was sent home. On the way home her SVT recurred and she returned to the ER.  She wsa given metoprolol and discharged.  She has not tolerated this very well and continues o have short tachypalpitations.    During SVT she reports symptoms of palpitations and anxiety.  She has occasional chest discomfort and dizziness.  She is unaware of triggers/ precipitants and does not find vagal maneuvers helpful in terminating her episodes.   She presents today for EP consultation.   Today, she denies symptoms of shortness of breath, orthopnea, PND, lower extremity edema, presyncope, syncope, or neurologic sequela. The patient is tolerating medications without difficulties and is otherwise without complaint today.   Past Medical History  Diagnosis Date  . Heart palpitations   . Anxiety   . Hypertension   . PSVT (paroxysmal supraventricular tachycardia)   . Atypical chest pain   . Kidney stones   . GERD (gastroesophageal reflux disease)    Past Surgical History  Procedure Laterality Date  . Tubal ligation    . Cardiovascular stress test  06/19/2009    Post stress myocardial perfusion images show a normal pattern of perfusion in all regions. ECG positive for ischemia. Appears to be a "false-positive" ECG stress test.    Current Outpatient Prescriptions  Medication Sig Dispense Refill  . aspirin 81 MG tablet Take  81 mg by mouth daily.    [name] CALCIUM CITRATE PO Take 1 tablet by mouth daily.     . Multiple Vitamins-Minerals (CENTRUM SILVER PO) Take 1 tablet by mouth daily.     [name] omeprazole (PRILOSEC) 20 MG capsule Take 20 mg by mouth daily.    [name] triamcinolone cream (KENALOG) 0.1 % Apply 1 application topically daily as needed (itching).   3  . albuterol (PROVENTIL HFA;VENTOLIN HFA) 108 (90 BASE) MCG/ACT inhaler Inhale 2 puffs into the lungs every 4 (four) hours as needed for wheezing or shortness of breath. (Patient not taking: Reported on 12/29/2013) 1 Inhaler 0  . CRANBERRY-VITAMIN C PO Take 1 capsule by mouth daily.    . fluticasone (FLOVENT HFA) 110 MCG/ACT inhaler Inhale 1 puff into the lungs 2 (two) times daily as needed (shortness of breath or wheezing). Pt. has not started yet (12/29/13)    . metoprolol succinate (TOPROL-XL) 25 MG 24 hr tablet Take 1.5 tablets (37.5 mg total) by mouth daily. 45 tablet 11  . OVER THE COUNTER MEDICATION Take 1 tablet by mouth daily. "Mega Red"     No current facility-administered medications for this visit.    Allergies  Allergen Reactions  . Penicillins Anaphylaxis and Swelling  . Tussin Dm [Guaifenesin-Dm] Other (See Comments)    Dizziness/ Lightheadness  . Zithromax [Azithromycin]     presyncope    History   Social History  . Marital Status: Married    Spouse Name: [name]    Number of Children:  [name]  . Years of Education: [name]   Occupational History  . Not on file.   Social History Main Topics  . Smoking status: Former Research scientist (life sciences)  . Smokeless tobacco: Not on file  . Alcohol Use: No  . Drug Use: No  . Sexual Activity: Not on file   Other Topics Concern  . Not on file   Social History Narrative    FH- denies FH of sudden death or arrhythmias  ROS- + weight loss, cough, All other systems are reviewed and negative except as per the HPI above  Physical Exam: Filed Vitals:   12/29/13 0829  BP: 116/62  Pulse: 63  Height: 5\' 1"  (1.549 m)  Weight:  135 lb 3.2 oz (61.326 kg)    GEN- The patient is well appearing, alert and oriented x 3 today.   Head- normocephalic, atraumatic Eyes-  Sclera clear, conjunctiva pink Ears- hearing intact Oropharynx- clear Neck- supple, no JVP Lymph- no cervical lymphadenopathy Lungs- Clear to ausculation bilaterally, normal work of breathing Heart- Regular rate and rhythm, no murmurs, rubs or gallops, PMI not laterally displaced GI- soft, NT, ND, + BS Extremities- no clubbing, cyanosis, or edema MS- no significant deformity or atrophy Skin- no rash or lesion Psych- euthymic mood, full affect Neuro- strength and sensation are intact  EKG 11/26/13- sinus rhythm 63 bpm, PR 176, otherwise normal ekg ekg 12/01/29/13- sinus rhythm PR 210, otherwise normal ekg ekg 10/2513- short RP SVT 116 bpm with pseudo r' in V1 and pseudo s wave in inferior leads,   Assessment and Plan:  1. SVT The patient has documented narrow complex short RP SVT.  Her SVT is recurrent, symptomatic, and adenosine sensitive.  She has failed medical therapy with beta blockers. Therapeutic strategies for supraventricular tachycardia including medicine and ablation were discussed in detail with the patient today. Risk, benefits, and alternatives to EP study and radiofrequency ablation were also discussed in detail today. These risks include but are not limited to stroke, bleeding, vascular damage, tamponade, perforation, damage to the heart and other structures, AV block requiring pacemaker, worsening renal function, and death. The patient understands these risk and wishes to proceed.  We will therefore proceed with catheter ablation at the next available time.  2. HTN Stable No change required today

## 2014-01-02 ENCOUNTER — Other Ambulatory Visit: Payer: Self-pay

## 2014-01-02 ENCOUNTER — Ambulatory Visit (HOSPITAL_COMMUNITY): Payer: Medicare Other | Attending: Cardiology | Admitting: Radiology

## 2014-01-02 DIAGNOSIS — I471 Supraventricular tachycardia: Secondary | ICD-10-CM

## 2014-01-02 DIAGNOSIS — Z87891 Personal history of nicotine dependence: Secondary | ICD-10-CM | POA: Insufficient documentation

## 2014-01-02 DIAGNOSIS — K219 Gastro-esophageal reflux disease without esophagitis: Secondary | ICD-10-CM | POA: Insufficient documentation

## 2014-01-08 ENCOUNTER — Telehealth: Payer: Self-pay | Admitting: Internal Medicine

## 2014-01-08 NOTE — Telephone Encounter (Signed)
Informed patient of results/KLanier,RN  

## 2014-01-08 NOTE — Telephone Encounter (Signed)
New message ° ° ° ° ° °Want echo results °

## 2014-01-16 NOTE — Progress Notes (Signed)
Echocardiogram performed by [place]

## 2014-02-02 DIAGNOSIS — R3915 Urgency of urination: Secondary | ICD-10-CM | POA: Diagnosis not present

## 2014-02-02 DIAGNOSIS — R102 Pelvic and perineal pain: Secondary | ICD-10-CM | POA: Diagnosis not present

## 2014-02-05 ENCOUNTER — Other Ambulatory Visit (HOSPITAL_COMMUNITY): Payer: Self-pay | Admitting: Obstetrics and Gynecology

## 2014-02-05 DIAGNOSIS — R102 Pelvic and perineal pain: Secondary | ICD-10-CM | POA: Diagnosis not present

## 2014-02-05 DIAGNOSIS — R1031 Right lower quadrant pain: Secondary | ICD-10-CM

## 2014-02-08 ENCOUNTER — Ambulatory Visit (HOSPITAL_COMMUNITY)
Admission: RE | Admit: 2014-02-08 | Discharge: 2014-02-08 | Disposition: A | Discharge status: Home / Self Care | Payer: Medicare Other | Source: Ambulatory Visit | Attending: Obstetrics and Gynecology | Admitting: Obstetrics and Gynecology

## 2014-02-08 DIAGNOSIS — N200 Calculus of kidney: Secondary | ICD-10-CM | POA: Diagnosis not present

## 2014-02-08 DIAGNOSIS — R1031 Right lower quadrant pain: Secondary | ICD-10-CM

## 2014-02-08 DIAGNOSIS — R1030 Lower abdominal pain, unspecified: Secondary | ICD-10-CM | POA: Insufficient documentation

## 2014-02-08 DIAGNOSIS — K5900 Constipation, unspecified: Secondary | ICD-10-CM | POA: Insufficient documentation

## 2014-02-08 LAB — BUN: BUN: 12 mg/dL (ref 6–23)

## 2014-02-08 LAB — CREATININE, SERUM
Creatinine, Ser: 0.64 mg/dL (ref 0.50–1.10)
GFR calc Af Amer: >90 mL/min (ref >90)
GFR calc non Af Amer: >90 mL/min (ref >90)

## 2014-02-08 MED ORDER — IOHEXOL 300 MG/ML  SOLN
100.0000 mL | Freq: Once | INTRAMUSCULAR | Status: AC | PRN
  Administered 2014-02-08: 100 mL via INTRAVENOUS

## 2014-02-12 DIAGNOSIS — Z01419 Encounter for gynecological examination (general) (routine) without abnormal findings: Secondary | ICD-10-CM | POA: Diagnosis not present

## 2014-02-12 DIAGNOSIS — Z1231 Encounter for screening mammogram for malignant neoplasm of breast: Secondary | ICD-10-CM | POA: Diagnosis not present

## 2014-02-14 DIAGNOSIS — R1030 Lower abdominal pain, unspecified: Secondary | ICD-10-CM | POA: Diagnosis not present

## 2014-03-01 DIAGNOSIS — N200 Calculus of kidney: Secondary | ICD-10-CM | POA: Diagnosis not present

## 2014-03-01 DIAGNOSIS — R109 Unspecified abdominal pain: Secondary | ICD-10-CM | POA: Diagnosis not present

## 2014-03-01 DIAGNOSIS — R351 Nocturia: Secondary | ICD-10-CM | POA: Diagnosis not present

## 2014-03-20 ENCOUNTER — Telehealth: Payer: Self-pay | Admitting: Internal Medicine

## 2014-03-20 NOTE — Telephone Encounter (Signed)
Spoke with patient and let her know I would talk with Dr Rayann Heman tomorrow and call her back.  As it as been over 2 months since her appointment she may need to come back in for office visit

## 2014-03-20 NOTE — Telephone Encounter (Signed)
New message      Pt is calling to get more dates that will be available to have an ablation scheduled

## 2014-03-23 ENCOUNTER — Telehealth: Payer: Self-pay | Admitting: Cardiovascular Disease

## 2014-03-23 NOTE — Telephone Encounter (Signed)
Returned call to patient she stated she takes Metoprolol Succ 25 mg 1&1/2 daily.Stated when she breaks tablet in half it breaks into different pieces.Stated when she takes the 1/2 tablet makes her nauseated.Stated she wanted Dr.Berry to prescribe a different mg so she did not have to cut in half.Dr.Berry out of office will send to him for advice.

## 2014-03-23 NOTE — Telephone Encounter (Signed)
Brittany Harvey is calling because she has been taking 1and half of her Metoprolol , but when she is cutting it breaks up and she feels sick afterward , would like to know if she could get half prescribed to her . Please call    Thanks

## 2014-03-28 ENCOUNTER — Telehealth: Payer: Self-pay | Admitting: *Deleted

## 2014-03-28 NOTE — Telephone Encounter (Signed)
Left message for patient to call back to see about scheduling her ablation with Dr. Rayann Heman

## 2014-03-28 NOTE — Telephone Encounter (Signed)
Left message for patient to call me back to schedule procedure

## 2014-03-30 NOTE — Telephone Encounter (Signed)
Refer to [facility] for this question

## 2014-04-02 NOTE — Telephone Encounter (Signed)
Pt is returning Cheryl's call  ° °Thanks  °

## 2014-04-02 NOTE — Telephone Encounter (Signed)
Returned call to patient no answer.LMTC. 

## 2014-04-02 NOTE — Telephone Encounter (Signed)
Message sent to [facility].

## 2014-04-02 NOTE — Telephone Encounter (Signed)
Is she taking this with food?  Have her start with this as they don't make a 12.5 mg tablet, so will have to review with MD if want to go up or down on dose.  HR in low 60s.  Can send to either Dr. Gwenlyn Found or Dr. Rayann Heman for review.

## 2014-04-02 NOTE — Telephone Encounter (Signed)
Returned call to patient she stated she takes metoprolol with food.Stated she was concerned her pulse was too low.Stated pulse has been ranging 54 to 56 bpm.Patient reassured 54 to 56 pulse is good.Advised to continue metoprolol succ. 25 mg 1&1/2 tablets daily.Advised to call back if needed.

## 2014-04-02 NOTE — Telephone Encounter (Signed)
Returning your call. °

## 2014-04-23 DIAGNOSIS — J309 Allergic rhinitis, unspecified: Secondary | ICD-10-CM | POA: Diagnosis not present

## 2014-04-23 DIAGNOSIS — Z79899 Other long term (current) drug therapy: Secondary | ICD-10-CM | POA: Diagnosis not present

## 2014-04-23 DIAGNOSIS — R002 Palpitations: Secondary | ICD-10-CM | POA: Diagnosis not present

## 2014-04-23 DIAGNOSIS — I10 Essential (primary) hypertension: Secondary | ICD-10-CM | POA: Diagnosis not present

## 2014-04-23 NOTE — Telephone Encounter (Signed)
Left message for patient to call if she wants to proceed with ablation still

## 2014-04-30 DIAGNOSIS — N63 Unspecified lump in breast: Secondary | ICD-10-CM | POA: Diagnosis not present

## 2014-05-01 ENCOUNTER — Other Ambulatory Visit: Payer: Self-pay | Admitting: Obstetrics and Gynecology

## 2014-05-01 DIAGNOSIS — R229 Localized swelling, mass and lump, unspecified: Principal | ICD-10-CM

## 2014-05-01 DIAGNOSIS — IMO0002 Reserved for concepts with insufficient information to code with codable children: Secondary | ICD-10-CM

## 2014-05-04 ENCOUNTER — Ambulatory Visit
Admission: RE | Admit: 2014-05-04 | Discharge: 2014-05-04 | Disposition: A | Discharge status: Home / Self Care | Payer: PRIVATE HEALTH INSURANCE | Source: Ambulatory Visit | Attending: Obstetrics and Gynecology | Admitting: Obstetrics and Gynecology

## 2014-05-04 ENCOUNTER — Ambulatory Visit
Admission: RE | Admit: 2014-05-04 | Discharge: 2014-05-04 | Disposition: A | Discharge status: Home / Self Care | Payer: Medicare Other | Source: Ambulatory Visit | Attending: Obstetrics and Gynecology | Admitting: Obstetrics and Gynecology

## 2014-05-04 DIAGNOSIS — R229 Localized swelling, mass and lump, unspecified: Principal | ICD-10-CM

## 2014-05-04 DIAGNOSIS — IMO0002 Reserved for concepts with insufficient information to code with codable children: Secondary | ICD-10-CM

## 2014-05-04 DIAGNOSIS — N63 Unspecified lump in breast: Secondary | ICD-10-CM | POA: Diagnosis not present

## 2014-05-25 ENCOUNTER — Ambulatory Visit: Payer: PRIVATE HEALTH INSURANCE | Admitting: Cardiovascular Disease

## 2014-06-18 ENCOUNTER — Telehealth: Payer: Self-pay | Admitting: Cardiovascular Disease

## 2014-06-18 ENCOUNTER — Telehealth: Payer: Self-pay | Admitting: *Deleted

## 2014-06-18 MED ORDER — METOPROLOL SUCCINATE ER 25 MG PO TB24: 37.5000 mg | ORAL_TABLET | Freq: Every day | ORAL | Status: DC

## 2014-06-18 NOTE — Telephone Encounter (Signed)
Brittany Harvey will be faxing you a form to get her Metoprolol approved/

## 2014-06-18 NOTE — Telephone Encounter (Signed)
Patient states she is changing to mail order from local pharmacy. The mail order was sending a fax to reflect the change. RN informed patient will e-send medication.

## 2014-07-02 DIAGNOSIS — R5383 Other fatigue: Secondary | ICD-10-CM | POA: Diagnosis not present

## 2014-07-02 DIAGNOSIS — I10 Essential (primary) hypertension: Secondary | ICD-10-CM | POA: Diagnosis not present

## 2014-07-03 ENCOUNTER — Other Ambulatory Visit: Payer: Self-pay | Admitting: *Deleted

## 2014-07-17 DIAGNOSIS — R5383 Other fatigue: Secondary | ICD-10-CM | POA: Diagnosis not present

## 2014-07-17 DIAGNOSIS — E559 Vitamin D deficiency, unspecified: Secondary | ICD-10-CM | POA: Diagnosis not present

## 2014-07-24 ENCOUNTER — Encounter: Payer: Self-pay | Admitting: Cardiovascular Disease

## 2014-07-24 ENCOUNTER — Ambulatory Visit (INDEPENDENT_AMBULATORY_CARE_PROVIDER_SITE_OTHER): Payer: Medicare Other | Admitting: Cardiovascular Disease

## 2014-07-24 VITALS — BP 144/78 | HR 69 | Ht 62.0 in | Wt 137.4 lb

## 2014-07-24 DIAGNOSIS — I10 Essential (primary) hypertension: Secondary | ICD-10-CM | POA: Insufficient documentation

## 2014-07-24 DIAGNOSIS — I471 Supraventricular tachycardia: Secondary | ICD-10-CM | POA: Diagnosis not present

## 2014-07-24 NOTE — Patient Instructions (Signed)
Your physician wants you to follow-up in: 1 Year. You will receive a reminder letter in the mail two months in advance. If you don't receive a letter, please call our office to schedule the follow-up appointment.  

## 2014-07-24 NOTE — Assessment & Plan Note (Signed)
History of hypertension blood pressure measured today at 144/78 on metoprolol. Continue current meds at current dosing

## 2014-07-24 NOTE — Progress Notes (Signed)
07/24/2014 Brittany Harvey   February 03, 1945  [id number]  Primary Physician PROVIDER NOT IN SYSTEM Primary Cardiologist: Lorretta Harp MD [name]   HPI:   Brittany Harvey is a 69 year old moderately overweight widowed Caucasian female who I last saw 07/05/13. She saw Dr. Sallyanne Kuster back in 2011.[name] She has a history of PSVT remotely back in 2004. She had a false positive GXT back in 2011 which led to a Myoview which was normal. She was admitted to the hospital back in March after I saw her in the office for chest pain/rule out microinfarction. A stress MR I was completely normal. She said since and no recurrent symptoms. The presumed etiology of his of her chest pain was reflux. She saw [name] in the office 11/27/13 after she had been seen at [facility] emergency room for PSVT requiring adenosine conversion. She's had no recurrent symptoms.   Current Outpatient Prescriptions  Medication Sig Dispense Refill  . albuterol (PROVENTIL HFA;VENTOLIN HFA) 108 (90 BASE) MCG/ACT inhaler Inhale 2 puffs into the lungs every 4 (four) hours as needed for wheezing or shortness of breath. 1 Inhaler 0  . aspirin 81 MG tablet Take 81 mg by mouth daily.    [name] CALCIUM CITRATE PO Take 1 tablet by mouth daily.     [name] CRANBERRY-VITAMIN C PO Take 1 capsule by mouth daily.    . ergocalciferol (VITAMIN D2) 50000 UNITS capsule Take 50,000 Units by mouth once a week.    . fluticasone (FLOVENT HFA) 110 MCG/ACT inhaler Inhale 1 puff into the lungs 2 (two) times daily as needed (shortness of breath or wheezing). Pt. has not started yet (12/29/13)    . metoprolol succinate (TOPROL-XL) 25 MG 24 hr tablet Take 1.5 tablets (37.5 mg total) by mouth daily. 135 tablet 3  . Multiple Vitamins-Minerals (CENTRUM SILVER PO) Take 1 tablet by mouth daily.     [name] omeprazole (PRILOSEC) 20 MG capsule Take 20 mg by mouth daily.    [name] OVER THE COUNTER MEDICATION Take 1 tablet by mouth daily. "Mega Red"    . Probiotic Product  (RESTORA) CAPS Take 1 capsule by mouth daily.    [name] triamcinolone cream (KENALOG) 0.1 % Apply 1 application topically daily as needed (itching).   3   No current facility-administered medications for this visit.    Allergies  Allergen Reactions  . Penicillins Anaphylaxis and Swelling  . Tussin Dm [Guaifenesin-Dm] Other (See Comments)    Dizziness/ Lightheadness  . Zithromax [Azithromycin]     presyncope    History   Social History  . Marital Status: Married    Spouse Name: [name]  . Number of Children: [name]  . Years of Education: [name]   Occupational History  . Not on file.   Social History Main Topics  . Smoking status: Former Research scientist (life sciences)  . Smokeless tobacco: Not on file  . Alcohol Use: No  . Drug Use: No  . Sexual Activity: Not on file   Other Topics Concern  . Not on file   Social History Narrative     Review of Systems: General: negative for chills, fever, night sweats or weight changes.  Cardiovascular: negative for chest pain, dyspnea on exertion, edema, orthopnea, palpitations, paroxysmal nocturnal dyspnea or shortness of breath Dermatological: negative for rash Respiratory: negative for cough or wheezing Urologic: negative for hematuria Abdominal: negative for nausea, vomiting, diarrhea, bright red blood per rectum, melena, or hematemesis Neurologic: negative for visual changes, syncope, or dizziness All  other systems reviewed and are otherwise negative except as noted above.    Blood pressure 144/78, pulse 69, height 5\' 2"  (1.575 m), weight 137 lb 6.4 oz (62.324 kg).  General appearance: alert and no distress Neck: no adenopathy, no carotid bruit, no JVD, supple, symmetrical, trachea midline and thyroid not enlarged, symmetric, no tenderness/mass/nodules Lungs: clear to auscultation bilaterally Heart: regular rate and rhythm, S1, S2 normal, no murmur, click, rub or gallop Extremities: extremities normal, atraumatic, no cyanosis or edema  EKG normal sinus rhythm  at 69 without ST or T-wave changes. Personally reviewed this EKG  ASSESSMENT AND PLAN:   PSVT (paroxysmal supraventricular tachycardia) History of PSVT with an episode back in 2011 and most recently 11/19/13. She was taken to the ER by EMS and converted with IV adenosine. She had a recurrent episode on the way home that day and her beta blockers were adjusted. She was referred to Dr. Rayann Heman for consideration of ablation but never went. She's had no recurrent symptoms.  Essential hypertension History of hypertension blood pressure measured today at 144/78 on metoprolol. Continue current meds at current dosing      Lorretta Harp MD [facility], [facility] 07/24/2014 3:38 PM

## 2014-07-24 NOTE — Assessment & Plan Note (Signed)
History of PSVT with an episode back in 2011 and most recently 11/19/13. She was taken to the ER by EMS and converted with IV adenosine. She had a recurrent episode on the way home that day and her beta blockers were adjusted. She was referred to Dr. Rayann Heman for consideration of ablation but never went. She's had no recurrent symptoms.

## 2014-07-25 ENCOUNTER — Encounter: Payer: Self-pay | Admitting: Cardiovascular Disease

## 2014-09-09 ENCOUNTER — Emergency Department (HOSPITAL_COMMUNITY): Payer: Medicare Other

## 2014-09-09 ENCOUNTER — Emergency Department (HOSPITAL_COMMUNITY)
Admission: EM | Admit: 2014-09-09 | Discharge: 2014-09-09 | Disposition: A | Discharge status: Home / Self Care | Payer: Medicare Other | Attending: Emergency Medicine | Admitting: Emergency Medicine

## 2014-09-09 ENCOUNTER — Encounter (HOSPITAL_COMMUNITY): Payer: Self-pay | Admitting: *Deleted

## 2014-09-09 DIAGNOSIS — Z87442 Personal history of urinary calculi: Secondary | ICD-10-CM | POA: Insufficient documentation

## 2014-09-09 DIAGNOSIS — Z8719 Personal history of other diseases of the digestive system: Secondary | ICD-10-CM | POA: Diagnosis not present

## 2014-09-09 DIAGNOSIS — Z7982 Long term (current) use of aspirin: Secondary | ICD-10-CM | POA: Insufficient documentation

## 2014-09-09 DIAGNOSIS — R0789 Other chest pain: Secondary | ICD-10-CM | POA: Diagnosis not present

## 2014-09-09 DIAGNOSIS — I10 Essential (primary) hypertension: Secondary | ICD-10-CM | POA: Diagnosis not present

## 2014-09-09 DIAGNOSIS — Z880 Allergy status to penicillin: Secondary | ICD-10-CM | POA: Diagnosis not present

## 2014-09-09 DIAGNOSIS — Z79899 Other long term (current) drug therapy: Secondary | ICD-10-CM | POA: Insufficient documentation

## 2014-09-09 DIAGNOSIS — Z87891 Personal history of nicotine dependence: Secondary | ICD-10-CM | POA: Insufficient documentation

## 2014-09-09 DIAGNOSIS — R0602 Shortness of breath: Secondary | ICD-10-CM | POA: Diagnosis not present

## 2014-09-09 DIAGNOSIS — Z8659 Personal history of other mental and behavioral disorders: Secondary | ICD-10-CM | POA: Insufficient documentation

## 2014-09-09 DIAGNOSIS — R079 Chest pain, unspecified: Secondary | ICD-10-CM | POA: Diagnosis present

## 2014-09-09 LAB — CBC
HCT: 37.4 % (ref 36.0–46.0)
Hemoglobin: 13.3 g/dL (ref 12.0–15.0)
MCH: 31.1 pg (ref 26.0–34.0)
MCHC: 35.6 g/dL (ref 30.0–36.0)
MCV: 87.6 fL (ref 78.0–100.0)
Platelets: 228 10*3/uL (ref 150–400)
RBC: 4.27 MIL/uL (ref 3.87–5.11)
RDW: 13 % (ref 11.5–15.5)
WBC: 7.7 10*3/uL (ref 4.0–10.5)

## 2014-09-09 LAB — BASIC METABOLIC PANEL
Anion gap: 12 (ref 5–15)
BUN: 11 mg/dL (ref 6–20)
CO2: 20 mmol/L — ABNORMAL LOW (ref 22–32)
Calcium: 9.2 mg/dL (ref 8.9–10.3)
Chloride: 108 mmol/L (ref 101–111)
Creatinine, Ser: 0.68 mg/dL (ref 0.44–1.00)
GFR calc Af Amer: >60 mL/min (ref >60)
GFR calc non Af Amer: >60 mL/min (ref >60)
Glucose, Bld: 90 mg/dL (ref 65–99)
Potassium: 3.4 mmol/L — ABNORMAL LOW (ref 3.5–5.1)
Sodium: 140 mmol/L (ref 135–145)

## 2014-09-09 LAB — I-STAT TROPONIN, ED: Troponin i, poc: 0 ng/mL (ref 0.00–0.08)

## 2014-09-09 MED ORDER — SUCRALFATE 1 GM/10ML PO SUSP
1.0000 g | Freq: Three times a day (TID) | ORAL | Status: DC
  Administered 2014-09-09: 1 g via ORAL
  Filled 2014-09-09 (×2): qty 10

## 2014-09-09 MED ORDER — SUCRALFATE 1 GM/10ML PO SUSP: 1.0000 g | Freq: Three times a day (TID) | ORAL | Status: DC

## 2014-09-09 MED ORDER — OMEPRAZOLE 20 MG PO CPDR: 20.0000 mg | DELAYED_RELEASE_CAPSULE | Freq: Every day | ORAL | Status: DC

## 2014-09-09 NOTE — ED Provider Notes (Signed)
CSN: [id number]     Arrival date & time 09/09/14  1704 History   First MD Initiated Contact with Patient 09/09/14 1946     Chief Complaint  Patient presents with  . Chest Pain     (Consider location/radiation/quality/duration/timing/severity/associated sxs/prior Treatment) HPI  Patient presents with concern of ongoing episodic chest pain. The pain is focally about the sternum, intermittent, occurring without clear precipitant. The pain episodes are brief, sharp, occasionally with concurrent focal points of discomfort in the upper thoracic area, though not consistently. There is also occasional dyspnea, though not with chest pain. No new anorexia, nausea and lightheadedness, syncope. Patient states that symptoms are similar, though not as pronounced as those she experienced during a similar emergency department presentation 16 months ago.  Patient does not smoke, does not drink, works in a healthcare facility, moving patients, and the sizes that her symptoms may be secondary to muscle strain.    Past Medical History  Diagnosis Date  . Heart palpitations   . Anxiety   . Hypertension   . PSVT (paroxysmal supraventricular tachycardia)   . Atypical chest pain   . Kidney stones   . GERD (gastroesophageal reflux disease)    Past Surgical History  Procedure Laterality Date  . Tubal ligation    . Cardiovascular stress test  06/19/2009    Post stress myocardial perfusion images show a normal pattern of perfusion in all regions. ECG positive for ischemia. Appears to be a "false-positive" ECG stress test.   No family history on file. Social History  Substance Use Topics  . Smoking status: Former Research scientist (life sciences)  . Smokeless tobacco: None  . Alcohol Use: No   OB History    No data available     Review of Systems  Constitutional:       Per HPI, otherwise negative  HENT:       Per HPI, otherwise negative  Respiratory:       Per HPI, otherwise negative  Cardiovascular:       Per HPI,  otherwise negative  Gastrointestinal: Negative for vomiting.  Endocrine:       Negative aside from HPI  Genitourinary:       Neg aside from HPI   Musculoskeletal:       Per HPI, otherwise negative  Skin: Negative.   Neurological: Negative for syncope.      Allergies  Penicillins; Tussin dm; and Zithromax  Home Medications   Prior to Admission medications   Medication Sig Start Date End Date Taking? Authorizing Provider  acetaminophen (TYLENOL) 500 MG tablet Take 500 mg by mouth daily as needed (pain).   Yes Historical Provider, MD  albuterol (PROVENTIL HFA;VENTOLIN HFA) 108 (90 BASE) MCG/ACT inhaler Inhale 2 puffs into the lungs every 4 (four) hours as needed for wheezing or shortness of breath. 03/18/13  Yes Merryl Hacker, MD  aspirin EC 81 MG tablet Take 81 mg by mouth daily.   Yes Historical Provider, MD  Calcium Citrate-Vitamin D (CALCIUM CITRATE + D PO) Take 1 tablet by mouth daily.   Yes Historical Provider, MD  ergocalciferol (VITAMIN D2) 50000 UNITS capsule Take 50,000 Units by mouth once a week. Wednesday mornings   Yes Historical Provider, MD  ipratropium (ATROVENT HFA) 17 MCG/ACT inhaler Inhale 2 puffs into the lungs every 4 (four) hours as needed for wheezing.   Yes Historical Provider, MD  metoprolol succinate (TOPROL-XL) 25 MG 24 hr tablet Take 1.5 tablets (37.5 mg total) by mouth daily. Patient taking differently: Take  12.5-25 mg by mouth 2 (two) times daily. Take 1 tablet (25 mg) every morning and 1/2 tablet (12.5 mg) every night 06/18/14  Yes Lorretta Harp, MD  Multiple Vitamin (MULTIVITAMIN WITH MINERALS) TABS tablet Take 1 tablet by mouth daily. Centrum Silver   Yes Historical Provider, MD  omeprazole (PRILOSEC OTC) 20 MG tablet Take 20 mg by mouth See admin instructions. Take 1 tablet (20 mg) by mouth daily for 14 days if needed for acid reflux   Yes Historical Provider, MD  triamcinolone cream (KENALOG) 0.1 % Apply 1 application topically daily as needed  (itching/eczema).  11/06/13  Yes Historical Provider, MD   BP 148/57 mmHg  Pulse 61  Temp(Src) 97.8 F (36.6 C) (Oral)  Resp 12  Ht 5\' 1"  (1.549 m)  Wt 133 lb 12.8 oz (60.691 kg)  BMI 25.29 kg/m2  SpO2 100% Physical Exam  Constitutional: She is oriented to person, place, and time. She appears well-developed and well-nourished. No distress.  HENT:  Head: Normocephalic and atraumatic.  Eyes: Conjunctivae and EOM are normal.  Cardiovascular: Normal rate, regular rhythm and intact distal pulses.   Pulmonary/Chest: Effort normal and breath sounds normal. No stridor. No respiratory distress.  Abdominal: She exhibits no distension.  Musculoskeletal: She exhibits no edema.  Neurological: She is alert and oriented to person, place, and time. No cranial nerve deficit.  Skin: Skin is warm and dry.  Psychiatric: She has a normal mood and affect.  Nursing note and vitals reviewed.   ED Course  Procedures (including critical care time) Labs Review Labs Reviewed  BASIC METABOLIC PANEL - Abnormal; Notable for the following:    Potassium 3.4 (*)    CO2 20 (*)    All other components within normal limits  CBC  I-STAT TROPOININ, ED    Imaging Review Dg Chest 2 View  09/09/2014   CLINICAL DATA:  Chest pain and shortness of Breath  EXAM: CHEST - 2 VIEW  COMPARISON:  04/25/2013  FINDINGS: The heart size and mediastinal contours are within normal limits. Both lungs are clear. The visualized skeletal structures are unremarkable. An external lead is noted over the upper right chest.  IMPRESSION: No acute abnormality noted.   Electronically Signed   By: Inez Catalina M.D.   On: 09/09/2014 20:52   I, Ajamu Maxon, personally reviewed and evaluated these images and lab results as part of my medical decision-making.   EKG Interpretation   Date/Time:  Sunday September 09 2014 17:08:32 EDT Ventricular Rate:  65 PR Interval:  184 QRS Duration: 78 QT Interval:  406 QTC Calculation: 422 R Axis:    78 Text Interpretation:  Normal sinus rhythm Normal ECG Confirmed by Jeneen Rinks   MD, [place] ([id number]) on 09/09/2014 5:13:36 PM     Chart review demonstrates cardiac evaluation 16 months ago after presentation for similar chest pain.  Patient had echocardiogram within the past year  Study Conclusions  - Left ventricle: The cavity size was normal. Systolic function was   vigorous. The estimated ejection fraction was in the range of 65%   to 70%. Wall motion was normal; there were no regional wall   motion abnormalities. Left ventricular diastolic function   parameters were normal. - Aortic valve: There was no regurgitation. - Aortic root: The aortic root was normal in size. - Left atrium: The atrium was normal in size. - Right ventricle: The cavity size was normal. Wall thickness was   normal. Systolic function was normal. - Right atrium: The atrium  was normal in size. - Atrial septum: No defect or patent foramen ovale was identified. - Tricuspid valve: There was mild regurgitation. - Pulmonic valve: There was no significant regurgitation. - Pulmonary arteries: Systolic pressure was within the normal   range. - Inferior vena cava: The vessel was normal in size. - Pericardium, extracardiac: There was no pericardial effusion.  Impressions:  - Mild tricuspid regurgitation, otherwise normal study.  On repeat exam the patient appears calm, with discussed all findings at length.  No new chest pain, or other new complaints. She adds that she is anticipating endoscopy, which was recently recommended by her primary care physician for further evaluation of her discomfort.   MDM  Patient presents with chest pain. Here the patient is awake and alert, with minimal ongoing complaints. Patient has minimal risk profile for ACS, and with reassuring EKG, labs, resolution of pain, there is low suspicion for occult ischemia. Patient improved here, received GI cocktail, which was seemingly  contributory. With suspicion for gastroesophageal etiology, patient was discharged in stable condition to follow-up with primary care.  Carmin Muskrat, MD 09/09/14 2231

## 2014-09-09 NOTE — Discharge Instructions (Signed)
As discussed, your evaluation today has been largely reassuring.  But, it is important that you monitor your condition carefully, and do not hesitate to return to the ED if you develop new, or concerning changes in your condition. ? ?Otherwise, please follow-up with your physician for appropriate ongoing care. ? ?

## 2014-09-09 NOTE — ED Notes (Signed)
Pt reports sweating and "chest discomfort" intermittently for 2 weeks. Pt states that she has chills as well. Pain is worsened with coughing.

## 2014-09-25 DIAGNOSIS — K219 Gastro-esophageal reflux disease without esophagitis: Secondary | ICD-10-CM | POA: Diagnosis not present

## 2014-09-25 DIAGNOSIS — Z09 Encounter for follow-up examination after completed treatment for conditions other than malignant neoplasm: Secondary | ICD-10-CM | POA: Diagnosis not present

## 2014-09-25 DIAGNOSIS — R0600 Dyspnea, unspecified: Secondary | ICD-10-CM | POA: Diagnosis not present

## 2014-09-25 DIAGNOSIS — Z79899 Other long term (current) drug therapy: Secondary | ICD-10-CM | POA: Diagnosis not present

## 2014-10-26 DIAGNOSIS — R1013 Epigastric pain: Secondary | ICD-10-CM | POA: Diagnosis not present

## 2014-10-26 DIAGNOSIS — K219 Gastro-esophageal reflux disease without esophagitis: Secondary | ICD-10-CM | POA: Diagnosis not present

## 2014-10-26 DIAGNOSIS — R634 Abnormal weight loss: Secondary | ICD-10-CM | POA: Diagnosis not present

## 2014-11-08 DIAGNOSIS — K5730 Diverticulosis of large intestine without perforation or abscess without bleeding: Secondary | ICD-10-CM | POA: Diagnosis not present

## 2014-11-08 DIAGNOSIS — Z86010 Personal history of colonic polyps: Secondary | ICD-10-CM | POA: Diagnosis not present

## 2014-11-08 DIAGNOSIS — K219 Gastro-esophageal reflux disease without esophagitis: Secondary | ICD-10-CM | POA: Diagnosis not present

## 2014-11-26 DIAGNOSIS — I10 Essential (primary) hypertension: Secondary | ICD-10-CM | POA: Diagnosis not present

## 2014-11-26 DIAGNOSIS — K219 Gastro-esophageal reflux disease without esophagitis: Secondary | ICD-10-CM | POA: Diagnosis not present

## 2014-11-26 DIAGNOSIS — K5900 Constipation, unspecified: Secondary | ICD-10-CM | POA: Diagnosis not present

## 2015-02-27 DIAGNOSIS — Z1231 Encounter for screening mammogram for malignant neoplasm of breast: Secondary | ICD-10-CM | POA: Diagnosis not present

## 2015-02-27 DIAGNOSIS — Z01419 Encounter for gynecological examination (general) (routine) without abnormal findings: Secondary | ICD-10-CM | POA: Diagnosis not present

## 2015-02-27 DIAGNOSIS — N958 Other specified menopausal and perimenopausal disorders: Secondary | ICD-10-CM | POA: Diagnosis not present

## 2015-02-27 DIAGNOSIS — M8588 Other specified disorders of bone density and structure, other site: Secondary | ICD-10-CM | POA: Diagnosis not present

## 2015-02-27 DIAGNOSIS — Z124 Encounter for screening for malignant neoplasm of cervix: Secondary | ICD-10-CM | POA: Diagnosis not present

## 2015-02-27 DIAGNOSIS — Z6824 Body mass index (BMI) 24.0-24.9, adult: Secondary | ICD-10-CM | POA: Diagnosis not present

## 2015-03-30 IMAGING — CR DG CHEST 2V
2 series · 2 of 2 positions shown · non-contrast
Comparison: 03/18/2013

CLINICAL DATA: Persistent cough and shortness of breath.

EXAM:
CHEST  2 VIEW

[view not recorded (1 of 2)]
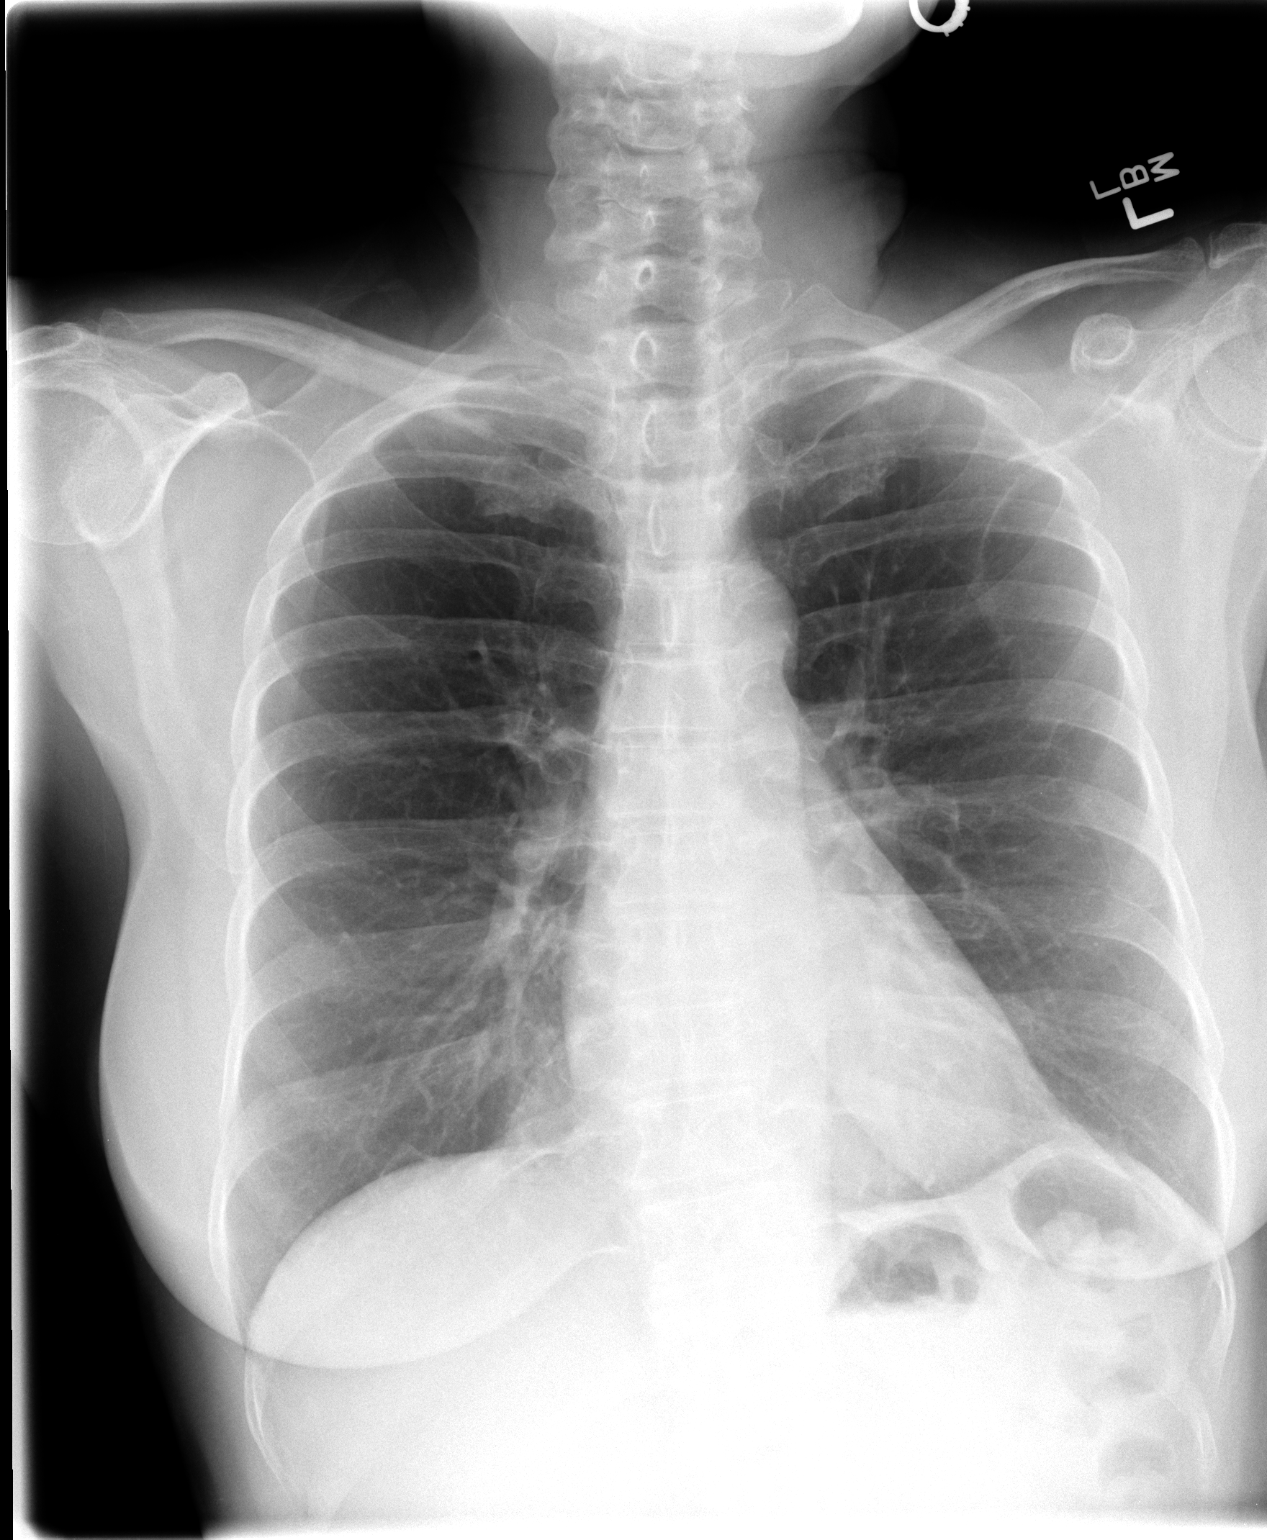

[view not recorded (2 of 2)]
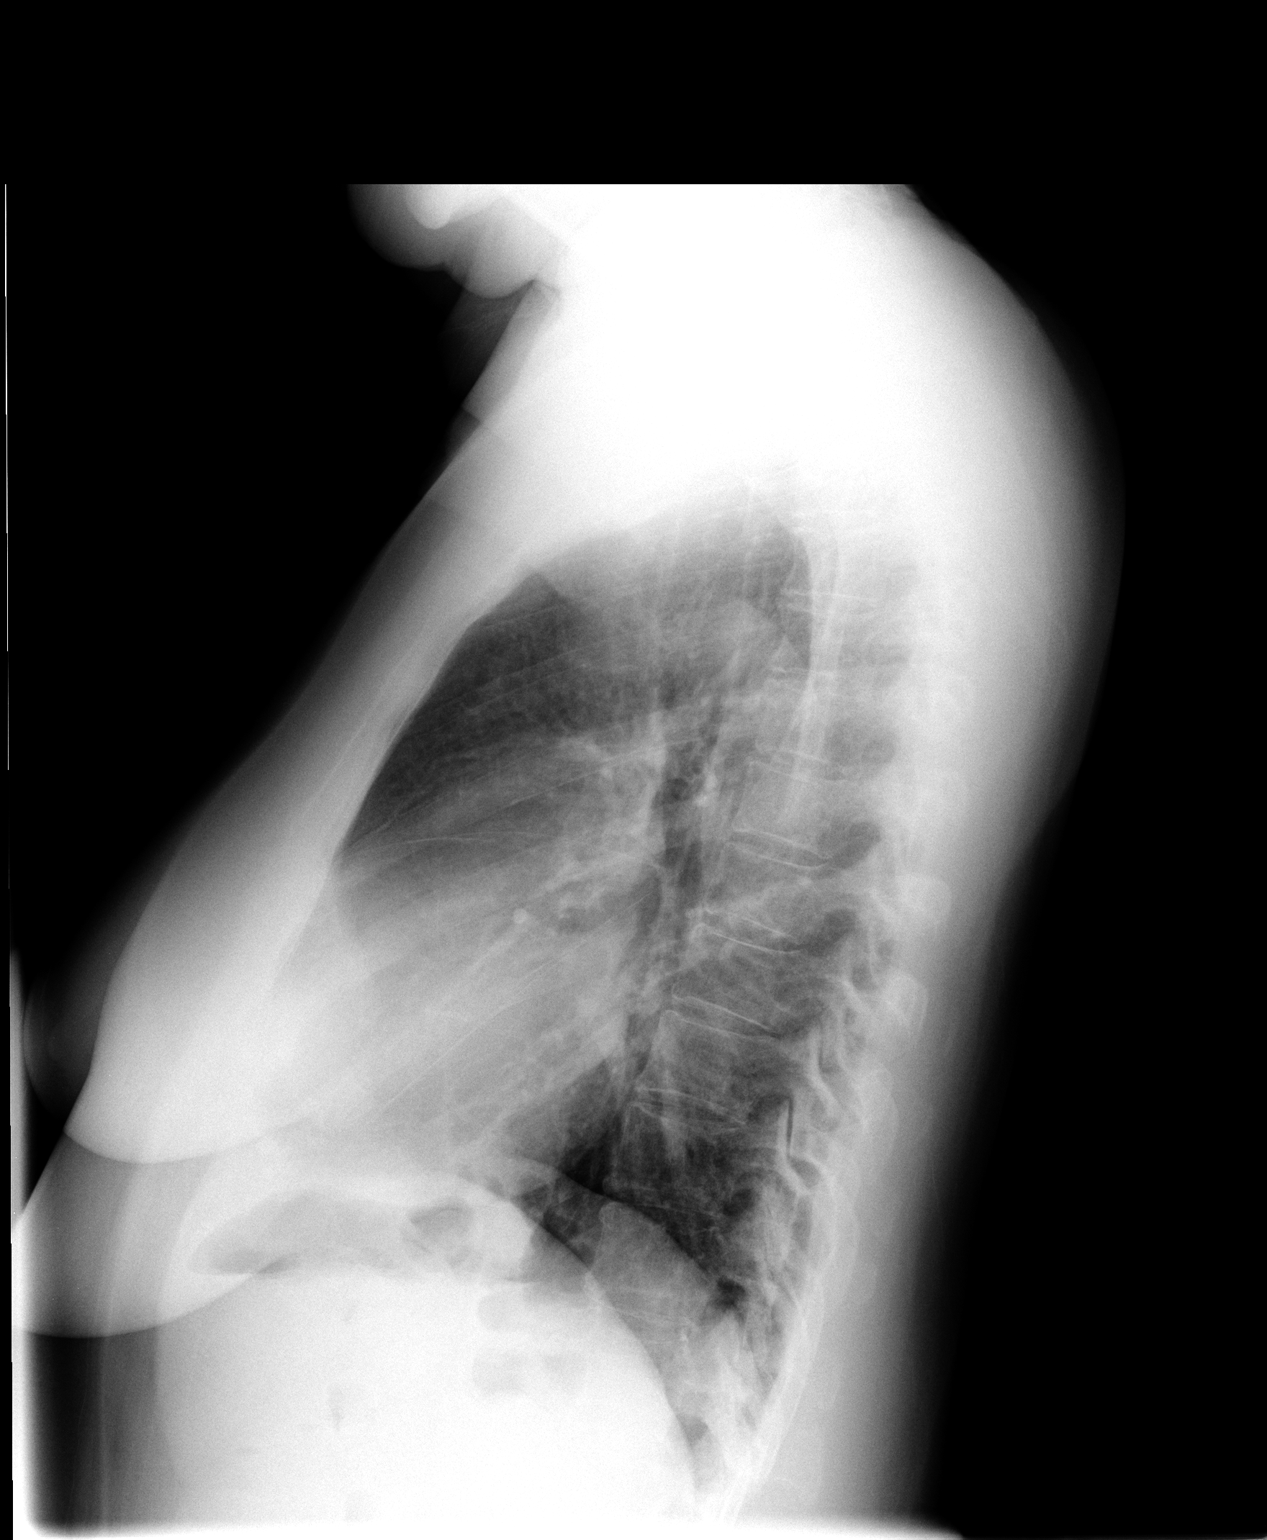

[2 of 2 positions shown; findings below may reference images not displayed]

FINDINGS: The heart size and mediastinal contours are within normal limits.
Both lungs are clear. The visualized skeletal structures are
unremarkable.
IMPRESSION: Normal exam.

## 2015-04-15 IMAGING — CR DG CHEST 2V
2 series · 2 of 2 positions shown · non-contrast
Comparison: 04/09/2013

CLINICAL DATA: Chest pain and shortness of breath

EXAM:
CHEST  2 VIEW

[w chest pa]
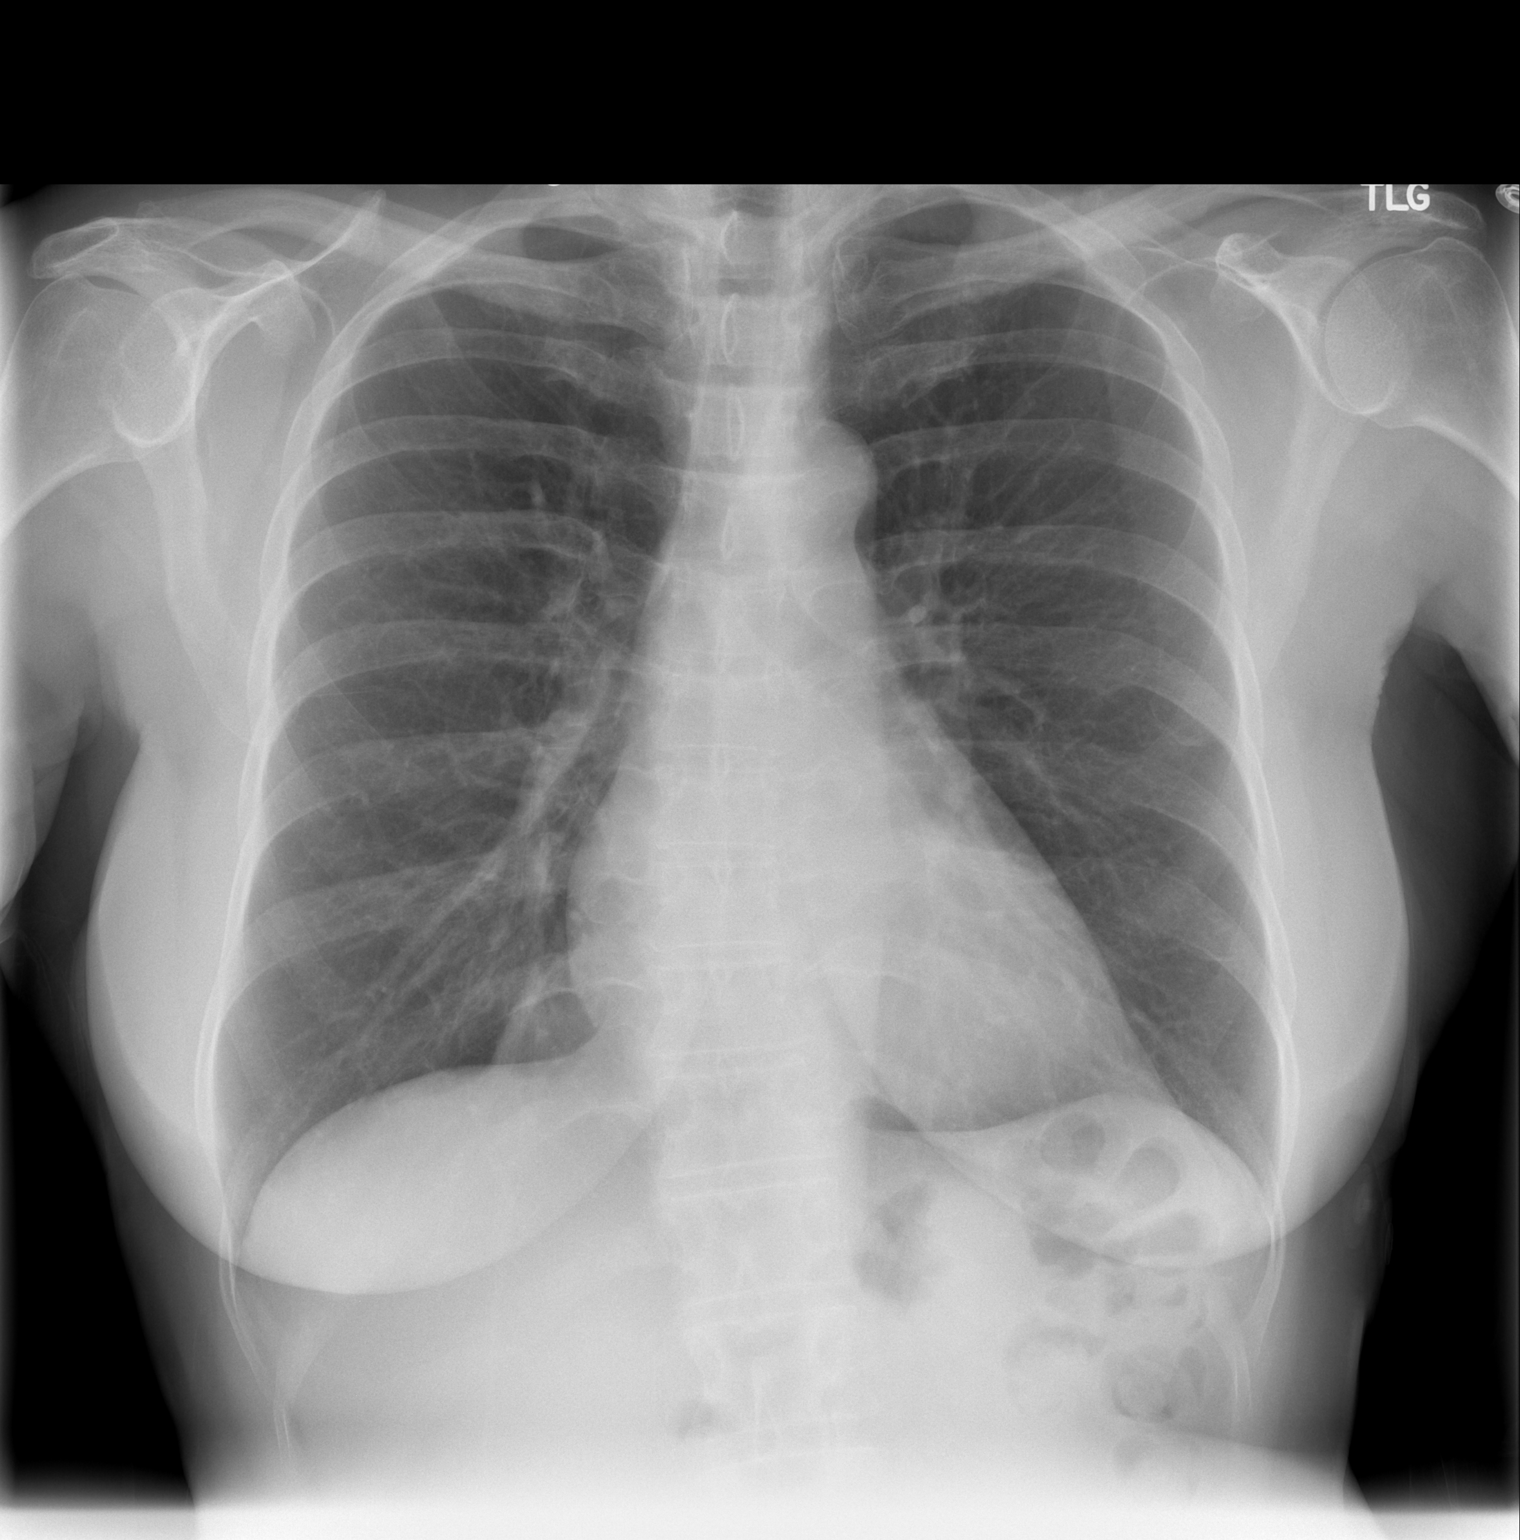

[w chest lat]
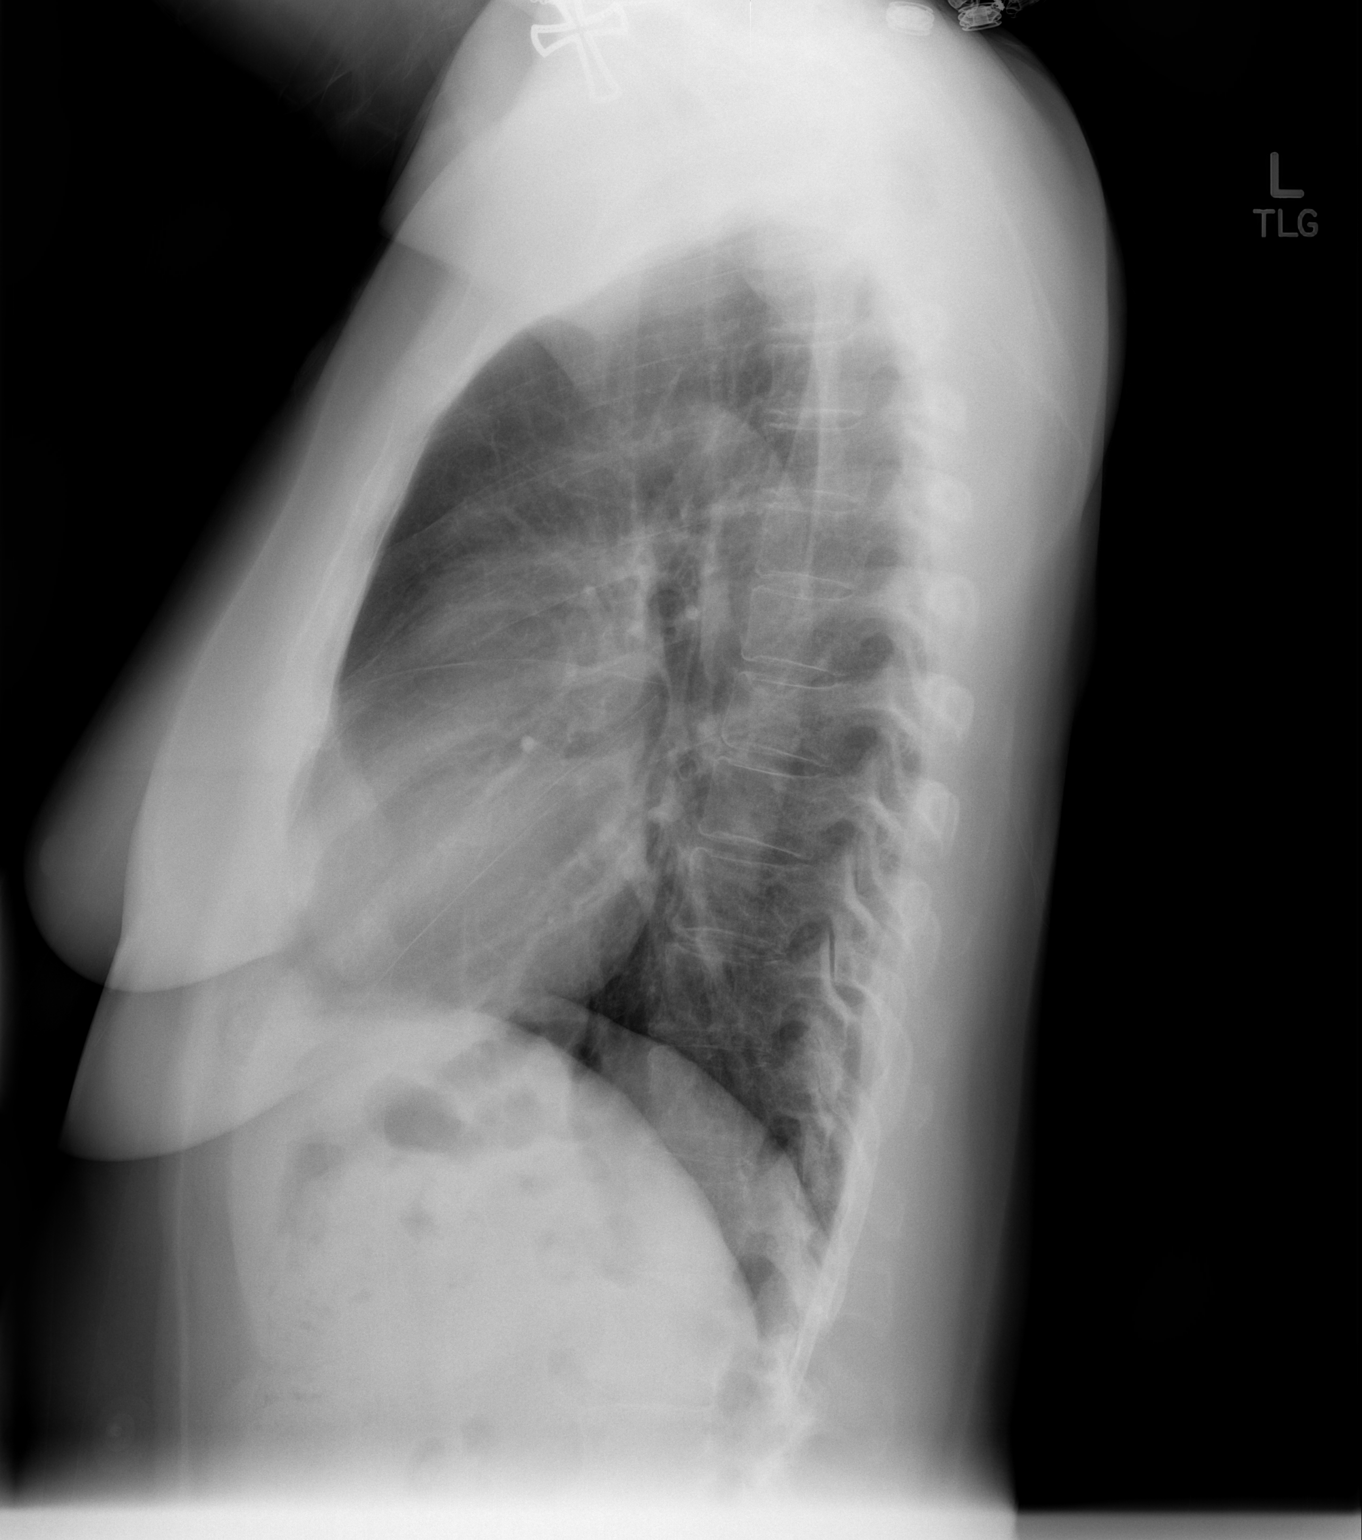

[2 of 2 positions shown; findings below may reference images not displayed]

FINDINGS: The heart size and mediastinal contours are within normal limits.
Both lungs are clear. The visualized skeletal structures are
unremarkable.
IMPRESSION: No active cardiopulmonary disease.

## 2015-05-09 DIAGNOSIS — E559 Vitamin D deficiency, unspecified: Secondary | ICD-10-CM | POA: Diagnosis not present

## 2015-05-09 DIAGNOSIS — R7309 Other abnormal glucose: Secondary | ICD-10-CM | POA: Diagnosis not present

## 2015-05-09 DIAGNOSIS — I10 Essential (primary) hypertension: Secondary | ICD-10-CM | POA: Diagnosis not present

## 2015-05-09 DIAGNOSIS — Z23 Encounter for immunization: Secondary | ICD-10-CM | POA: Diagnosis not present

## 2015-05-09 DIAGNOSIS — Z0000 Encounter for general adult medical examination without abnormal findings: Secondary | ICD-10-CM | POA: Diagnosis not present

## 2015-05-09 LAB — CBC AND DIFFERENTIAL
HCT: 38 % (ref 36–46)
Hemoglobin: 12.9 g/dL (ref 12.0–16.0)
Neutrophils Absolute: 4 /uL
Platelets: 241 10*3/uL (ref 150–399)
WBC: 6.8 10^3/mL

## 2015-05-09 LAB — BASIC METABOLIC PANEL
BUN: 13 mg/dL (ref 4–21)
Creatinine: 0.7 mg/dL (ref <=1.1)
Glucose: 79 mg/dL
Potassium: 3.6 mmol/L (ref 3.4–5.3)
Sodium: 141 mmol/L (ref 137–147)

## 2015-05-09 LAB — LIPID PANEL
Cholesterol: 218 mg/dL — AB (ref 0–200)
HDL: 90 mg/dL — AB (ref 35–70)
LDL Cholesterol: 113 mg/dL
Triglycerides: 74 mg/dL (ref 40–160)

## 2015-05-09 LAB — HEMOGLOBIN A1C: Hemoglobin A1C: 5.8

## 2015-05-09 LAB — HEPATIC FUNCTION PANEL
ALT: 13 U/L (ref 7–35)
AST: 15 U/L (ref 13–35)
Alkaline Phosphatase: 68 U/L (ref 25–125)
Bilirubin, Total: 0.7 mg/dL

## 2015-05-09 LAB — TSH: TSH: 1.13 u[IU]/mL (ref <=5.90)

## 2015-05-10 DIAGNOSIS — Z23 Encounter for immunization: Secondary | ICD-10-CM | POA: Diagnosis not present

## 2015-08-02 ENCOUNTER — Telehealth: Payer: Self-pay | Admitting: Cardiovascular Disease

## 2015-08-02 MED ORDER — METOPROLOL SUCCINATE ER 25 MG PO TB24: 37.5000 mg | ORAL_TABLET | Freq: Every day | ORAL | Status: DC

## 2015-08-02 NOTE — Telephone Encounter (Signed)
New message    *STAT* If patient is at the pharmacy, call can be transferred to refill team.   1. Which medications need to be refilled? (please list name of each medication and dose if known) metoprolol 25mg  2. Which pharmacy/location (including street and city if local pharmacy) is medication to be sent to? [facility]  3. Do they need a 30 day or 90 day supply? 90 was sent but she need 135 a 1 and a half per day

## 2015-08-02 NOTE — Telephone Encounter (Signed)
Rx(s) sent to pharmacy electronically.  

## 2015-08-16 DIAGNOSIS — J00 Acute nasopharyngitis [common cold]: Secondary | ICD-10-CM | POA: Diagnosis not present

## 2015-08-16 DIAGNOSIS — R32 Unspecified urinary incontinence: Secondary | ICD-10-CM | POA: Diagnosis not present

## 2015-08-20 ENCOUNTER — Ambulatory Visit (INDEPENDENT_AMBULATORY_CARE_PROVIDER_SITE_OTHER): Payer: Medicare Other | Admitting: Family Medicine

## 2015-08-20 ENCOUNTER — Encounter: Payer: Self-pay | Admitting: Family Medicine

## 2015-08-20 VITALS — BP 127/74 | HR 84 | Ht 60.75 in | Wt 145.3 lb

## 2015-08-20 DIAGNOSIS — I10 Essential (primary) hypertension: Secondary | ICD-10-CM

## 2015-08-20 DIAGNOSIS — M8580 Other specified disorders of bone density and structure, unspecified site: Secondary | ICD-10-CM | POA: Diagnosis not present

## 2015-08-20 DIAGNOSIS — E785 Hyperlipidemia, unspecified: Secondary | ICD-10-CM

## 2015-08-20 DIAGNOSIS — K219 Gastro-esophageal reflux disease without esophagitis: Secondary | ICD-10-CM | POA: Diagnosis not present

## 2015-08-20 DIAGNOSIS — I471 Supraventricular tachycardia: Secondary | ICD-10-CM | POA: Diagnosis not present

## 2015-08-20 DIAGNOSIS — M810 Age-related osteoporosis without current pathological fracture: Secondary | ICD-10-CM | POA: Insufficient documentation

## 2015-08-20 NOTE — Patient Instructions (Signed)
  Drink half of your weight in ounces of water.    Fatigue Fatigue is feeling tired all of the time, a lack of energy, or a lack of motivation. Occasional or mild fatigue is often a normal response to activity or life in general. However, long-lasting (chronic) or extreme fatigue may indicate an underlying medical condition. HOME CARE INSTRUCTIONS  Watch your fatigue for any changes. The following actions may help to lessen any discomfort you are feeling:  Talk to your health care provider about how much sleep you need each night. Try to get the required amount every night.  Take medicines only as directed by your health care provider.  Eat a healthy and nutritious diet. Ask your health care provider if you need help changing your diet.  Drink enough fluid to keep your urine clear or pale yellow.  Practice ways of relaxing, such as yoga, meditation, massage therapy, or acupuncture.  Exercise regularly.   Change situations that cause you stress. Try to keep your work and personal routine reasonable.  Do not abuse illegal drugs.  Limit alcohol intake to no more than 1 drink per day for nonpregnant women and 2 drinks per day for men. One drink equals 12 ounces of beer, 5 ounces of wine, or 1 ounces of hard liquor.  Take a multivitamin, if directed by your health care provider. SEEK MEDICAL CARE IF:   Your fatigue does not get better.  You have a fever.   You have unintentional weight loss or gain.  You have headaches.   You have difficulty:   Falling asleep.  Sleeping throughout the night.  You feel angry, guilty, anxious, or sad.   You are unable to have a bowel movement (constipation).   You skin is dry.   Your legs or another part of your body is swollen.  SEEK IMMEDIATE MEDICAL CARE IF:   You feel confused.   Your vision is blurry.  You feel faint or pass out.   You have a severe headache.   You have severe abdominal, pelvic, or back pain.    You have chest pain, shortness of breath, or an irregular or fast heartbeat.   You are unable to urinate or you urinate less than normal.   You develop abnormal bleeding, such as bleeding from the rectum, vagina, nose, lungs, or nipples.  You vomit blood.   You have thoughts about harming yourself or committing suicide.   You are worried that you might harm someone else.    This information is not intended to replace advice given to you by your health care provider. Make sure you discuss any questions you have with your health care provider.   Document Released: 11/09/2006 Document Revised: 02/02/2014 Document Reviewed: 05/16/2013 Elsevier Interactive Patient Education [employer].

## 2015-08-20 NOTE — Progress Notes (Signed)
New patient office visit note:  Impression and Recommendations:    1. Essential hypertension   2. PSVT (paroxysmal supraventricular tachycardia) ([place])   3. Osteopenia by Dexa (8-9 yrs ago)    4. Gastroesophageal reflux disease without esophagitis     Blood pressure and pulse well controlled today. Continue current medications. Patient denies she needs refills   Patient more recently had blood work at her PCPs at [facility]. This was about 6 months ago and she will get [place] that blood work.  Asked her to follow up in 4-5 weeks so we can review that and see what additional labs we may need.  At the end of office visit patient states she's been fatigued. I told her we will address this next office visit once we have the lab work which shows what her blood counts are, thyroid levels are, etc    Patient's Medications  New Prescriptions   No medications on file  Previous Medications   ACETAMINOPHEN (TYLENOL) 500 MG TABLET    Take 500 mg by mouth daily as needed (pain).   ALBUTEROL (PROVENTIL HFA;VENTOLIN HFA) 108 (90 BASE) MCG/ACT INHALER    Inhale 2 puffs into the lungs every 4 (four) hours as needed for wheezing or shortness of breath.   ASPIRIN EC 81 MG TABLET    Take 81 mg by mouth daily.   CALCIUM CITRATE-VITAMIN D (CALCIUM CITRATE + D PO)    Take 1 tablet by mouth daily.   CYANOCOBALAMIN 500 MCG TABLET    Take 500 mcg by mouth daily.   IPRATROPIUM (ATROVENT HFA) 17 MCG/ACT INHALER    Inhale 2 puffs into the lungs every 4 (four) hours as needed for wheezing.   METOPROLOL SUCCINATE (TOPROL-XL) 25 MG 24 HR TABLET    Take 1.5 tablets (37.5 mg total) by mouth daily.   MULTIPLE VITAMIN (MULTIVITAMIN WITH MINERALS) TABS TABLET    Take 1 tablet by mouth daily. Centrum Silver   TRIAMCINOLONE CREAM (KENALOG) 0.1 %    Apply 1 application topically daily as needed (itching/eczema).   Modified Medications   No medications on file  Discontinued Medications   ERGOCALCIFEROL (VITAMIN D2) 50000 UNITS CAPSULE    Take 50,000 Units by mouth once a week. Wednesday mornings   OMEPRAZOLE (PRILOSEC) 20 MG CAPSULE    Take 1 capsule (20 mg total) by mouth daily.   SUCRALFATE (CARAFATE) 1 GM/10ML SUSPENSION    Take 10 mLs (1 g total) by mouth 4 (four) times daily -  with meals and at bedtime.    No Follow-up on file.  The patient was counseled, risk factors were discussed, anticipatory guidance given.  Gross side effects, risk and benefits, and alternatives of medications discussed with patient.  Patient is aware that all medications have potential side effects and we are unable to predict every side effect or drug-drug interaction that may occur.  Expresses verbal understanding and consents to current therapy plan and treatment regimen.  Please see AVS handed out to patient at the end of our visit for further patient instructions/ counseling done pertaining to today's office visit.    Note: This document was prepared using Dragon voice recognition software and may include unintentional dictation errors.  ----------------------------------------------------------------------------------------------------------------------    Subjective:    Chief Complaint  Patient presents with  . Establish Care    HPI: Brittany Harvey is a pleasant 70 y.o. female who presents to [place] at [facility] today  to review their medical history with me and establish care.   PCP- old one was at [facility].  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  Pt got a dog- Morkie- "SPORT".  Niece here in [place], duahgters- none live here.  Reads bible, music.   GYN:  Dr. Gertie Fey- gyn exams and Mammo, bone density- q 2 yrs  Cards: Dr Gwenlyn Found- sees him q 12 mo.   Patient had an echocardiogram, transthoracic on 01/02/2014 which was essentially normal only showing mild tricuspid regurgitation, EF was vigorous at 65-70%  GI-  Dr Collene Mares- Had a lot of GI  issues when her husband died in 04-28-12 and hence went to Dr. Collene Mares for evaluation of gastritis and stomach problems. She had an endoscopy which just showed reflux colonscopcy- April 28, 2013- had polyps- 5 yrs  endoscopy- 04-28-13 gerd  optho- [facility] on [address]- no cataracts, glaucoma etc.  Patient Active Problem List   Diagnosis Date Noted  . Osteopenia by Dexa (8-9 yrs ago)  08/20/2015  . Essential hypertension 07/24/2014  . GERD (gastroesophageal reflux disease) 11/27/2013  . PSVT (paroxysmal supraventricular tachycardia) ([place]) 03/27/2013     Past Medical History:  Diagnosis Date  . Anxiety   . Atypical chest pain   . GERD (gastroesophageal reflux disease)   . Heart palpitations   . Hypertension   . Kidney stones   . PSVT (paroxysmal supraventricular tachycardia) ([place])      Past Surgical History:  Procedure Laterality Date  . CARDIOVASCULAR STRESS TEST  06/19/2009   Post stress myocardial perfusion images show a normal pattern of perfusion in all regions. ECG positive for ischemia. Appears to be a "false-positive" ECG stress test.  . TUBAL LIGATION       Family History  Problem Relation Age of Onset  . Adopted: Yes     History  Drug Use No    History  Alcohol Use No    History  Smoking Status  . Former Smoker  . Quit date: 01/27/1992  Smokeless Tobacco  . Never Used    Patient's Medications  New Prescriptions   No medications on file  Previous Medications   ACETAMINOPHEN (TYLENOL) 500 MG TABLET    Take 500 mg by mouth daily as needed (pain).   ALBUTEROL (PROVENTIL HFA;VENTOLIN HFA) 108 (90 BASE) MCG/ACT INHALER    Inhale 2 puffs into the lungs every 4 (four) hours as needed for wheezing or shortness of breath.   ASPIRIN EC 81 MG TABLET    Take 81 mg by mouth daily.   CALCIUM CITRATE-VITAMIN D (CALCIUM CITRATE + D PO)    Take 1 tablet by mouth daily.   CYANOCOBALAMIN 500 MCG TABLET    Take 500 mcg by mouth daily.   IPRATROPIUM (ATROVENT HFA) 17 MCG/ACT  INHALER    Inhale 2 puffs into the lungs every 4 (four) hours as needed for wheezing.   METOPROLOL SUCCINATE (TOPROL-XL) 25 MG 24 HR TABLET    Take 1.5 tablets (37.5 mg total) by mouth daily.   MULTIPLE VITAMIN (MULTIVITAMIN WITH MINERALS) TABS TABLET    Take 1 tablet by mouth daily. Centrum Silver   TRIAMCINOLONE CREAM (KENALOG) 0.1 %    Apply 1 application topically daily as needed (itching/eczema).   Modified Medications   No medications on file  Discontinued Medications   ERGOCALCIFEROL (VITAMIN D2) 50000 UNITS CAPSULE    Take 50,000 Units by mouth once a week. Wednesday mornings   OMEPRAZOLE (PRILOSEC) 20 MG CAPSULE  Take 1 capsule (20 mg total) by mouth daily.   SUCRALFATE (CARAFATE) 1 GM/10ML SUSPENSION    Take 10 mLs (1 g total) by mouth 4 (four) times daily -  with meals and at bedtime.    Allergies: Penicillins; Tussin dm [guaifenesin-dm]; and Zithromax [azithromycin]  Review of Systems:   ( Completed via Adult Medical History Intake form today ) General:  Denies fever, chills, appetite changes, unexplained weight loss.  Optho/Auditory:   Denies visual changes, blurred vision/LOV, ringing in ears/ diff hearing Respiratory:   Denies SOB, DOE, cough, wheezing.  Cardiovascular:   Denies chest pain, palpitations, new onset peripheral edema  Gastrointestinal:   Denies nausea, vomiting, diarrhea.  Genitourinary:    Denies dysuria, increased frequency, flank pain.  Endocrine:     Denies hot or cold intolerance, polyuria, polydipsia. Musculoskeletal:  Denies unexplained myalgias, joint swelling, arthralgias, gait problems.  Skin:  Denies rash, suspicious lesions or new/ changes in moles Neurological:    Denies dizziness, syncope, unexplained weakness, lightheadedness, numbness  Psychiatric/Behavioral:   Denies mood changes, suicidal or homicidal ideations, hallucinations    Objective:   Blood pressure 127/74, pulse 84, height 5' 0.75" (1.543 m), weight 145 lb 4.8 oz (65.9 kg).  Body mass index is 27.68 kg/m.  General: Well Developed, well nourished, and in no acute distress.  Neuro: Alert and oriented x3, extra-ocular muscles intact, sensation grossly intact.  HEENT: Normocephalic, atraumatic, pupils equal round reactive to light, neck supple, no gross masses, no carotid bruits, no JVD apprec Skin: no gross suspicious lesions or rashes  Cardiac: Regular rate and rhythm, no murmurs rubs or gallops.  Respiratory: Essentially clear to auscultation bilaterally. Not using accessory muscles, speaking in full sentences.  Abdominal: Soft, not grossly distended Musculoskeletal: Ambulates w/o diff, FROM * 4 ext.  Vasc: less 2 sec cap RF, warm and pink  Psych:  No HI/SI, judgement and insight good.

## 2015-08-28 ENCOUNTER — Ambulatory Visit: Payer: Medicare Other | Admitting: Family Medicine

## 2015-08-30 ENCOUNTER — Ambulatory Visit (INDEPENDENT_AMBULATORY_CARE_PROVIDER_SITE_OTHER): Payer: Medicare Other | Admitting: Cardiovascular Disease

## 2015-08-30 ENCOUNTER — Encounter: Payer: Self-pay | Admitting: Cardiovascular Disease

## 2015-08-30 VITALS — BP 122/70 | HR 73 | Ht 63.0 in | Wt 148.6 lb

## 2015-08-30 DIAGNOSIS — I471 Supraventricular tachycardia: Secondary | ICD-10-CM

## 2015-08-30 DIAGNOSIS — I10 Essential (primary) hypertension: Secondary | ICD-10-CM

## 2015-08-30 DIAGNOSIS — E785 Hyperlipidemia, unspecified: Secondary | ICD-10-CM

## 2015-08-30 NOTE — Patient Instructions (Signed)

## 2015-08-30 NOTE — Assessment & Plan Note (Signed)
History of hypertension blood pressure measured 122/70. She is on metoprolol. Continue current meds at current dosing

## 2015-08-30 NOTE — Assessment & Plan Note (Signed)
History of hyperlipidemia now diet controlled, followed by her PCP

## 2015-08-30 NOTE — Assessment & Plan Note (Signed)
History of PSVT status post adenosine conversion 11/19/13. She's had no subsequent episodes.

## 2015-08-30 NOTE — Progress Notes (Signed)
08/30/2015 Brittany Harvey   Dec 02, 1945  [id number]  Primary Physician Mellody Dance, DO Primary Cardiologist: Lorretta Harp MD Lupe Car[place], [place]  HPI:   Brittany Harvey is a 70 year old moderately overweight widowed Caucasian female who I last saw 07/24/14. She saw Dr. Sallyanne Kuster back in 2011.[name] She has a history of PSVT remotely back in 2004. She had a false positive GXT back in 2011 which led to a Myoview which was normal. She was admitted to the hospital back in March after I saw her in the office for chest pain/rule out microinfarction. A stress MR I was completely normal. She said since and no recurrent symptoms. The presumed etiology of his of her chest pain was reflux. She saw Brittany Harvey in the office 11/27/13 after she had been seen at [name] emergency room for PSVT requiring adenosine conversion. She's had no recurrent symptoms.   Current Outpatient Prescriptions  Medication Sig Dispense Refill  . acetaminophen (TYLENOL) 500 MG tablet Take 500 mg by mouth daily as needed (pain).    [name] aspirin EC 81 MG tablet Take 81 mg by mouth daily.    . Calcium Citrate-Vitamin D (CALCIUM CITRATE + D PO) Take 1 tablet by mouth daily.    . Cholecalciferol (VITAMIN D3) 1000 units CAPS Take 1 capsule by mouth daily.    . Cranberry 1000 MG CAPS Take 1 capsule by mouth daily.    . cyanocobalamin 500 MCG tablet Take 500 mcg by mouth daily.    [name] ipratropium (ATROVENT HFA) 17 MCG/ACT inhaler Inhale 2 puffs into the lungs every 4 (four) hours as needed for wheezing.    . metoprolol succinate (TOPROL-XL) 25 MG 24 hr tablet Take 1.5 tablets (37.5 mg total) by mouth daily. 135 tablet 0  . Multiple Vitamin (MULTIVITAMIN WITH MINERALS) TABS tablet Take 1 tablet by mouth daily. Centrum Silver    . Omega-3 Fatty Acids (FISH OIL) 1000 MG CAPS Take 1 capsule by mouth daily.    [name] triamcinolone cream (KENALOG) 0.1 % Apply 1 application topically daily as needed (itching/eczema).   3   No current  facility-administered medications for this visit.     Allergies  Allergen Reactions  . Penicillins Anaphylaxis and Swelling  . Tussin Dm [Guaifenesin-Dm] Other (See Comments)    Dizziness/ Lightheadness  . Zithromax [Azithromycin] Other (See Comments)    Pre-syncope    Social History   Social History  . Marital status: Married    Spouse name: [name]  . Number of children: [name]  . Years of education: [name]   Occupational History  . Not on file.   Social History Main Topics  . Smoking status: Former Smoker    Quit date: 01/27/1992  . Smokeless tobacco: Never Used  . Alcohol use No  . Drug use: No  . Sexual activity: No   Other Topics Concern  . Not on file   Social History Narrative  . No narrative on file     Review of Systems: General: negative for chills, fever, night sweats or weight changes.  Cardiovascular: negative for chest pain, dyspnea on exertion, edema, orthopnea, palpitations, paroxysmal nocturnal dyspnea or shortness of breath Dermatological: negative for rash Respiratory: negative for cough or wheezing Urologic: negative for hematuria Abdominal: negative for nausea, vomiting, diarrhea, bright red blood per rectum, melena, or hematemesis Neurologic: negative for visual changes, syncope, or dizziness All other systems reviewed and are otherwise negative except as noted above.    Blood pressure  122/70, pulse 73, height 5\' 3"  (1.6 m), weight 148 lb 9.6 oz (67.4 kg).  General appearance: alert and no distress Neck: no adenopathy, no carotid bruit, no JVD, supple, symmetrical, trachea midline and thyroid not enlarged, symmetric, no tenderness/mass/nodules Lungs: clear to auscultation bilaterally Heart: regular rate and rhythm, S1, S2 normal, no murmur, click, rub or gallop Extremities: extremities normal, atraumatic, no cyanosis or edema  EKG sinus rhythm at 73 without ST or T-wave changes. I personally reviewed this EKG  ASSESSMENT AND PLAN:   PSVT  (paroxysmal supraventricular tachycardia) ([place]) History of PSVT status post adenosine conversion 11/19/13. She's had no subsequent episodes.  Essential hypertension History of hypertension blood pressure measured 122/70. She is on metoprolol. Continue current meds at current dosing  h/o HLD (hyperlipidemia) History of hyperlipidemia now diet controlled, followed by her PCP      Lorretta Harp MD [facility], [facility] 08/30/2015 11:08 AM

## 2015-09-06 LAB — VITAMIN D, 1,25 + 25-HYDROXY: Vit D, 25-Hydroxy: 20.8

## 2015-09-06 LAB — VITAMIN D 25 HYDROXY (VIT D DEFICIENCY, FRACTURES): Vit D, 25-Hydroxy: 32.5

## 2015-09-18 ENCOUNTER — Encounter: Payer: Self-pay | Admitting: Family Medicine

## 2015-09-24 ENCOUNTER — Ambulatory Visit (INDEPENDENT_AMBULATORY_CARE_PROVIDER_SITE_OTHER): Payer: Medicare Other | Admitting: Family Medicine

## 2015-09-24 ENCOUNTER — Encounter: Payer: Self-pay | Admitting: Family Medicine

## 2015-09-24 VITALS — BP 128/77 | HR 65 | Wt 148.9 lb

## 2015-09-24 DIAGNOSIS — E663 Overweight: Secondary | ICD-10-CM | POA: Insufficient documentation

## 2015-09-24 DIAGNOSIS — R0989 Other specified symptoms and signs involving the circulatory and respiratory systems: Secondary | ICD-10-CM

## 2015-09-24 DIAGNOSIS — M8580 Other specified disorders of bone density and structure, unspecified site: Secondary | ICD-10-CM

## 2015-09-24 DIAGNOSIS — R7303 Prediabetes: Secondary | ICD-10-CM

## 2015-09-24 DIAGNOSIS — E785 Hyperlipidemia, unspecified: Secondary | ICD-10-CM

## 2015-09-24 DIAGNOSIS — R5382 Chronic fatigue, unspecified: Secondary | ICD-10-CM | POA: Insufficient documentation

## 2015-09-24 DIAGNOSIS — I10 Essential (primary) hypertension: Secondary | ICD-10-CM

## 2015-09-24 DIAGNOSIS — I471 Supraventricular tachycardia: Secondary | ICD-10-CM

## 2015-09-24 DIAGNOSIS — E7889 Other lipoprotein metabolism disorders: Secondary | ICD-10-CM

## 2015-09-24 NOTE — Patient Instructions (Addendum)
Take some sustained release melatonin- 5-10 mg 76min before bed.    -  AHA guidelines for exercise of 150 minutes of moderate intensity aerobic activity per week discussed and encouraged. 15 min twice daily of speed walking, 5 d or more per week.   Discussed how regular exercise will improve brain function and memory, as well as improve mood, boost immune system and help with weight management, among the other, more well-known effects of exercise such as decreasing risk for hypertension, diabetes, hyperlipidemia etc.   - Sleep hygiene discussed. Counseled on negative effects of "blue light" from items such as computer screens, smart phones, TVs etc.    -  The AHA strongly endorses consumption of a diet that contains a variety of foods from all the food categories with an emphasis on fruits and vegetables; fat-free and low-fat dairy products; cereal and grain products; legumes and nuts; and fish, poultry, and or lean meats.   Excessive food intake, especially of foods high in saturated and trans fats, sugar, and salt, should be avoided    Prediabetes Eating Plan Prediabetes--also called impaired glucose tolerance or impaired fasting glucose--is a condition that causes blood sugar (blood glucose) levels to be higher than normal. Following a healthy diet can help to keep prediabetes under control. It can also help to lower the risk of type 2 diabetes and heart disease, which are increased in people who have prediabetes. Along with regular exercise, a healthy diet:  Promotes weight loss.  Helps to control blood sugar levels.  Helps to improve the way that the body uses insulin. WHAT DO I NEED TO KNOW ABOUT THIS EATING PLAN?  Use the glycemic index (GI) to plan your meals. The index tells you how quickly a food will raise your blood sugar. Choose low-GI foods. These foods take a longer time to raise blood sugar.  Pay close attention to the amount of carbohydrates in the food that you eat.  Carbohydrates increase blood sugar levels.  Keep track of how many calories you take in. Eating the right amount of calories will help you to achieve a healthy weight. Losing about 7 percent of your starting weight can help to prevent type 2 diabetes.  You may want to follow a Mediterranean diet. This diet includes a lot of vegetables, lean meats or fish, whole grains, fruits, and healthy oils and fats. WHAT FOODS CAN I EAT? Grains Whole grains, such as whole-wheat or whole-grain breads, crackers, cereals, and pasta. Unsweetened oatmeal. Bulgur. Barley. Quinoa. Brown rice. Corn or whole-wheat flour tortillas or taco shells. Vegetables Lettuce. Spinach. Peas. Beets. Cauliflower. Cabbage. Broccoli. Carrots. Tomatoes. Squash. Eggplant. Herbs. Peppers. Onions. Cucumbers. Brussels sprouts. Fruits Berries. Bananas. Apples. Oranges. Grapes. Papaya. Mango. Pomegranate. Kiwi. Grapefruit. Cherries. Meats and Other Protein Sources Seafood. Lean meats, such as chicken and [place] or lean cuts of pork and beef. Tofu. Eggs. Nuts. Beans. Dairy Low-fat or fat-free dairy products, such as yogurt, cottage cheese, and cheese. Beverages Water. Tea. Coffee. Sugar-free or diet soda. Seltzer water. Milk. Milk alternatives, such as soy or almond milk. Condiments Mustard. Relish. Low-fat, low-sugar ketchup. Low-fat, low-sugar barbecue sauce. Low-fat or fat-free mayonnaise. Sweets and Desserts Sugar-free or low-fat pudding. Sugar-free or low-fat ice cream and other frozen treats. Fats and Oils Avocado. Walnuts. Olive oil. The items listed above may not be a complete list of recommended foods or beverages. Contact your dietitian for more options.  WHAT FOODS ARE NOT RECOMMENDED? Grains Refined white flour and flour products, such as bread, pasta,  snack foods, and cereals. Beverages Sweetened drinks, such as sweet iced tea and soda. Sweets and Desserts Baked goods, such as cake, cupcakes, pastries, cookies, and  cheesecake. The items listed above may not be a complete list of foods and beverages to avoid. Contact your dietitian for more information.   This information is not intended to replace advice given to you by your health care provider. Make sure you discuss any questions you have with your health care provider.   Document Released: 05/29/2014 Document Reviewed: 05/29/2014 Elsevier Interactive Patient Education 2016 [place] factors for prediabetes and type 2 diabetes  Researchers don't fully understand why some people develop prediabetes and type 2 diabetes and others don't.  It's clear that certain factors increase the risk, however, including:  Weight. The more fatty tissue you have, the more resistant your cells become to insulin.  Inactivity. The less active you are, the greater your risk. Physical activity helps you control your weight, uses up glucose as energy and makes your cells more sensitive to insulin.  Family history. Your risk increases if a parent or sibling has type 2 diabetes.  Race. Although it's unclear why, people of certain races - including blacks, Hispanics, American Indians and Asian-Americans - are at higher risk.  Age. Your risk increases as you get older. This may be because you tend to exercise less, lose muscle mass and gain weight as you age. But type 2 diabetes is also increasing dramatically among children, adolescents and younger adults.  Gestational diabetes. If you developed gestational diabetes when you were pregnant, your risk of developing prediabetes and type 2 diabetes later increases. If you gave birth to a baby weighing more than 9 pounds (4 kilograms), you're also at risk of type 2 diabetes.  Polycystic ovary syndrome. For women, having polycystic ovary syndrome - a common condition characterized by irregular menstrual periods, excess hair growth and obesity - increases the risk of diabetes.  High blood pressure. Having blood  pressure over 140/90 millimeters of mercury (mm Hg) is linked to an increased risk of type 2 diabetes.  Abnormal cholesterol and triglyceride levels. If you have low levels of high-density lipoprotein (HDL), or "good," cholesterol, your risk of type 2 diabetes is higher. Triglycerides are another type of fat carried in the blood. People with high levels of triglycerides have an increased risk of type 2 diabetes. Your doctor can let you know what your cholesterol and triglyceride levels are.    A good guide to good carbs: The glycemic index ---If you have diabetes, or at risk for diabetes, you know all too well that when you eat carbohydrates, your blood sugar goes up. The total amount of carbs you consume at a meal or in a snack mostly determines what your blood sugar will do. But the food itself also plays a role. A serving of white rice has almost the same effect as eating pure table sugar - a quick, high spike in blood sugar. A serving of lentils has a slower, smaller effect.  ---Picking good sources of carbs can help you control your blood sugar and your weight. Even if you don't have diabetes, eating healthier carbohydrate-rich foods can help ward off a host of chronic conditions, from heart disease to various cancers to, well, diabetes.  ---One way to choose foods is with the glycemic index (GI). This tool measures how much a food boosts blood sugar.  The glycemic index rates the effect of  a specific amount of a food on blood sugar compared with the same amount of pure glucose. A food with a glycemic index of 28 boosts blood sugar only 28% as much as pure glucose. One with a GI of 95 acts like pure glucose.  High glycemic foods result in a quick spike in insulin and blood sugar (also known as blood glucose). Low glycemic foods have a slower, smaller effect.   Using the glycemic index Using the glycemic index is easy: choose foods in the low GI category instead of those in the high GI category  (see below), and go easy on those in between. Low glycemic index (GI of 55 or less): Most fruits and vegetables, beans, minimally processed grains, pasta, low-fat dairy foods, and nuts.  Moderate glycemic index (GI 56 to 69): White and sweet potatoes, corn, white rice, couscous, breakfast cereals such as Cream of Wheat and Mini Wheats.  High glycemic index (GI of 70 or higher): White bread, rice cakes, most crackers, bagels, cakes, doughnuts, croissants, most packaged breakfast cereals. You can see the values for 100 commons foods and get links to more at www.health.CheapToothpicks.si.  Swaps for lowering glycemic index  Instead of this high-glycemic index food Eat this lower-glycemic index food  White rice Brown rice or converted rice  Instant oatmeal Steel-cut oats  Cornflakes Bran flakes  Baked potato Pasta, bulgur  White bread Whole-grain bread  Corn Peas or leafy greens

## 2015-09-24 NOTE — Assessment & Plan Note (Addendum)
Explained to patient what BMI refers to, and what it means medically.    Told patient to think about it as a "medical risk stratification measurement" and how increasing BMI is associated with increasing risk/ or worsening state of various diseases such as hypertension, hyperlipidemia, diabetes, premature OA, depression etc.  -  AHA guidelines for exercise of 150 minutes of moderate intensity aerobic activity per week discussed and encouraged.   Discussed how regular exercise will improve brain function and memory, as well as improve mood, boost immune system and help with weight management, among the other, more well-known effects of exercise such as decreasing risk for hypertension, diabetes, hyperlipidemia etc.  - Sleep hygiene discussed. Counseled on negative effects of "blue light" from items such as computer screens, smart phones, TVs etc.   -  The AHA strongly endorses consumption of a diet that contains a variety of foods from all the food categories with an emphasis on fruits and vegetables; fat-free and low-fat dairy products; cereal and grain products; legumes and nuts; and fish, poultry, and or lean meats.   Excessive food intake, especially of foods high in saturated and trans fats, sugar, and salt, should be avoided  Health counseling performed.  All questions answered.

## 2015-09-24 NOTE — Assessment & Plan Note (Addendum)
No symptoms SVT since adenosine conversion in 2015.   Patient not sure if her fatigue increased along the same time that she started the beta blocker but I explained this can cause fatigue. She will discuss with Dr. Gwenlyn Found at next office visit to see what he thinks.  Patient would not make any changes until she discusses with her cardiologist

## 2015-09-24 NOTE — Assessment & Plan Note (Addendum)
Discussion with patient regarding fatigue and the possibility of many reasons that could be contributing to her symptoms including medications, poor diet- carb rich meals, stress levels, poor sleep habits\ possible undiagnosed OSA, depression etc.  - Patient declines sleep study - She will look to change her diet especially since diagnosed with prediabetes. Increase protein, decreased carbohydrates and sweets. - Try to "exercise "every day to a goal of 30 minutes. - Encouraged to join [employer] or other activities for socialization with her peers.   She denies depression but I suspect there may be some contributing to her symptoms

## 2015-09-24 NOTE — Assessment & Plan Note (Signed)
Well controlled 

## 2015-09-24 NOTE — Progress Notes (Signed)
Impression and Recommendations:    1. Prediabetes   2. Overweight (BMI 25.0-29.9)   3. Elevated HDL = 90   4. h/o HLD (hyperlipidemia)   5. Osteopenia by Dexa (8-9 yrs ago)    6. Essential hypertension   7. PSVT (paroxysmal supraventricular tachycardia) ([place])   8. Fatigue    PSVT (paroxysmal supraventricular tachycardia) ([facility]) No symptoms SVT since adenosine conversion in 2015.   Patient not sure if her fatigue increased along the same time that she started the beta blocker but I explained this can cause fatigue. She will discuss with Dr. Gwenlyn Found at next office visit to see what he thinks.  Patient would not make any changes until she discusses with her cardiologist  Prediabetes This is a "new" diagnosis for patient. Extensive counseling done, handouts provided. All questions were answered.  Overweight (BMI 25.0-29.9) Explained to patient what BMI refers to, and what it means medically.    Told patient to think about it as a "medical risk stratification measurement" and how increasing BMI is associated with increasing risk/ or worsening state of various diseases such as hypertension, hyperlipidemia, diabetes, premature OA, depression etc.  -  AHA guidelines for exercise of 150 minutes of moderate intensity aerobic activity per week discussed and encouraged.   Discussed how regular exercise will improve brain function and memory, as well as improve mood, boost immune system and help with weight management, among the other, more well-known effects of exercise such as decreasing risk for hypertension, diabetes, hyperlipidemia etc.  - Sleep hygiene discussed. Counseled on negative effects of "blue light" from items such as computer screens, smart phones, TVs etc.   -  The AHA strongly endorses consumption of a diet that contains a variety of foods from all the food categories with an emphasis on fruits and vegetables; fat-free and low-fat dairy products; cereal and grain products; legumes  and nuts; and fish, poultry, and or lean meats.   Excessive food intake, especially of foods high in saturated and trans fats, sugar, and salt, should be avoided  Health counseling performed.  All questions answered.  Elevated HDL = 90 Explained significance of this, told to keep exercising and moving.  Patient was confused as to meaning of this and is now happy to hear it's a good thing.  h/o HLD (hyperlipidemia) Very well controlled. Continue dietary and lifestyle modifications  Essential hypertension Well-controlled.    Fatigue Discussion with patient regarding fatigue and the possibility of many reasons that could be contributing to her symptoms including medications, poor diet- carb rich meals, stress levels, poor sleep habits\ possible undiagnosed OSA, depression etc.  - Patient declines sleep study - She will look to change her diet especially since diagnosed with prediabetes. Increase protein, decreased carbohydrates and sweets. - Try to "exercise "every day to a goal of 30 minutes. - Encouraged to join [employer] or other activities for socialization with her peers.   She denies depression but I suspect there may be some contributing to her symptoms   Patient's Medications  New Prescriptions   No medications on file  Previous Medications   ACETAMINOPHEN (TYLENOL) 500 MG TABLET    Take 500 mg by mouth daily as needed (pain).   ASPIRIN EC 81 MG TABLET    Take 81 mg by mouth daily.   CALCIUM CITRATE-VITAMIN D (CALCIUM CITRATE + D PO)    Take 1 tablet by mouth daily.   CHOLECALCIFEROL (VITAMIN D3) 1000 UNITS CAPS  Take 5 capsules by mouth daily. Told pt to increase to 5,000 IU per day   CRANBERRY 1000 MG CAPS    Take 1 capsule by mouth daily.   CYANOCOBALAMIN 500 MCG TABLET    Take 500 mcg by mouth daily.   IPRATROPIUM (ATROVENT HFA) 17 MCG/ACT INHALER    Inhale 2 puffs into the lungs every 4 (four) hours as needed for wheezing.   METOPROLOL SUCCINATE (TOPROL-XL) 25 MG 24 HR  TABLET    Take 1.5 tablets (37.5 mg total) by mouth daily.   MULTIPLE VITAMIN (MULTIVITAMIN WITH MINERALS) TABS TABLET    Take 1 tablet by mouth daily. Centrum Silver   OMEGA-3 FATTY ACIDS (FISH OIL) 1000 MG CAPS    Take 1 capsule by mouth daily.   TRIAMCINOLONE CREAM (KENALOG) 0.1 %    Apply 1 application topically daily as needed (itching/eczema).   Modified Medications   No medications on file  Discontinued Medications   No medications on file    Return in about 2 months (around 11/24/2015) for reck energy levels, eating habits and reck A1c/ wt/ diet.  The patient was counseled, risk factors were discussed, anticipatory guidance given.  Gross side effects, risk and benefits, and alternatives of medications discussed with patient.  Patient is aware that all medications have potential side effects and we are unable to predict every side effect or drug-drug interaction that may occur.  Expresses verbal understanding and consents to current therapy plan and treatment regimen.  Please see AVS handed out to patient at the end of our visit for further patient instructions/ counseling done pertaining to today's office visit.    Note: This document was prepared using Dragon voice recognition software and may include unintentional dictation errors.   --------------------------------------------------------------------------------------------------------------------------------------------------------------------------------------------------------------------------------------------    Subjective:    CC:  Chief Complaint  Patient presents with  . Results  . Fatigue    HPI: Brittany Harvey is a 70 y.o. female who presents to [place] at [facility] today for issues as discussed below.   Here to discuss her recent labs performed by another provider and also to discuss her fatigue which is a little better now than last OV.       Fatigue:  Works [employer].  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   Seems like I am not sleeping good.   Im constantly fluffing my pillows- she thinks she needs a new pillow., " I don't feel rested in the mornings."     Patient does have a poor diet.  No exercise. Not sure if she snores/gasps for air while sleeping as she lives alone.     Labs: show elevated HDL to 90- all the rest of cholesterol levels normal, A1c of 5.8 and one year ago vitamin D was 20.8 now just over 32.   We had a discussion as to the meaning of all of these things. Patient had no idea that she was glucose intolerant and tells me she eats very poorly. A lot of bread, and sweets.  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    Wt Readings from Last 3 Encounters:  09/24/15 148 lb 14.4 oz (67.5 kg)  08/30/15 148 lb 9.6 oz (67.4 kg)  08/20/15 145 lb 4.8 oz (65.9 kg)   BP Readings from Last 3 Encounters:  09/24/15 128/77  08/30/15 122/70  08/20/15 127/74   Pulse Readings from Last 3 Encounters:  09/24/15 65  08/30/15 73  08/20/15 84     Patient Active Problem List   Diagnosis Date Noted  . Prediabetes 09/24/2015  . Overweight (BMI 25.0-29.9) 09/24/2015  . Elevated HDL = 90 09/24/2015  . Fatigue 09/24/2015  . Osteopenia by Dexa (8-9 yrs ago)  08/20/2015  . h/o HLD (hyperlipidemia) 08/20/2015  . Essential hypertension 07/24/2014  . GERD (gastroesophageal reflux disease) 11/27/2013  . PSVT (paroxysmal supraventricular tachycardia) ([place]) 03/27/2013    Past Medical history, Surgical history, Family history, Social history, Allergies and Medications have been entered into the medical record, reviewed and changed as needed.   Allergies:  Allergies  Allergen Reactions  . Penicillins Anaphylaxis and Swelling  . Tussin Dm [Guaifenesin-Dm] Other (See Comments)    Dizziness/ Lightheadness  . Zithromax [Azithromycin] Other (See Comments)     Pre-syncope    Review of Systems: No fever/ chills, night sweats, no unintended weight loss, No chest pain, or increased shortness of breath. No N/V/D.  Pertinent positives and negatives noted in HPI above    Objective:   Blood pressure 128/77, pulse 65, weight 148 lb 14.4 oz (67.5 kg). Body mass index is 26.38 kg/m. General: Well Developed, well nourished, appropriate for stated age.  Neuro: Alert and oriented x3, extra-ocular muscles intact, sensation grossly intact.  HEENT: Normocephalic, atraumatic, neck supple, no JVD no carotid bruits   Skin: Warm and dry, no gross rash. Cardiac: RRR, S1 S2,  no murmurs rubs or gallops.  Respiratory: ECTA B/L, Not using accessory muscles, speaking in full sentences-unlabored. Vascular:  No gross lower ext edema, cap RF less 2 se Psno edemacych: No HI/SI, judgement and insight good, Euthymic mood. Full Affect.

## 2015-09-24 NOTE — Assessment & Plan Note (Signed)
Explained significance of this, told to keep exercising and moving.  Patient was confused as to meaning of this and is now happy to hear it's a good thing.

## 2015-09-24 NOTE — Assessment & Plan Note (Signed)
Very well controlled. Continue dietary and lifestyle modifications

## 2015-09-24 NOTE — Assessment & Plan Note (Addendum)
This is a "new" diagnosis for patient. Extensive counseling done, handouts provided. All questions were answered.

## 2015-10-21 ENCOUNTER — Encounter: Payer: Self-pay | Admitting: Family Medicine

## 2015-10-23 DIAGNOSIS — M25512 Pain in left shoulder: Secondary | ICD-10-CM | POA: Diagnosis not present

## 2015-10-23 DIAGNOSIS — M25511 Pain in right shoulder: Secondary | ICD-10-CM | POA: Diagnosis not present

## 2015-10-31 ENCOUNTER — Ambulatory Visit (INDEPENDENT_AMBULATORY_CARE_PROVIDER_SITE_OTHER): Payer: Medicare Other

## 2015-10-31 VITALS — BP 167/78 | HR 67 | Temp 98.1°F

## 2015-10-31 DIAGNOSIS — Z23 Encounter for immunization: Secondary | ICD-10-CM

## 2015-12-04 ENCOUNTER — Telehealth: Payer: Self-pay | Admitting: Family Medicine

## 2015-12-04 NOTE — Telephone Encounter (Signed)
Patient left a VM during lunch about needing to speak with you or Dr. Jenetta Downer about an unspecified medical concern

## 2015-12-05 ENCOUNTER — Telehealth: Payer: Self-pay | Admitting: Family Medicine

## 2015-12-05 NOTE — Telephone Encounter (Signed)
patient got your missed call and apologized for not being able to answer/playing telephone tag. She said if you could call her back this afternoon on her home phone that would be amazing

## 2015-12-06 ENCOUNTER — Telehealth: Payer: Self-pay | Admitting: Family Medicine

## 2015-12-06 NOTE — Telephone Encounter (Signed)
Patient came in Friday and apologized about playing phone tag, she still would like to talk to you a quick minute and said that calling her at her home number after 3:30p would be the best time and that thanks again for calling her back several times.

## 2015-12-10 NOTE — Telephone Encounter (Signed)
Pt states that she hit the top of her head on a cabinet 2 weeks ago.  Shortly after, she developed a temporal headache, but denies nausea/vomiting, visual changes, LOC.  Pt states that she has not had any headaches since that time.  Advised pt that given the length of time that has passed since the injury and symptoms, she should be fine and that if she develops any symptoms to either call the office for OV or proceed to nearest UC or ER.  Pt expressed understanding and is agreeable.  Charyl Bigger, CMA

## 2015-12-17 ENCOUNTER — Ambulatory Visit: Payer: Medicare Other | Admitting: Family Medicine

## 2015-12-24 ENCOUNTER — Ambulatory Visit: Payer: Medicare Other | Admitting: Family Medicine

## 2015-12-26 ENCOUNTER — Ambulatory Visit: Payer: Medicare Other | Admitting: Family Medicine

## 2016-02-17 ENCOUNTER — Telehealth: Payer: Self-pay | Admitting: Family Medicine

## 2016-02-17 ENCOUNTER — Ambulatory Visit (INDEPENDENT_AMBULATORY_CARE_PROVIDER_SITE_OTHER): Payer: Medicare Other | Admitting: Family Medicine

## 2016-02-17 ENCOUNTER — Encounter: Payer: Self-pay | Admitting: Family Medicine

## 2016-02-17 VITALS — BP 133/79 | HR 82 | Temp 98.1°F | Ht 63.0 in | Wt 148.7 lb

## 2016-02-17 DIAGNOSIS — R062 Wheezing: Secondary | ICD-10-CM

## 2016-02-17 DIAGNOSIS — R509 Fever, unspecified: Secondary | ICD-10-CM

## 2016-02-17 DIAGNOSIS — R05 Cough: Secondary | ICD-10-CM | POA: Diagnosis not present

## 2016-02-17 DIAGNOSIS — J208 Acute bronchitis due to other specified organisms: Secondary | ICD-10-CM

## 2016-02-17 DIAGNOSIS — R059 Cough, unspecified: Secondary | ICD-10-CM

## 2016-02-17 DIAGNOSIS — M791 Myalgia, unspecified site: Secondary | ICD-10-CM

## 2016-02-17 LAB — POCT INFLUENZA A/B
Influenza A, POC: NEGATIVE
Influenza B, POC: NEGATIVE

## 2016-02-17 MED ORDER — PREDNISONE 20 MG PO TABS: ORAL_TABLET | ORAL | 0 refills | Status: DC

## 2016-02-17 NOTE — Patient Instructions (Addendum)
Told patient she must be fever free and off all antipyretic medicines for 24 hours before she can return to work.  I also called in albuterol for you to use just when necessary.-   You most likely have a viral infection that should resolve on its own over time.  Symptoms for a viral upper respiratory tract infection usually last 3-7 days but can stretch out to 2-3 weeks before you're feeling back to normal.  Your symptoms should not worsen after 7-10 days and if they do, please notify our office, as you may need antibiotics.  You can use over-the-counter afrin nasal spray for up to 3 days (NO longer than that) which will help acutely with nasal drainage/ congestion short term.   Also, sterile saline nasal rinses, such as Milta Deiters med or AYR sinus rinses, can be very helpful and should be done twice daily- even throughout the allergy season.  Remember you should use distilled water or previously boiled water to do this.   You can also use an over the counter cold and flu medication such as Tylenol Severe Cold and Sinus/Flu or Dayquil, Nyquil and the like, which will help with cough, congestion, headache/ pain, fevers/chills etc.  Please note, if you being treated for hypertension or have high blood pressure, you should be using the ones designated "HBP".    Unfortunately, antibiotics are not helpful for viral infections.  Wash your hands frequently, as you did not want to get those around you sick as well. Never sneeze or cough on others.  And you should not be going to school or work if you are running a temperature of 100.5 or more on two separate occasions.   Drink plenty of fluids and stay hydrated, especially if you are running fevers.  We don't know why, but chicken soup also helps, try it! :)

## 2016-02-17 NOTE — Telephone Encounter (Signed)
Done.  T. Ryon Layton, CMA °

## 2016-02-17 NOTE — Telephone Encounter (Signed)
Pt called states [facility] says  no Prednisone Rx for her..question if Dr. Jenetta Downer cld to mail order pharmacy in error. Pt want Rx cld in to [facility] instead.  Thanks Baker Janus

## 2016-02-17 NOTE — Progress Notes (Signed)
Acute Care Office visit  Assessment and plan:  1. Acute viral bronchitis   2. Fever, unspecified fever cause   3. Cough   4. Myalgia   5. Wheezing      Anticipatory guidance and routine counseling done re: condition, txmnt options and need for follow up. All questions of patient's were answered.  - Viral vs Allergic vs Bacterial causes for pt's symptoms reveiwed.    - Supportive care and various OTC medications discussed in addition to any prescribed. - Call or RTC if new symptoms, or if no improvement or worse over next couple days.   - Will consider ABX at that time if sx continue past 7-10 days and worsening.    Orders Placed This Encounter  Procedures  . POCT Influenza A/B     New Prescriptions   PREDNISONE (DELTASONE) 20 MG TABLET    Take 3 pills a day for 2 days, 2 pills a day for 2 days, 1 pill a day for 2 days then one half pill a day for 2 days then off    Modified Medications   No medications on file    Discontinued Medications   No medications on file     Gross side effects, risk and benefits, and alternatives of medications discussed with patient.  Patient is aware that all medications have potential side effects and we are unable to predict every sideeffect or drug-drug interaction that may occur.  Expresses verbal understanding and consents to current therapy plan and treatment regiment.  Return if symptoms worsen or fail to improve, for  otherwise follow-up for chronic care as previously discussed.  Please see AVS handed out to patient at the end of our visit for additional patient instructions/ counseling done pertaining to today's office visit.  Note: This document was prepared using Dragon voice recognition software and may include unintentional dictation errors.    Subjective:    Chief Complaint  Patient presents with  . Cough  . Fever    HPI:  Pt presents with URI sx for 4 days after cleaing off car of snow.  101.9-->  orally  Took-  tylenol.     C/o Dry cough. Sounded like water rattling in her chest with breathing yesterday, felt a little SOB with it - this am- clear,HA.  No RN, ST.    Missed work since Wed   Denies objective C, No face pain or ear pain, No N/V/D, No wh/ DIB, No Rash.     Cloricidn HBP taken anything for sx.  Also.   Overall getting better.   --> exposed to very minimal mount of second hand smoke  Flu - neg   Patient Active Problem List   Diagnosis Date Noted  . Prediabetes 09/24/2015  . Overweight (BMI 25.0-29.9) 09/24/2015  . Elevated HDL = 90 09/24/2015  . Fatigue 09/24/2015  . Osteopenia by Dexa (8-9 yrs ago)  08/20/2015  . h/o HLD (hyperlipidemia) 08/20/2015  . Essential hypertension 07/24/2014  . GERD (gastroesophageal reflux disease) 11/27/2013  . PSVT (paroxysmal supraventricular tachycardia) ([place]) 03/27/2013    Past medical history, Surgical history, Family history reviewed and noted below, Social history, Allergies, and Medications have been entered into the medical record, reviewed and changed as needed.   Allergies  Allergen Reactions  . Penicillins Anaphylaxis and Swelling  . Tussin Dm [Guaifenesin-Dm] Other (See Comments)    Dizziness/ Lightheadness  . Zithromax [Azithromycin] Other (See Comments)    Pre-syncope    Review of Systems: General:  No F/C, wt loss Pulm:   No DIB, pleuritic chest pain Card:  No CP, palpitations Abd:  No n/v/d or pain Ext:  No inc edema from baseline   Objective:   Blood pressure 133/79, pulse 82, temperature 98.1 F (36.7 C), temperature source Oral, height 5\' 3"  (1.6 m), weight 148 lb 11.2 oz (67.4 kg). Body mass index is 26.34 kg/m. General: Well Developed, well nourished, appropriate for stated age.  Neuro: Alert and oriented x3, extra-ocular muscles intact, sensation grossly intact.  HEENT: Normocephalic, atraumatic, pupils equal round reactive to light, neck supple, no masses, no painful lymphadenopathy, TM's intact B/L, no  acute findings. Nares- patent, clear d/c, OP- clear, mild erythema, No TTP sinuses Skin: Warm and dry, no gross rash. Cardiac: RRR, S1 S2,  no murmurs rubs or gallops.  Respiratory: ECTA B/L and A/P--> but some exp wheezes- scattered throughout, Not using accessory muscles, speaking in full sentences- unlabored Vascular:  No gross lower ext edema, cap RF less 2 sec. Psych: No HI/SI, judgement and insight good, Euthymic mood. Full Affect.   Patient Care Team    Relationship Specialty Notifications Start End  Mellody Dance, DO PCP - General Family Medicine  08/19/15   Juanita Craver, MD Consulting Physician Gastroenterology  09/24/15   Lorretta Harp, MD Consulting Physician Cardiology  09/24/15   Everlene Farrier, MD Consulting Physician Obstetrics and Gynecology  09/24/15

## 2016-02-20 ENCOUNTER — Telehealth: Payer: Self-pay | Admitting: Family Medicine

## 2016-02-20 NOTE — Telephone Encounter (Signed)
z pak. thnx

## 2016-02-20 NOTE — Telephone Encounter (Signed)
Did you want to prescribe antibiotics?  Please advise.  Charyl Bigger, CMA

## 2016-02-20 NOTE — Telephone Encounter (Signed)
Patient was seen a few days ago for her illness, she is taking the prednisone like she's supposed to, and over the counter Mucinex but still has a rattling in her chest and was wondering if there is something else she should be doing or another med to be taking.

## 2016-02-21 ENCOUNTER — Telehealth: Payer: Self-pay

## 2016-02-21 ENCOUNTER — Other Ambulatory Visit: Payer: Self-pay | Admitting: Cardiovascular Disease

## 2016-02-21 MED ORDER — DOXYCYCLINE HYCLATE 100 MG PO TABS: 100.0000 mg | ORAL_TABLET | Freq: Two times a day (BID) | ORAL | 0 refills | Status: DC

## 2016-02-21 MED ORDER — AZITHROMYCIN 250 MG PO TABS: ORAL_TABLET | ORAL | 0 refills | Status: DC

## 2016-02-21 NOTE — Telephone Encounter (Signed)
Pt called backing stating that she has previously had an intolerance to Z-pak in that it causes pre-syncope.  Pt will being working during the course of the antibiotics and would like this RX changed to something else.  Please advise.  Charyl Bigger, CMA

## 2016-02-21 NOTE — Telephone Encounter (Signed)
LVM informing pt new RX sent to pharmacy.  Charyl Bigger, CMA

## 2016-02-21 NOTE — Telephone Encounter (Signed)
Please see previous note.

## 2016-02-21 NOTE — Telephone Encounter (Signed)
Rx(s) sent to pharmacy electronically.  

## 2016-02-21 NOTE — Telephone Encounter (Signed)
rx sent for doxycycline since she is allergic to penicillin and has an intolerance to azithromycin. Sent to [facility]. She had 2 different local pharmacies on file so I think this is the more recent one she has used.  Beatrice Lecher, MD

## 2016-02-21 NOTE — Addendum Note (Signed)
Addended by: Fonnie Mu on: 02/21/2016 09:02 AM   Modules accepted: Orders

## 2016-02-21 NOTE — Telephone Encounter (Signed)
Pt informed of RX for Zpak.  Charyl Bigger, CMA

## 2016-03-02 ENCOUNTER — Ambulatory Visit: Payer: Medicare Other | Admitting: Adult Health

## 2016-03-06 ENCOUNTER — Encounter: Payer: Self-pay | Admitting: Adult Health

## 2016-03-06 ENCOUNTER — Ambulatory Visit (INDEPENDENT_AMBULATORY_CARE_PROVIDER_SITE_OTHER): Payer: Medicare Other | Admitting: Adult Health

## 2016-03-06 DIAGNOSIS — J040 Acute laryngitis: Secondary | ICD-10-CM | POA: Diagnosis not present

## 2016-03-06 DIAGNOSIS — R05 Cough: Secondary | ICD-10-CM

## 2016-03-06 DIAGNOSIS — R059 Cough, unspecified: Secondary | ICD-10-CM | POA: Insufficient documentation

## 2016-03-06 NOTE — Assessment & Plan Note (Addendum)
Increase water/vit C. Warm tea with honey may help.

## 2016-03-06 NOTE — Progress Notes (Signed)
Subjective:    Patient ID: Brittany Harvey, female    DOB: 08-12-1945, 71 y.o.   MRN: [id number]  HPI:  Ms. Perrette presents for f/u for bronchitis.  She completed course of prednisone and has almost completed course of Doxycycline.  She denies CP/dyspnea/HA/NV/D/fever/night sweats/malaise/poor appetite.   Patient Care Team    Relationship Specialty Notifications Start End  Mellody Dance, DO PCP - General Family Medicine  08/19/15   Juanita Craver, MD Consulting Physician Gastroenterology  09/24/15   Lorretta Harp, MD Consulting Physician Cardiology  09/24/15   Everlene Farrier, MD Consulting Physician Obstetrics and Gynecology  09/24/15     Patient Active Problem List   Diagnosis Date Noted  . Cough 03/06/2016  . Laryngitis 03/06/2016  . Prediabetes 09/24/2015  . Overweight (BMI 25.0-29.9) 09/24/2015  . Elevated HDL = 90 09/24/2015  . Fatigue 09/24/2015  . Osteopenia by Dexa (8-9 yrs ago)  08/20/2015  . h/o HLD (hyperlipidemia) 08/20/2015  . Essential hypertension 07/24/2014  . GERD (gastroesophageal reflux disease) 11/27/2013  . PSVT (paroxysmal supraventricular tachycardia) ([place]) 03/27/2013     Past Medical History:  Diagnosis Date  . Anxiety   . Atypical chest pain   . GERD (gastroesophageal reflux disease)   . Heart palpitations   . Hypertension   . Kidney stones   . PSVT (paroxysmal supraventricular tachycardia) ([place])      Past Surgical History:  Procedure Laterality Date  . CARDIOVASCULAR STRESS TEST  06/19/2009   Post stress myocardial perfusion images show a normal pattern of perfusion in all regions. ECG positive for ischemia. Appears to be a "false-positive" ECG stress test.  . TUBAL LIGATION       Family History  Problem Relation Age of Onset  . Adopted: Yes     History  Drug Use No     History  Alcohol Use No     History  Smoking Status  . Former Smoker  . Quit date: 01/27/1992  Smokeless Tobacco  . Never Used     Outpatient Encounter  Prescriptions as of 03/06/2016  Medication Sig  . acetaminophen (TYLENOL) 500 MG tablet Take 500 mg by mouth daily as needed (pain).  [name] aspirin EC 81 MG tablet Take 81 mg by mouth daily.  [name] azithromycin (ZITHROMAX) 250 MG tablet Take 2 tablets by mouth the first then one tablet daily for 4 days.  . Calcium Citrate-Vitamin D (CALCIUM CITRATE + D PO) Take 1 tablet by mouth daily.  . Cholecalciferol (VITAMIN D3) 1000 units CAPS Take 5 capsules by mouth daily. Told pt to increase to 5,000 IU per day  . Cranberry 1000 MG CAPS Take 1 capsule by mouth daily.  . cyanocobalamin 500 MCG tablet Take 500 mcg by mouth daily.  [name] doxycycline (VIBRA-TABS) 100 MG tablet Take 1 tablet (100 mg total) by mouth 2 (two) times daily.  [name] ipratropium (ATROVENT HFA) 17 MCG/ACT inhaler Inhale 2 puffs into the lungs every 4 (four) hours as needed for wheezing.  . metoprolol succinate (TOPROL-XL) 25 MG 24 hr tablet Take 1.5 tablets (37.5 mg total) by mouth daily.  . Multiple Vitamin (MULTIVITAMIN WITH MINERALS) TABS tablet Take 1 tablet by mouth daily. Centrum Silver  . Omega-3 Fatty Acids (FISH OIL) 1000 MG CAPS Take 1 capsule by mouth daily.  . predniSONE (DELTASONE) 20 MG tablet Take 3 pills a day for 2 days, 2 pills a day for 2 days, 1 pill a day for 2 days then one half pill a day  for 2 days then off  . triamcinolone cream (KENALOG) 0.1 % Apply 1 application topically daily as needed (itching/eczema).    No facility-administered encounter medications on file as of 03/06/2016.     Allergies: Penicillins; Tussin dm [guaifenesin-dm]; and Zithromax [azithromycin]  Body mass index is 26.82 kg/m.  Blood pressure (!) 146/75, pulse 76, weight 151 lb 6.4 oz (68.7 kg).     Review of Systems  Constitutional: Negative for activity change, appetite change, chills, diaphoresis, fatigue and unexpected weight change.  HENT: Positive for voice change. Negative for congestion, postnasal drip, sinus pressure, sore throat and  trouble swallowing.        Hoarse voice for the last 1.5 weeks.  Denies sore throat or difficulty swallowing.  Eyes: Negative for visual disturbance.  Respiratory: Positive for cough. Negative for choking, shortness of breath and wheezing.        Non-productive intermittent cough-improving daily.  Cardiovascular: Negative for chest pain, palpitations and leg swelling.  Gastrointestinal: Negative for abdominal distention, abdominal pain, diarrhea and nausea.  Endocrine: Negative for cold intolerance, heat intolerance, polydipsia, polyphagia and polyuria.  Genitourinary: Negative for difficulty urinating and flank pain.  Skin: Negative for color change, pallor, rash and wound.  Neurological: Negative for dizziness.       Objective:   Physical Exam  Constitutional: She is oriented to person, place, and time. She appears well-developed and well-nourished. No distress.  HENT:  Head: Normocephalic and atraumatic.  Right Ear: External ear normal.  Left Ear: External ear normal.  Eyes: Conjunctivae and EOM are normal. Pupils are equal, round, and reactive to light.  Neck: Normal range of motion. Neck supple.  Cardiovascular: Normal rate, regular rhythm, normal heart sounds and intact distal pulses.   No murmur heard. Pulmonary/Chest: Effort normal and breath sounds normal. No respiratory distress. She has no wheezes. She has no rales. She exhibits no tenderness.  Lymphadenopathy:    She has no cervical adenopathy.  Neurological: She is alert and oriented to person, place, and time. She has normal reflexes.  Skin: Skin is warm and dry. No rash noted. She is not diaphoretic. No erythema. No pallor.  Psychiatric: She has a normal mood and affect. Her behavior is normal. Judgment and thought content normal.          Assessment & Plan:   1. Cough   2. Laryngitis     Laryngitis Increase water/vit C. Warm tea with honey may help.   Cough Complete Doxycycline as directed. Increase  fluids/vit C.    FOLLOW-UP:  Return if symptoms worsen or fail to improve.

## 2016-03-06 NOTE — Patient Instructions (Signed)
Cough, Adult Coughing is a reflex that clears your throat and your airways. Coughing helps to heal and protect your lungs. It is normal to cough occasionally, but a cough that happens with other symptoms or lasts a long time may be a sign of a condition that needs treatment. A cough may last only 2-3 weeks (acute), or it may last longer than 8 weeks (chronic). What are the causes? Coughing is commonly caused by:  Breathing in substances that irritate your lungs.  A viral or bacterial respiratory infection.  Allergies.  Asthma.  Postnasal drip.  Smoking.  Acid backing up from the stomach into the esophagus (gastroesophageal reflux).  Certain medicines.  Chronic lung problems, including COPD (or rarely, lung cancer).  Other medical conditions such as heart failure. Follow these instructions at home: Pay attention to any changes in your symptoms. Take these actions to help with your discomfort:  Take medicines only as told by your health care provider.  If you were prescribed an antibiotic medicine, take it as told by your health care provider. Do not stop taking the antibiotic even if you start to feel better.  Talk with your health care provider before you take a cough suppressant medicine.  Drink enough fluid to keep your urine clear or pale yellow.  If the air is dry, use a cold steam vaporizer or humidifier in your bedroom or your home to help loosen secretions.  Avoid anything that causes you to cough at work or at home.  If your cough is worse at night, try sleeping in a semi-upright position.  Avoid cigarette smoke. If you smoke, quit smoking. If you need help quitting, ask your health care provider.  Avoid caffeine.  Avoid alcohol.  Rest as needed. Contact a health care provider if:  You have new symptoms.  You cough up pus.  Your cough does not get better after 2-3 weeks, or your cough gets worse.  You cannot control your cough with suppressant medicines  and you are losing sleep.  You develop pain that is getting worse or pain that is not controlled with pain medicines.  You have a fever.  You have unexplained weight loss.  You have night sweats. Get help right away if:  You cough up blood.  You have difficulty breathing.  Your heartbeat is very fast. This information is not intended to replace advice given to you by your health care provider. Make sure you discuss any questions you have with your health care provider. Document Released: 07/11/2010 Document Revised: 06/20/2015 Document Reviewed: 03/21/2014 Elsevier Interactive Patient Education  2017 [employer]  Complete Doxycycline as directed. Increase water/vit C. Practice hand hygiene. Please call clinic with any questions/concerns.

## 2016-03-06 NOTE — Assessment & Plan Note (Signed)
Complete Doxycycline as directed. Increase fluids/vit C.

## 2016-04-02 ENCOUNTER — Ambulatory Visit: Payer: Medicare Other | Admitting: Family Medicine

## 2016-04-23 IMAGING — MG MM DIAG BREAST TOMO UNI RIGHT
6 series · 6 of 18 positions shown · non-contrast
Comparison: Previous examinations, the most recent dated
02/12/2014.

CLINICAL DATA: Small mass felt by the patient in the upper inner
right breast.

EXAM:
DIGITAL DIAGNOSTIC RIGHT MAMMOGRAM WITH 3D TOMOSYNTHESIS WITH CAD
ULTRASOUND RIGHT BREAST

[R CC]
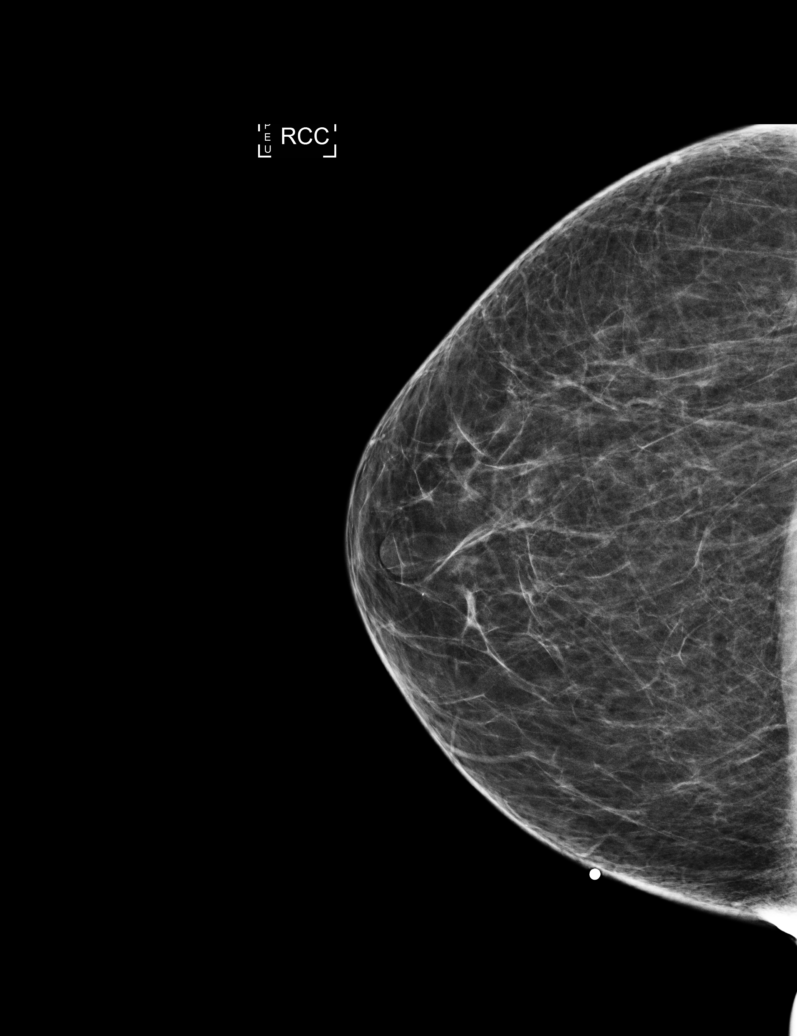

[R TAN]
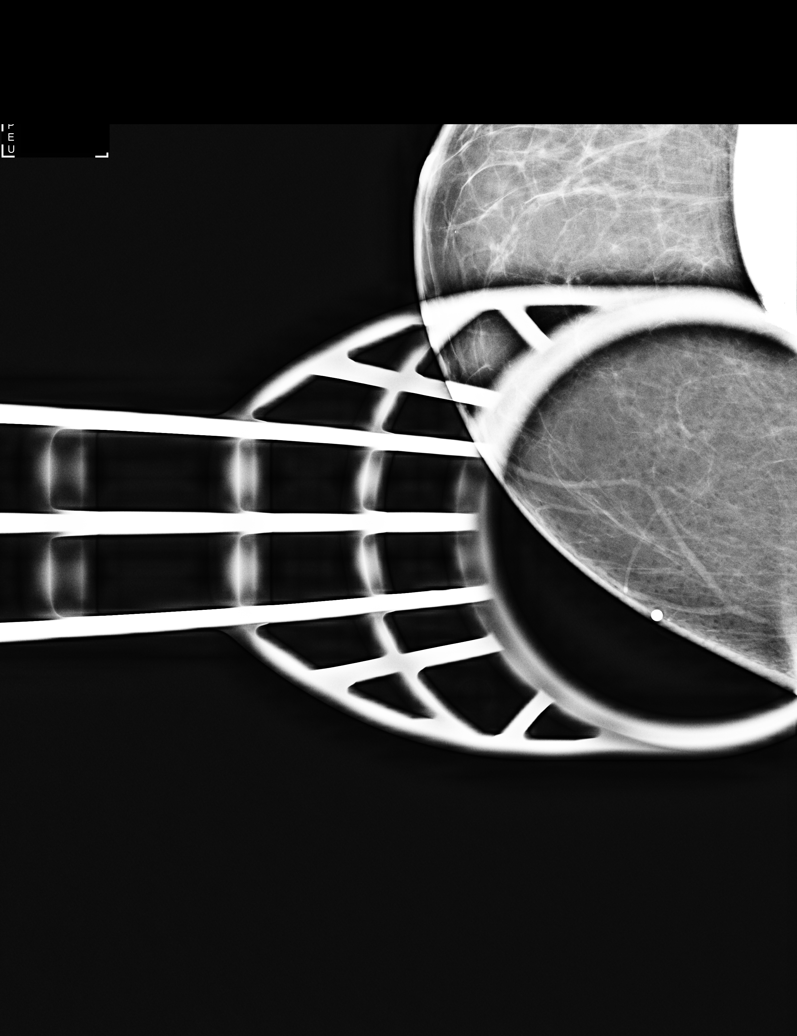

[R MLO]
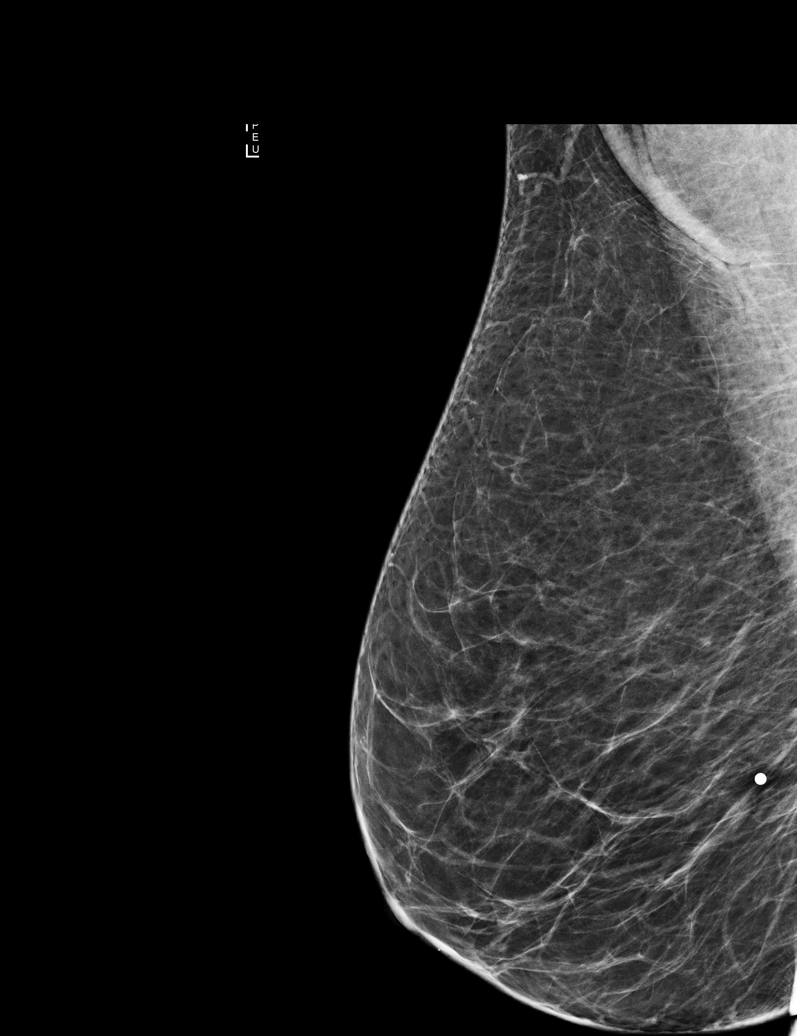

[R MLO tomo · tomo slice 27/53.0]
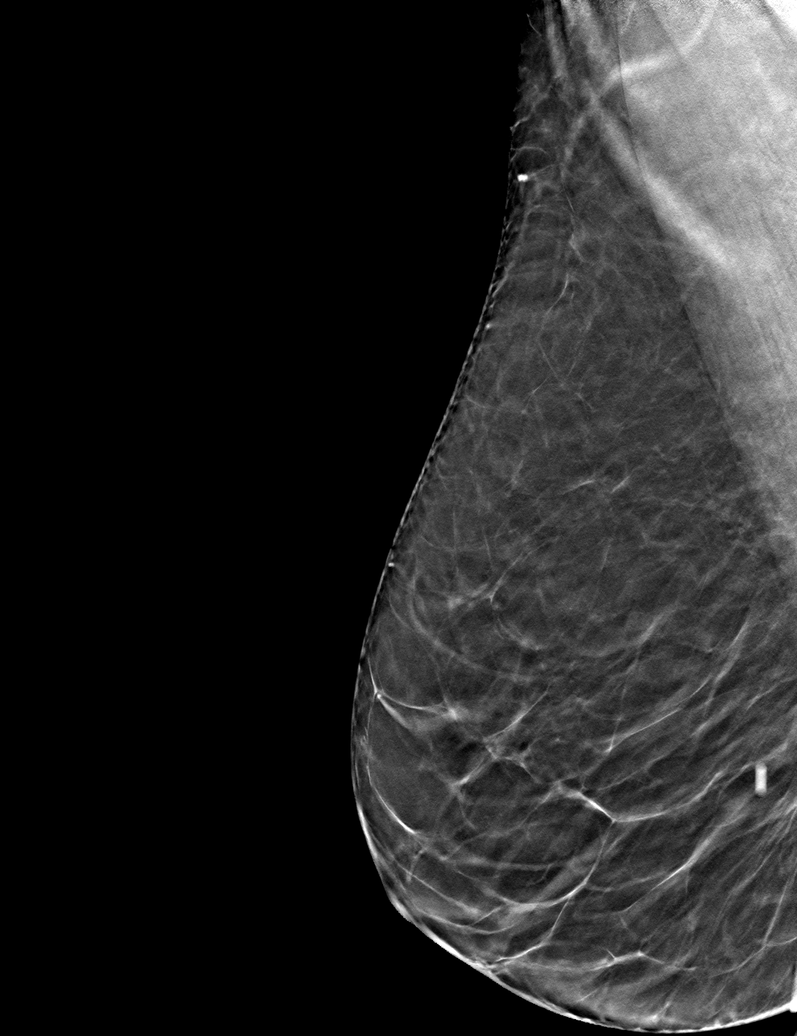

[R CC tomo · tomo slice 29/57.0]
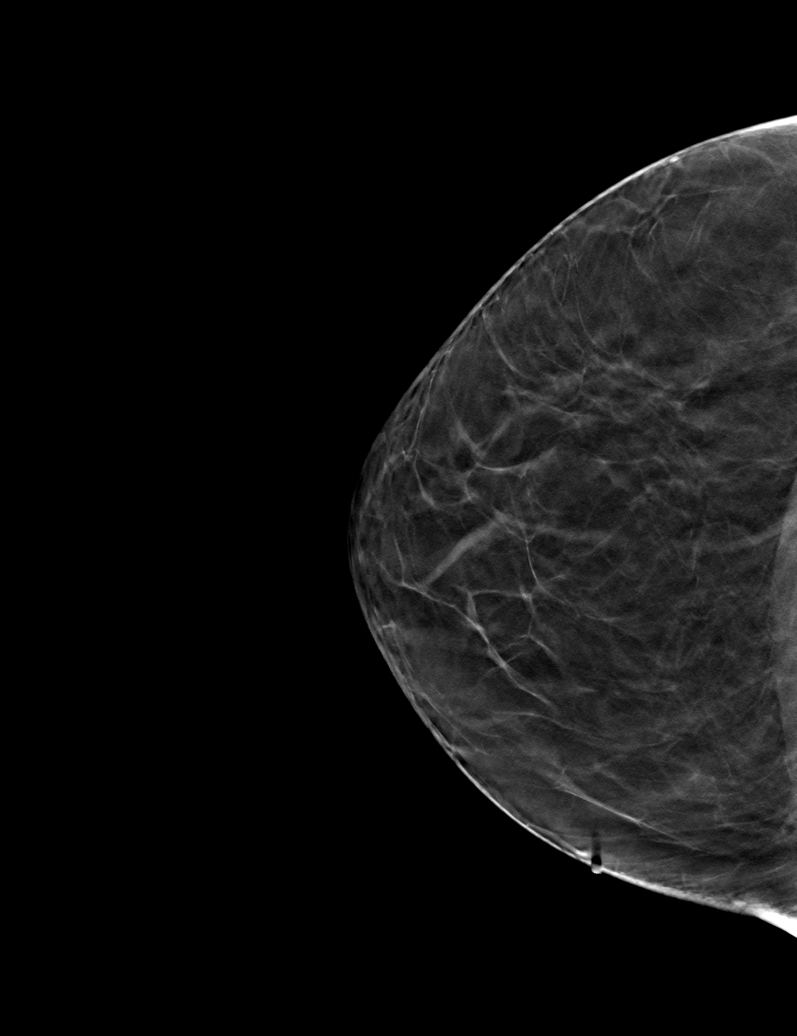

[R TAN tomo · tomo slice 19/37.0]
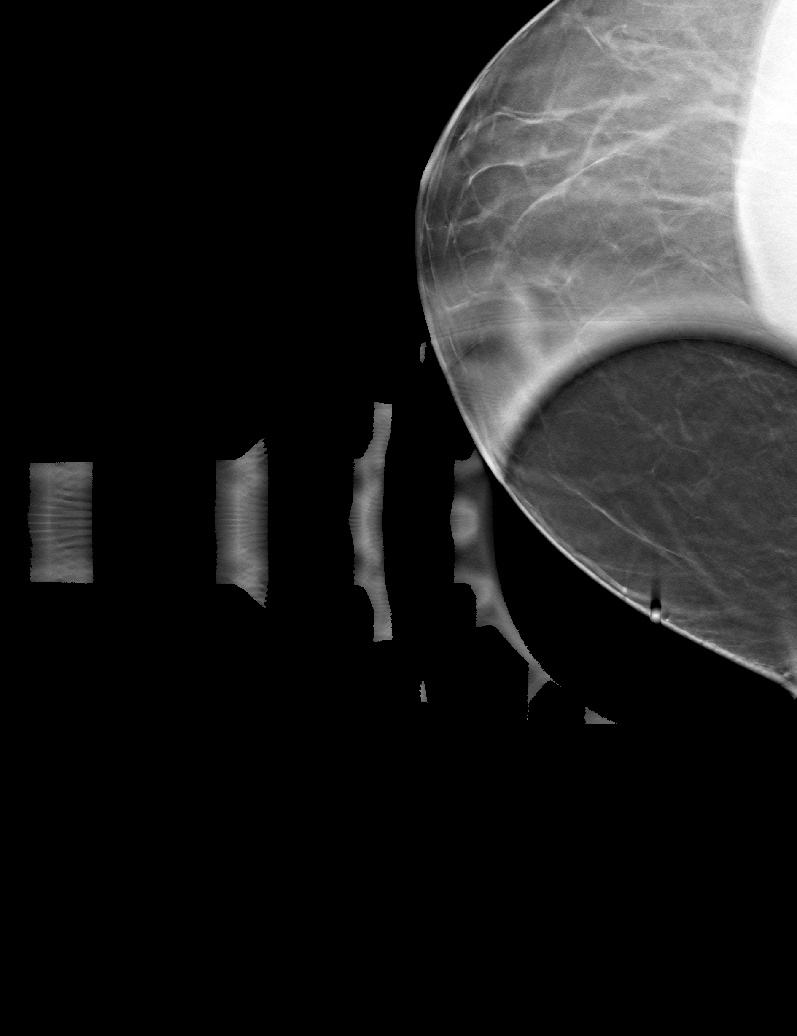

[6 of 18 positions shown; findings below may reference images not displayed]

ACR Breast Density Category b: There are scattered areas of
fibroglandular density.
FINDINGS: 3D mammographic views of the right breast demonstrate normal
appearing breast tissue without change from previous examinations.
This includes normal appearing fatty tissue at the location of
patient concern, marked with a metallic marker.

Mammographic images were processed with CAD.

On physical exam, the patient has an approximately 6 mm rounded,
palpable mass in the 2:30 o'clock position of the right breast, 10
cm from the nipple.

Targeted ultrasound is performed, showing a 1.1 x 1.0 x 0.7 cm oval,
circumscribed, medium echotexture mass in the 2:30 o'clock position
of the right breast, 10 cm from the nipple. This appears similar to
adjacent fat lobules.
IMPRESSION: 1.1 cm probable lipoma in the 2:30 o'clock position of the right
breast, corresponding to the mass felt by the patient. A focally
prominent fat lobule is less likely. No evidence of malignancy.

RECOMMENDATION:
Bilateral screening mammogram in 10 months. That will be 1 year
since mammographic evaluation of the left breast.

I have discussed the findings and recommendations with the patient.
Results were also provided in writing at the conclusion of the
visit. If applicable, a reminder letter will be sent to the patient
regarding the next appointment.

BI-RADS CATEGORY  2: Benign.

## 2016-05-01 ENCOUNTER — Telehealth: Payer: Self-pay | Admitting: Cardiovascular Disease

## 2016-05-01 NOTE — Telephone Encounter (Signed)
Returned call to patient For a couple of days, she has had a "sensation on her left side" like a "little vibration" - describes as a little flutter (endorses heart palpitations, which is not new) This occurs when exertion, or just with walking Episodes are short-lived - lasting a few seconds  She states she is unsure if this is a nerve, her arms have also been hurting - she states she was told she has bursitis  Her PCP told her she has valvular regurgitation She has no pain in her chest, no SOB  h/o PSVT w/adenosine cardioversion   Reports her BP is fine  Offered PA OV next week but she prefers to get MD opinion. Will defer to Dr. Gwenlyn Found for advice

## 2016-05-01 NOTE — Telephone Encounter (Signed)
New Message  Pt voiced wanting a nurse to give her a call//Pt would not go into detail.  Please f/u

## 2016-05-06 NOTE — Telephone Encounter (Signed)
Happy to see her when there is no opening in my schedule

## 2016-05-06 NOTE — Telephone Encounter (Signed)
Patient notified of MD advice. She states her issues have resolved. She sees PCP tomorrow. No further action needed.

## 2016-05-06 NOTE — Telephone Encounter (Signed)
LMTCB

## 2016-05-06 NOTE — Telephone Encounter (Signed)
F/U call:  Patient returning your call, please call on the home # [phone]. Thanks.

## 2016-05-07 ENCOUNTER — Encounter: Payer: Self-pay | Admitting: Adult Health

## 2016-05-07 ENCOUNTER — Ambulatory Visit (INDEPENDENT_AMBULATORY_CARE_PROVIDER_SITE_OTHER): Payer: Medicare Other | Admitting: Adult Health

## 2016-05-07 VITALS — BP 153/72 | HR 69 | Ht 63.0 in | Wt 150.0 lb

## 2016-05-07 DIAGNOSIS — R0789 Other chest pain: Secondary | ICD-10-CM | POA: Diagnosis not present

## 2016-05-07 DIAGNOSIS — I10 Essential (primary) hypertension: Secondary | ICD-10-CM

## 2016-05-07 DIAGNOSIS — S29012A Strain of muscle and tendon of back wall of thorax, initial encounter: Secondary | ICD-10-CM

## 2016-05-07 MED ORDER — IPRATROPIUM BROMIDE HFA 17 MCG/ACT IN AERS: 2.0000 | INHALATION_SPRAY | RESPIRATORY_TRACT | 1 refills | Status: DC | PRN

## 2016-05-07 NOTE — Progress Notes (Signed)
Subjective:    Patient ID: Brittany Harvey, female    DOB: August 12, 1945, 71 y.o.   MRN: [id number]  HPI :  Brittany Harvey presents with "upper back tightness, no real pain" that developed spontaneously last week.  She denies acute trauma/injury prior to onset of sx's.  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  She has hx of bil upper ext bursitis that is being treated by Ortho.  She has been taking OTC Acetaminophen intermittently and reports she cannot take Aleve b/c "it knocks me out".   Of Note:  She has been having SOB when going to bed the last few nights.  She denies hx of asthma however has an inhaler "just in case I can't catch my breath". She denies CP/palpitations. She called her Cardiologist recently for "vibration sensation in chest, over my left breast".  Patient Care Team    Relationship Specialty Notifications Start End  Mellody Dance, DO PCP - General Family Medicine  08/19/15   Juanita Craver, MD Consulting Physician Gastroenterology  09/24/15   Lorretta Harp, MD Consulting Physician Cardiology  09/24/15   Everlene Farrier, MD Consulting Physician Obstetrics and Gynecology  09/24/15     Patient Active Problem List   Diagnosis Date Noted  . Cough 03/06/2016  . Laryngitis 03/06/2016  . Prediabetes 09/24/2015  . Overweight (BMI 25.0-29.9) 09/24/2015  . Elevated HDL = 90 09/24/2015  . Fatigue 09/24/2015  . Osteopenia by Dexa (8-9 yrs ago)  08/20/2015  . h/o HLD (hyperlipidemia) 08/20/2015  . Essential hypertension 07/24/2014  . GERD (gastroesophageal reflux disease) 11/27/2013  . PSVT (paroxysmal supraventricular tachycardia) ([place]) 03/27/2013     Past Medical History:  Diagnosis Date  . Anxiety   . Atypical chest pain   . GERD (gastroesophageal reflux disease)   . Heart palpitations   . Hypertension   . Kidney stones   . PSVT (paroxysmal supraventricular tachycardia) ([place])      Past Surgical History:  Procedure Laterality Date   . CARDIOVASCULAR STRESS TEST  06/19/2009   Post stress myocardial perfusion images show a normal pattern of perfusion in all regions. ECG positive for ischemia. Appears to be a "false-positive" ECG stress test.  . TUBAL LIGATION       Family History  Problem Relation Age of Onset  . Adopted: Yes     History  Drug Use No     History  Alcohol Use No     History  Smoking Status  . Former Smoker  . Quit date: 01/27/1992  Smokeless Tobacco  . Never Used     Outpatient Encounter Prescriptions as of 05/07/2016  Medication Sig  . acetaminophen (TYLENOL) 500 MG tablet Take 500 mg by mouth daily as needed (pain).  [name] aspirin EC 81 MG tablet Take 81 mg by mouth daily.  . Calcium Citrate-Vitamin D (CALCIUM CITRATE + D PO) Take 1 tablet by mouth daily.  . Cholecalciferol (VITAMIN D3) 1000 units CAPS Take 5 capsules by mouth daily. Told pt to increase to 5,000 IU per day  . Cranberry 1000 MG CAPS Take 1 capsule by mouth daily.  [name] ipratropium (ATROVENT HFA) 17 MCG/ACT inhaler Inhale 2 puffs into the lungs every 4 (four) hours as needed for wheezing.  . metoprolol succinate (TOPROL-XL) 25 MG 24 hr tablet Take 1.5 tablets (37.5 mg total) by mouth daily.  . Multiple Vitamin (MULTIVITAMIN WITH MINERALS) TABS tablet Take 1 tablet by mouth daily. Centrum Silver  .  Omega-3 Fatty Acids (FISH OIL) 1000 MG CAPS Take 1 capsule by mouth daily.  [name] triamcinolone cream (KENALOG) 0.1 % Apply 1 application topically daily as needed (itching/eczema).   . [DISCONTINUED] azithromycin (ZITHROMAX) 250 MG tablet Take 2 tablets by mouth the first then one tablet daily for 4 days.  . [DISCONTINUED] cyanocobalamin 500 MCG tablet Take 500 mcg by mouth daily.  . [DISCONTINUED] doxycycline (VIBRA-TABS) 100 MG tablet Take 1 tablet (100 mg total) by mouth 2 (two) times daily.  . [DISCONTINUED] ipratropium (ATROVENT HFA) 17 MCG/ACT inhaler Inhale 2 puffs into the lungs every 4 (four) hours as needed for wheezing.  .  [DISCONTINUED] predniSONE (DELTASONE) 20 MG tablet Take 3 pills a day for 2 days, 2 pills a day for 2 days, 1 pill a day for 2 days then one half pill a day for 2 days then off   No facility-administered encounter medications on file as of 05/07/2016.     Allergies: Penicillins; Tussin dm [guaifenesin-dm]; and Zithromax [azithromycin]  Body mass index is 26.57 kg/m.  Blood pressure (!) 153/72, pulse 69, height 5\' 3"  (1.6 m), weight 150 lb (68 kg).    Review of Systems  Constitutional: Positive for fatigue. Negative for activity change, appetite change, chills, diaphoresis, fever and unexpected weight change.  Eyes: Negative for visual disturbance.  Respiratory: Positive for chest tightness and shortness of breath. Negative for cough, wheezing and stridor.   Cardiovascular: Negative for chest pain, palpitations and leg swelling.  Endocrine: Negative for cold intolerance, heat intolerance, polydipsia, polyphagia and polyuria.  Musculoskeletal: Positive for arthralgias, myalgias and neck stiffness. Negative for back pain, gait problem, joint swelling and neck pain.  Skin: Negative for color change, pallor, rash and wound.  Neurological: Negative for dizziness, tremors, weakness and headaches.  Hematological: Does not bruise/bleed easily.       Objective:   Physical Exam  Constitutional: She appears well-developed and well-nourished. No distress.  HENT:  Head: Normocephalic and atraumatic.  Eyes: Conjunctivae are normal. Pupils are equal, round, and reactive to light.  Neck: Normal range of motion. Neck supple. Muscular tenderness present. No neck rigidity. Normal range of motion present.  Cardiovascular: Normal rate, regular rhythm, normal heart sounds and intact distal pulses.   Pulmonary/Chest: Effort normal and breath sounds normal. No respiratory distress. She has no wheezes. She has no rales. She exhibits no tenderness.  Musculoskeletal: Normal range of motion. She exhibits no  edema, tenderness or deformity.       Cervical back: She exhibits spasm. She exhibits normal range of motion, no bony tenderness, no edema and no pain.       Thoracic back: She exhibits normal range of motion and no tenderness.       Lumbar back: She exhibits normal range of motion and no tenderness.  Lymphadenopathy:    She has no cervical adenopathy.  Skin: She is not diaphoretic.          Assessment & Plan:   1. Essential hypertension   2. Upper back strain, initial encounter     Upper back strain Due to contraindications of muscle relaxers in 74 and older and her hx of medication sensitivity-do not feel Rx would be prudent. Instead please perform the above neck exercises 1-2 times a day. Use OTC Tylenol and BenGay Cream per manufacturer's instructions. Reviewed EKG results-[facility] EKG-please follow-up with your Cardiologist as soon as you can. If neck "tightness" does not improve in 2-3 weeks, please call clinic. Atrovent Inhaler refilled.  Essential  hypertension EKG: NSR Junctional ST Depression, probably normal [facility] ECG Advised to see Cards ASAP.    FOLLOW-UP:  Return if symptoms worsen or fail to improve.

## 2016-05-07 NOTE — Patient Instructions (Addendum)
Neck Exercises Neck exercises can be important for many reasons:  They can help you to improve and maintain flexibility in your neck. This can be especially important as you age.  They can help to make your neck stronger. This can make movement easier.  They can reduce or prevent neck pain.  They may help your upper back. Ask your health care provider which neck exercises would be best for you. Exercises Neck Press  Repeat this exercise 10 times. Do it first thing in the morning and right before bed or as told by your health care provider. 1. Lie on your back on a firm bed or on the floor with a pillow under your head. 2. Use your neck muscles to push your head down on the pillow and straighten your spine. 3. Hold the position as well as you can. Keep your head facing up and your chin tucked. 4. Slowly count to 5 while holding this position. 5. Relax for a few seconds. Then repeat. Isometric Strengthening  Do a full set of these exercises 2 times a day or as told by your health care provider. 1. Sit in a supportive chair and place your hand on your forehead. 2. Push forward with your head and neck while pushing back with your hand. Hold for 10 seconds. 3. Relax. Then repeat the exercise 3 times. 4. Next, do thesequence again, this time putting your hand against the back of your head. Use your head and neck to push backward against the hand pressure. 5. Finally, do the same exercise on either side of your head, pushing sideways against the pressure of your hand. Prone Head Lifts  Repeat this exercise 5 times. Do this 2 times a day or as told by your health care provider. 1. Lie face-down, resting on your elbows so that your chest and upper back are raised. 2. Start with your head facing downward, near your chest. Position your chin either on or near your chest. 3. Slowly lift your head upward. Lift until you are looking straight ahead. Then continue lifting your head as far back as you  can stretch. 4. Hold your head up for 5 seconds. Then slowly lower it to your starting position. Supine Head Lifts  Repeat this exercise 8-10 times. Do this 2 times a day or as told by your health care provider. 1. Lie on your back, bending your knees to point to the ceiling and keeping your feet flat on the floor. 2. Lift your head slowly off the floor, raising your chin toward your chest. 3. Hold for 5 seconds. 4. Relax and repeat. Scapular Retraction  Repeat this exercise 5 times. Do this 2 times a day or as told by your health care provider. 1. Stand with your arms at your sides. Look straight ahead. 2. Slowly pull both shoulders backward and downward until you feel a stretch between your shoulder blades in your upper back. 3. Hold for 10-30 seconds. 4. Relax and repeat. Contact a health care provider if:  Your neck pain or discomfort gets much worse when you do an exercise.  Your neck pain or discomfort does not improve within 2 hours after you exercise. If you have any of these problems, stop exercising right away. Do not do the exercises again unless your health care provider says that you can. Get help right away if:  You develop sudden, severe neck pain. If this happens, stop exercising right away. Do not do the exercises again unless your health  care provider says that you can. Exercises Neck Stretch  Repeat this exercise 3-5 times. 1. Do this exercise while standing or while sitting in a chair. 2. Place your feet flat on the floor, shoulder-width apart. 3. Slowly turn your head to the right. Turn it all the way to the right so you can look over your right shoulder. Do not tilt or tip your head. 4. Hold this position for 10-30 seconds. 5. Slowly turn your head to the left, to look over your left shoulder. 6. Hold this position for 10-30 seconds. Neck Retraction Repeat this exercise 8-10 times. Do this 3-4 times a day or as told by your health care provider. 1. Do this  exercise while standing or while sitting in a sturdy chair. 2. Look straight ahead. Do not bend your neck. 3. Use your fingers to push your chin backward. Do not bend your neck for this movement. Continue to face straight ahead. If you are doing the exercise properly, you will feel a slight sensation in your throat and a stretch at the back of your neck. 4. Hold the stretch for 1-2 seconds. Relax and repeat. This information is not intended to replace advice given to you by your health care provider. Make sure you discuss any questions you have with your health care provider. Document Released: 12/24/2014 Document Revised: 06/20/2015 Document Reviewed: 07/23/2014 Elsevier Interactive Patient Education  2017 [employer].  Due to contraindications of muscle relaxers in 65 and older and her hx of medication sensitivity-do not feel Rx would be prudent. Instead please perform the above neck exercises 1-2 times a day. Use OTC Tylenol and BenGay Cream per manufacturer's instructions. Reviewed EKG results-[facility]line EKG-please follow-up with your Cardiologist as soon as you can. If neck "tightness" does not improve in 2-3 weeks, please call clinic. Atrovent Inhaler refilled.

## 2016-05-07 NOTE — Assessment & Plan Note (Signed)
EKG: NSR Junctional ST Depression, probably normal [facility] ECG Advised to see Cards ASAP.

## 2016-05-07 NOTE — Addendum Note (Signed)
Addended by: Fonnie Mu on: 05/07/2016 01:51 PM   Modules accepted: Orders

## 2016-05-07 NOTE — Assessment & Plan Note (Signed)
Due to contraindications of muscle relaxers in 65 and older and her hx of medication sensitivity-do not feel Rx would be prudent. Instead please perform the above neck exercises 1-2 times a day. Use OTC Tylenol and BenGay Cream per manufacturer's instructions. Reviewed EKG results-[facility] EKG-please follow-up with your Cardiologist as soon as you can. If neck "tightness" does not improve in 2-3 weeks, please call clinic. Atrovent Inhaler refilled.

## 2016-05-12 ENCOUNTER — Ambulatory Visit: Payer: Medicare Other | Admitting: Family Medicine

## 2016-05-15 DIAGNOSIS — Z1231 Encounter for screening mammogram for malignant neoplasm of breast: Secondary | ICD-10-CM | POA: Diagnosis not present

## 2016-05-15 DIAGNOSIS — Z01419 Encounter for gynecological examination (general) (routine) without abnormal findings: Secondary | ICD-10-CM | POA: Diagnosis not present

## 2016-05-15 DIAGNOSIS — N61 Mastitis without abscess: Secondary | ICD-10-CM | POA: Diagnosis not present

## 2016-05-15 DIAGNOSIS — Z6828 Body mass index (BMI) 28.0-28.9, adult: Secondary | ICD-10-CM | POA: Diagnosis not present

## 2016-05-15 DIAGNOSIS — Z780 Asymptomatic menopausal state: Secondary | ICD-10-CM | POA: Diagnosis not present

## 2016-05-15 DIAGNOSIS — E559 Vitamin D deficiency, unspecified: Secondary | ICD-10-CM | POA: Diagnosis not present

## 2016-05-18 LAB — HM DEXA SCAN

## 2016-05-18 LAB — HM MAMMOGRAPHY: HM Mammogram: NORMAL (ref 0–4)

## 2016-06-04 DIAGNOSIS — N61 Mastitis without abscess: Secondary | ICD-10-CM | POA: Diagnosis not present

## 2016-06-10 ENCOUNTER — Encounter: Payer: Self-pay | Admitting: Family Medicine

## 2016-06-10 ENCOUNTER — Ambulatory Visit (INDEPENDENT_AMBULATORY_CARE_PROVIDER_SITE_OTHER): Payer: Medicare Other | Admitting: Family Medicine

## 2016-06-10 VITALS — BP 138/72 | HR 61 | Ht 63.0 in | Wt 150.3 lb

## 2016-06-10 DIAGNOSIS — H8113 Benign paroxysmal vertigo, bilateral: Secondary | ICD-10-CM | POA: Diagnosis not present

## 2016-06-10 DIAGNOSIS — R7303 Prediabetes: Secondary | ICD-10-CM

## 2016-06-10 DIAGNOSIS — I10 Essential (primary) hypertension: Secondary | ICD-10-CM | POA: Diagnosis not present

## 2016-06-10 DIAGNOSIS — J3089 Other allergic rhinitis: Secondary | ICD-10-CM | POA: Diagnosis not present

## 2016-06-10 LAB — POCT GLYCOSYLATED HEMOGLOBIN (HGB A1C): Hemoglobin A1C: 5.6

## 2016-06-10 MED ORDER — FLUTICASONE PROPIONATE 50 MCG/ACT NA SUSP: 1.0000 | Freq: Two times a day (BID) | NASAL | 6 refills | Status: DC

## 2016-06-10 MED ORDER — LORATADINE 10 MG PO TABS: 10.0000 mg | ORAL_TABLET | Freq: Every day | ORAL | 11 refills | Status: DC

## 2016-06-10 NOTE — Patient Instructions (Addendum)
At first signs of having vertigo sx--> do exercises daily  - Atrovent is not to be used for actue rescue inhaler, but takes 1-2 hrs to kick in and is used to dec mucus prod in lungs for illness- pneumonia, asthma etc.    If you need acute rescue inhaler--> use ALBUTEROL. Let us know if you feel you need this.    How to Treat Vertigo at Home with Exercises  What is Vertigo?  Vertigo is a relatively common symptom most often associated with conditions such as sinusitis (inflammation of your sinuses due to viruses, allergies, or bacterial infections), or an inner ear infection or ear trauma.   It can be brought on by trauma (e.g. a blow to the head or whiplash) or more serious things like minor strokes.   Symptoms can also be brought on by normal degenerative changes to your inner ear that occur with aging.  The condition tends to be more commonly seen in the elderly but it can occur in all ages.    Patients most often complain of dizziness, as if the room is spinning around them.   Symptoms are provoked by quick head movements or changes in position like going from standing to lying in bed, or even turning over in bed.   It may present with nausea and/or vomiting, and can be very debilitating to some folks.    By far the most common cause, known as Benign Paroxysmal Positional Vertigo (BPPV), is categorized by a sudden onset of symptoms, that are intense but short-lived (60 seconds or less), which is triggered by a change in head position.   Symptoms usually dissipate if you stay in one position and do not move your head.   Within the inner ear are collections of calcium carbonate crystals referred to as "otoliths" which may become dislodged from their normal position and migrate into the semicircular canals of the inner ear, throwing off your body's ability to sense where you are in space.     Fig. 921 Anatomy of the Right Osseous Labyrinth. Antonieta Iba. Anatomy of the Human Body. 1918.             What Else Could Be Behind My Vertigo?  Some other causes of vertigo include:  Meniere's disease (disorder of inner ear with ringing in ears, feeling of fullness/pressure within ear, and fluctuating hearing loss) Tumours Neurological disorders e.g. Multiple Sclerosis Motion Sickness (lack of coordination between visual stimuli, inner ear balance and positional sense) Migraine Labyrinthitis (inflammation of the fluid-filled tubes and sacs within the inner ear; may also be associated with changes in hearing) Vestibular neuritis (inflammation of the nerves associated with transmission of sensory info from the inner ear; usually of viral origins)  How it can be treated/cured? While certain medications have been prescribed for vertigo including Lorazepam your doing well 7 house the house going organizing and getting things ready for sale with the and Meclizine (for motion sickness), there exists no evidence to support a recommendation of any medication in the routine treatment of BPPV.  Clinical trials have demonstrated that repositioning techniques (listed below) are a superior option for management Otis Dials et al., 2008).    Figure above:  (A) Instructions for the modified Epley procedure (MEP) for left ear posterior canal benign paroxysmal positional vertigo (PC-BPPV). For right ear BPPV, the procedure has to be performed in the opposite direction, starting with the head turned to the right side.  1. Start by sitting on a bed with your  head turned 45 to the left. Place a pillow behind you so that on lying back it will be under your shoulders.  2. Lie back quickly with shoulders on the pillow, neck extended, and head resting on the bed. In this position, the affected (left) ear is underneath. Wait for 30 secondS.  3. Turn your head 90 to the right (without raising it), and wait again for 30 seconds.  4. Turn your body and head another 90 to the right, and wait for another 30 seconds.  5.  Sit up on the right side. This maneuver should be performed three times a day. Repeat this daily until you are free from positional vertigo for 24 hours.   (B) Instructions for the modified Semont maneuver (MSM) for left ear PC-BPPV. For right ear BPPV, the maneuver has to be performed in the opposite direction, starting with the head turned toward the left ear.  1. Sit upright on a bed with your head turned 45 toward the right ear.  2. Drop quickly to the left side, so that your head touches the bed behind your left ear. Wait 30 seconds.  3. Move head and trunk in a swift movement toward the other side without stopping in the upright position, so that your head comes to rest on the right side of your forehead. Wait again for 30 seconds.  4. Sit up again.  This maneuver should be performed three times a day. Repeat this daily until you are free from positional vertigo symptoms for 24 hours.   (   See the video in the supplementary material on the NeurologyWeb site; go to http://www.neurology.org/content/63/1/150/F1.expansion.html   )     You can also try this motion at home as well- Self-Treatment of Benign Paroxysmal Positional Vertigo Benign Paroxysmal Positioning Vertigo is caused by loose inner ear crystals in the inner ear that migrate while sleeping to the back-bottom inner ear balance canal, the so-called "posterior semi-circular canal." The maneuver demonstrated below is the way to reposition the loose crystals so that the symptoms caused by the loose crystals go away. You may have a floating, swaying sense while walking or sitting for a few days after this procedure.

## 2016-06-10 NOTE — Assessment & Plan Note (Signed)
Recheck in 6-71mo A1c  Prudent diet and walking

## 2016-06-10 NOTE — Progress Notes (Signed)
Impression and Recommendations:    1. Prediabetes  - reck q 6-12 mo - counseling done Lab Results  Component Value Date   HGBA1C 5.6 06/10/2016   HGBA1C 5.8 05/09/2015     2. Benign positional vertigo, bilateral  - sx well controlled currently, no concerns   3. Essential hypertension  - well controlled; cont meds. - diet/ lifestyle counseling done   4. Environmental and seasonal allergies  - advised cont meds; lifestyle mod to help prevent sx d/c pt - counseling done     Benign positional vertigo, bilateral occ gets vertigo- usually in spring.    Prediabetes Recheck in 6-104mo A1c  Prudent diet and walking    Education and routine counseling performed. Handouts provided.   Meds ordered this encounter  Medications  . fluticasone (FLONASE) 50 MCG/ACT nasal spray    Sig: Place 1 spray into both nostrils 2 (two) times daily.    Dispense:  16 g    Refill:  6  . loratadine (CLARITIN) 10 MG tablet    Sig: Take 1 tablet (10 mg total) by mouth daily.    Dispense:  30 tablet    Refill:  11     This SmartLink is deprecated. Use AVSMEDLIST instead to display the medication list for a patient.   Orders Placed This Encounter  Procedures  . POCT glycosylated hemoglobin (Hb A1C)     Return for Fasting blood work 5.5 mo, then ov with me 1-2  wk later.  The patient was counseled, risk factors were discussed, anticipatory guidance given.  Gross side effects, risk and benefits, and alternatives of medications discussed with patient.  Patient is aware that all medications have potential side effects and we are unable to predict every side effect or drug-drug interaction that may occur.  Expresses verbal understanding and consents to current therapy plan and treatment regimen.  Please see AVS handed out to patient at the end of our visit for further patient instructions/ counseling done pertaining to today's office visit.    Note: This document was prepared using  Dragon voice recognition software and may include unintentional dictation errors.     Subjective:    Chief Complaint  Patient presents with  . Hypertension  . Hyperglycemia    HPI: Brittany Harvey is a 71 y.o. female who presents to [place] at [facility] today for follow up for HTN.     Pre-DM: - FBS in 70's-90's - 1 yr ago 5.8--- now 5.6.     Vertigo:  Gets sx occ in spring- had one episode recently and if she looks strait ahead and focuses on vision- sx resolved quickly.    Has had a little RN/ congestion/ mild ha and allergies are flaired a little. Not taking anything for them regulalry.   HTN:  -  Her blood pressure has been controlled at home.  Running at home--> 125/70, 120/80.  Seen by Dr Gwenlyn Found on torpol--> takes 1 tab q am and now 1/2 tab q pm - good control and feels well.   - Patient reports good compliance with blood pressure medications  - Denies medication S-E   - Smoking Status noted   - She denies new onset of: chest pain, exercise intolerance, shortness of breath, dizziness, visual changes, headache, lower extremity swelling or claudication.   Today their BP is BP: 138/72   Last 3 blood pressure readings in our office are as follows: BP Readings from Last 3 Encounters:  11/10/16 139/76  10/29/16 126/68  09/16/16 (!) 168/82    Pulse Readings from Last 3 Encounters:  11/10/16 (!) 59  10/29/16 66  09/16/16 (!) 58    Filed Weights   06/10/16 1536  Weight: 150 lb 4.8 oz (68.2 kg)      Patient Care Team    Relationship Specialty Notifications Start End  Mellody Dance, DO PCP - General Family Medicine  08/19/15   Juanita Craver, MD Consulting Physician Gastroenterology  09/24/15   Lorretta Harp, MD Consulting Physician Cardiology  09/24/15   Everlene Farrier, MD Consulting Physician Obstetrics and Gynecology  09/24/15    Comment: Juanda Chance, NP     Lab Results  Component Value Date   CREATININE 0.76 08/14/2016   BUN  15 08/14/2016   NA 144 08/14/2016   K 4.2 08/14/2016   CL 107 (H) 08/14/2016   CO2 21 08/14/2016    Lab Results  Component Value Date   CHOL 210 (H) 08/14/2016   CHOL 218 (A) 05/09/2015   CHOL 194 04/26/2013    Lab Results  Component Value Date   HDL 93 08/14/2016   HDL 90 (A) 05/09/2015   HDL 89 04/26/2013    Lab Results  Component Value Date   LDLCALC 98 08/14/2016   LDLCALC 113 05/09/2015   LDLCALC 92 04/26/2013    Lab Results  Component Value Date   TRIG 93 08/14/2016   TRIG 74 05/09/2015   TRIG 66 04/26/2013    Lab Results  Component Value Date   CHOLHDL 2.3 08/14/2016   CHOLHDL 2.2 04/26/2013    No results found for: LDLDIRECT ===================================================================   Patient Active Problem List   Diagnosis Date Noted  . Prediabetes 09/24/2015    Priority: High  . Fatigue 09/24/2015    Priority: High  . h/o HLD (hyperlipidemia) 08/20/2015    Priority: High  . Essential hypertension 07/24/2014    Priority: High  . PSVT (paroxysmal supraventricular tachycardia) ([place]) 03/27/2013    Priority: High  . Elevated HDL = 90 09/24/2015    Priority: Medium  . GERD (gastroesophageal reflux disease) 11/27/2013    Priority: Medium  . Vitamin D deficiency 11/10/2016    Priority: Low  . Environmental and seasonal allergies 06/10/2016    Priority: Low  . Overweight (BMI 25.0-29.9) 09/24/2015    Priority: Low  . Osteopenia by Dexa (8-9 yrs ago)  08/20/2015    Priority: Low  . Stressful job 08/12/2016  . Tiredness 08/12/2016  . Nonintractable headache 08/12/2016  . Other acute fatigue 08/12/2016  . Benign positional vertigo, bilateral 06/10/2016  . Upper back strain 05/07/2016  . Cough 03/06/2016     Past Medical History:  Diagnosis Date  . Anxiety   . Atypical chest pain   . GERD (gastroesophageal reflux disease)   . Heart palpitations   . Hypertension   . Kidney stones   . PSVT (paroxysmal supraventricular  tachycardia) ([place])      Past Surgical History:  Procedure Laterality Date  . CARDIOVASCULAR STRESS TEST  06/19/2009   Post stress myocardial perfusion images show a normal pattern of perfusion in all regions. ECG positive for ischemia. Appears to be a "false-positive" ECG stress test.  . TUBAL LIGATION       Family History  Adopted: Yes     Social History   Substance and Sexual Activity  Drug Use No  ,  Social History   Substance and Sexual Activity  Alcohol Use No  ,  Social History   Tobacco Use  Smoking Status Former Smoker  . Last attempt to quit: 01/27/1992  . Years since quitting: 24.8  Smokeless Tobacco Never Used  ,    Current Outpatient Medications on File Prior to Visit  Medication Sig Dispense Refill  . acetaminophen (TYLENOL) 500 MG tablet Take 500 mg by mouth daily as needed (pain).    [name] aspirin EC 81 MG tablet Take 81 mg by mouth daily.    . Calcium Citrate-Vitamin D (CALCIUM CITRATE + D PO) Take 1 tablet by mouth daily.    . Cholecalciferol (VITAMIN D3) 1000 units CAPS Take 5 capsules by mouth daily. Told pt to increase to 5,000 IU per day    . Cranberry 1000 MG CAPS Take 1 capsule by mouth daily.    . Multiple Vitamin (MULTIVITAMIN WITH MINERALS) TABS tablet Take 1 tablet by mouth daily. Centrum Silver    . triamcinolone cream (KENALOG) 0.1 % Apply 1 application topically daily as needed (itching/eczema).   3  . Omega-3 Fatty Acids (FISH OIL) 1000 MG CAPS Take 1 capsule by mouth daily.     No current facility-administered medications on file prior to visit.      Allergies  Allergen Reactions  . Penicillins Anaphylaxis and Swelling  . Tussin Dm [Guaifenesin-Dm] Other (See Comments)    Dizziness/ Lightheadness  . Zithromax [Azithromycin] Other (See Comments)    Pre-syncope     Review of Systems:   General:  Denies fever, chills Optho/Auditory:   Denies visual changes, blurred vision Respiratory:   Denies SOB, cough, wheeze, DIB    Cardiovascular:   Denies chest pain, palpitations, painful respirations Gastrointestinal:   Denies nausea, vomiting, diarrhea.  Endocrine:     Denies new hot or cold intolerance Musculoskeletal:  Denies joint swelling, gait issues, or new unexplained myalgias/ arthralgias Skin:  Denies rash, suspicious lesions  Neurological:    Denies dizziness, unexplained weakness, numbness  Psychiatric/Behavioral:   Denies mood changes  Objective:    Blood pressure 138/72, pulse 61, height 5\' 3"  (1.6 m), weight 150 lb 4.8 oz (68.2 kg).  Body mass index is 26.62 kg/m.  General: Well Developed, well nourished, and in no acute distress.  HEENT: Normocephalic, atraumatic, pupils equal round reactive to light, neck supple, No carotid bruits, no JVD Skin: Warm and dry, cap RF less 2 sec Cardiac: Regular rate and rhythm, S1, S2 WNL's, no murmurs rubs or gallops Respiratory: ECTA B/L, Not using accessory muscles, speaking in full sentences. NeuroM-Sk: Ambulates w/o assistance, moves ext * 4 w/o difficulty, sensation grossly intact.  Ext: scant edema b/l lower ext Psych: No HI/SI, judgement and insight good, Euthymic mood. Full Affect.

## 2016-06-10 NOTE — Assessment & Plan Note (Signed)
occ gets vertigo- usually in spring.

## 2016-07-24 ENCOUNTER — Telehealth: Payer: Self-pay | Admitting: Family Medicine

## 2016-07-27 ENCOUNTER — Other Ambulatory Visit: Payer: Self-pay | Admitting: Cardiovascular Disease

## 2016-07-27 MED ORDER — METOPROLOL SUCCINATE ER 25 MG PO TB24: 37.5000 mg | ORAL_TABLET | Freq: Every day | ORAL | 0 refills | Status: DC

## 2016-07-27 NOTE — Telephone Encounter (Signed)
Rx(s) sent to pharmacy electronically.  

## 2016-07-27 NOTE — Telephone Encounter (Signed)
°*  STAT* If patient is at the pharmacy, call can be transferred to refill team.   1. Which medications need to be refilled? (please list name of each medication and dose if known)Metoprolol ER Succ 25mg    2. Which pharmacy/location (including street and city if local pharmacy) is medication to be sent to?[facility] pharmacy  Fax  [phone]  3. Do they need a 30 day or 90 day supply?90  Needs a new prescription sent in

## 2016-08-12 ENCOUNTER — Encounter: Payer: Self-pay | Admitting: Family Medicine

## 2016-08-12 ENCOUNTER — Ambulatory Visit: Payer: Medicare Other | Admitting: Family Medicine

## 2016-08-12 VITALS — BP 148/84 | HR 69 | Temp 98.1°F | Ht 63.0 in | Wt 147.0 lb

## 2016-08-12 DIAGNOSIS — Z1389 Encounter for screening for other disorder: Secondary | ICD-10-CM

## 2016-08-12 DIAGNOSIS — R51 Headache: Secondary | ICD-10-CM

## 2016-08-12 DIAGNOSIS — Z114 Encounter for screening for human immunodeficiency virus [HIV]: Secondary | ICD-10-CM

## 2016-08-12 DIAGNOSIS — Z566 Other physical and mental strain related to work: Secondary | ICD-10-CM

## 2016-08-12 DIAGNOSIS — I471 Supraventricular tachycardia, unspecified: Secondary | ICD-10-CM

## 2016-08-12 DIAGNOSIS — E785 Hyperlipidemia, unspecified: Secondary | ICD-10-CM

## 2016-08-12 DIAGNOSIS — R5383 Other fatigue: Secondary | ICD-10-CM

## 2016-08-12 DIAGNOSIS — R7303 Prediabetes: Secondary | ICD-10-CM

## 2016-08-12 DIAGNOSIS — R519 Headache, unspecified: Secondary | ICD-10-CM

## 2016-08-12 DIAGNOSIS — Z719 Counseling, unspecified: Secondary | ICD-10-CM

## 2016-08-12 DIAGNOSIS — I10 Essential (primary) hypertension: Secondary | ICD-10-CM

## 2016-08-12 DIAGNOSIS — Z8639 Personal history of other endocrine, nutritional and metabolic disease: Secondary | ICD-10-CM

## 2016-08-12 NOTE — Patient Instructions (Addendum)
Please come in in the near future for fasting blood work sooner than usual so we can see if there is any reason for your fatigue more so than usual.  - If there are abnormalities, we will have you come in sooner than planned to discuss these otherwise please let us know if any new symptoms develop.  As I mentioned reasons for these overt symptoms could be obstructive sleep apnea and/or mood disorder secondary to your increased stress.  Please let me know if u would like to delve into these further or if you feel they become a problem.  Follow up with your cardiologist for your upcoming appointment as Artie scheduled in a week or so  What is Chronic Stress Syndrome, Symptoms & Ways to Deal With it   What is Chronic Stress Syndrome?  Chronic Stress Syndrome is something which can now be called as a medical condition due to the amount of stress an individual is going through these days. Chronic Stress Syndrome causes the body and mind to shutdown and the person has no control over himself or herself. Due to the demands of modern day life and the hardship throughout day and night takes its toll over a period of time and the body and brain starts demanding rest and a break. This leads to certain symptoms where your performance level starts to dip at work, you become irritable both at work and at home, you may stop enjoying activities you previously liked, you may become depressed, you may get angry for even small things. Chronic Stress Syndrome can significantly impact your quality life. Thus it is important understand the symptoms of Chronic Stress Syndrome and react accordingly in order to cope up with it.  It is important to note here that a balanced work-home equation should be drawn to cut down symptoms of Chronic Stress Syndrome. Minor stressors can be overcome by the body's inbuilt stress response but when there is unending stress for a long period of time then an external help is required to ease  the stress.  Chronic Stress Syndrome can physically and psychologically drain you over a period of time. For such cases stress management is the best way to cope up with Chronic Stress Syndrome. If Chronic Stress Syndrome is not treated then it may result in many health hazards like anxiety, muscle pain, insomnia, and high blood pressure along with a compromised immune system leading to frequent infections and missed days from work.    What are the Symptoms of Chronic Stress Syndrome?   The symptoms of Chronic Stress Syndrome are variable and range from generalized symptoms to emotional symptoms along with behavioral and cognitive symptoms. Some of these symptoms have been delineated below:  Generalized Symptoms of Chronic Stress Syndrome are: Anxiety Depression Social isolation Headache Abdominal pain Lack of sleep Back pain Difficulty in concentrating Hypertension Hemorrhoids Varicose veins Panic attacks/ Panic disorder Cardiovascular diseases.   Some of the Emotional Symptoms of Chronic Stress Syndrome are: To become easily agitated, moody and frustrated Feeling overwhelmed which makes you feel like you are losing control. Having difficulty relaxing and have a peaceful mind Having low self esteem Feeling lonely Feeling worthless Feeling depressed Avoiding social environment.   Some of the Physical Symptoms of Chronic Stress Syndrome are: Headaches Lethargy Alternating diarrhea and constipation Nausea Muscles aches and pains Insomnia Rapid heartbeat and chest pain Infections and frequent colds Decreased libido Nervousness and shaking Tinnitus Sweaty palms Dry mouth Clenched jaw.  Some of the Cognitive Symptoms  of Chronic Stress Syndrome are: Constant worrying Racing thoughts Disorganization and forgetfulness Inability to focus Poor judgment Abundance of negativity.  Some of the Behavioral Symptoms of Chronic Stress Syndrome are: Changes in appetite  with less desire to eat Avoiding responsibilities Indulgence in alcohol or recreational drug use Increased nail biting and being fidgety Ways to Deal With Chronic Stress Syndrome    Chronic Stress Syndrome is not something which cannot be addressed. A bit of effort from your side in the form of lifestyle modifications, a little bit of exercise, a balanced work life equation can do wonders and help you get rid of Chronic Stress Syndrome.  Get Proper Sleep: It has been proved that Chronic Stress Syndrome causes loss of sleep where an individual may not even be able to sleep for days unending. This may result in the individual feeling lethargic and unable to focus at work the following morning. This may lead to decreased performance at work. Thus, it is important to have a good sleep-wake cycle. For this, try and not drink any caffeinated beverage about four hours prior to going to sleep, as caffeine pumps up the adrenaline and causes you to stay awake resulting ultimately in Chronic Stress Syndrome.  Avoid Alcohol and Drugs: Another way to get rid of Chronic Stress Syndrome is lifestyle modifications. Stay away from alcohol and other recreational drugs. Take Short Frequent Breaks at Work: Try to take frequent breaks from work and do not work continuously. Try and manage your work in such a way that you even meet your deadline and come home on time for a happy dinner with family. A good time spent with family and kids does wonders in not only dealing with Chronic Stress Syndrome but also preventing it.  Become Physically Active: Another step towards getting rid of Chronic Stress Syndrome is physical activity. If you do not have time to spend at the gym then at least try and go for daily walks for about half an hour a day which not only keeps the stress away but also is good for your overall health. Physical activity leads to production of endorphins which will make you feel relaxed and feel  good.  Healthy Diet Can Help You Deal With Chronic Stress Syndrome: Have a balanced and healthy diet is another step towards a stress free life and keeping Chronic Stress Syndrome at [place]. If time is a constraint then you can try eating three small meals a day. Try and avoid fast foods and take foods which are healthy and rich in proteins, fiber, and carbohydrates to boost your energy system.  Music Can Soothe Your Mind: Light music is one of the best and most effective relaxation techniques that one can try to overcome stress. It has shown to calm down the mind and take you away from all the stressors that you may be having. These days it is also being used as a therapy in some institutes for overcoming stress. It is important here to discuss the importance of a good social support system for patients with Chronic Stress Syndrome, as a good social support framework can do wonders in taking the stress away from the patient and overcoming Chronic Stress Syndrome.  Meditation Can Help You Deal With Chronic Stress Syndrome Effectively: Meditation and yoga has also shown to be quite effective in relaxing the mind and coping up with Chronic Stress Syndrome   In cases where these measures are not helpful, then it is time for you to consult with  a skilled psychologist or a psychiatrist for potential therapies or medications to control the stress response.   The psychologist can help you with a variety of steps for coping up with Chronic Stress Syndrome. Relaxation techniques and behavioral therapy are some of the methods employed by psychologists. In some cases, medications can also be given to help relax the patient.  Since Chronic Stress Syndrome is both emotionally and physically draining for the patient and it also adversely affects the family life of the patient hence it is important for the patient to recognize the condition and taking steps to cope up with it. Escaping measures like alcohol and drug use  are of no help as they only aggravate the condition apart from their other health hazards. If this condition is ignored or left untreated it can lead to various medical conditions like anxiety and depression and various other medical conditions.  Last but not least, smile as often as you can as it is the best gift that you can give to someone. The best way to stay relaxed is to have a good smile, exercise daily, spend time with your family, meditation and if required consultation with a good psychologist so that you can live a stress free life and overcome the symptoms of Chronic Stress Syndrome.

## 2016-08-12 NOTE — Progress Notes (Signed)
Pt here for an acute care OV today   Impression and Recommendations:    1. Other acute fatigue   2. Nonintractable headache, unspecified chronicity pattern, unspecified headache type   3. Tiredness   4. Stressful job   5. Essential hypertension   6. Hyperlipidemia, unspecified hyperlipidemia type   7. Prediabetes   8. PSVT (paroxysmal supraventricular tachycardia) ([place])   9. Screening for multiple conditions   10. Health education/counseling   11. History of vitamin D deficiency   12. Screening for HIV (human immunodeficiency virus)    - Reassuring examination explained to patient there is no one condition I can attribute her current symptoms to at this time. As I mentioned reasons for these overt symptoms could be obstructive sleep apnea and/or mood disorder secondary to your increased stress.  Please let me know if u would like to delve into these further or if you feel they become a problem.    Patient's PHQ and GAD today were ess normal today.  She declines any medications or any referral for sleep study etc today.  Please come in in the near future for fasting blood work sooner than usual so we can see if there is any reason for your fatigue more so than usual.  - If there are abnormalities, we will have you come in sooner than planned to discuss these otherwise please let us know if any new symptoms develop.   Follow up with your cardiologist for your upcoming appointment as Artie scheduled in a week or so  The patient was counseled, risk factors were discussed, anticipatory guidance given.   Orders Placed This Encounter  Procedures  . Comprehensive metabolic panel  . RPR  . Sedimentation rate  . CBC  . TSH  . Folate  . Lipid panel  . Magnesium  . Phosphorus  . VITAMIN D 25 Hydroxy (Vit-D Deficiency, Fractures)  . Vitamin B12  . TSH  . T4, free  . HIV antibody (with reflex)    Please see AVS handed out to patient at the end of our visit for further  patient instructions/ counseling done pertaining to today's office visit.   Return if symptoms worsen or fail to improve, for F-up of current med issues as previously d/c pt.     Note: This document was prepared using Dragon voice recognition software and may include unintentional dictation errors.  Mellody Dance 2:34 PM --------------------------------------------------------------------------------------------------------------------------------------------------------------------------------------------------------------------------------------------    Subjective:    CC:  Chief Complaint  Patient presents with  . Fatigue    fatigue, headaches, difficulty breathing when lays down at night x 1 wk    HPI: Brittany Harvey is a 71 y.o. female who presents to [place] at [facility] today for issues as discussed below.  Sx for 1 wk, HA's, really tired, (no reason for it), goes to bed and doesn't feel rested when awakens.  The headaches come and go and they're about a 2-3 out of 10.   2 nights it was hot in her home and the air conditioning was turned down too low and she woke up feeling short of breath like she couldn't catch her breath due to the heat and humidity.  Otherwise she denies any chest pain, orthopnea/ PND or dyspnea on exertion more than her usual.  She has the same exercise tolerance that she had prior.  She denies any new onset palpitations or swelling in her legs.  This is been going on for proximally one  week.  She denies fever or chills, she denies chest congestion, shortness of breath and wheeze.  She denies any head cold or congestion.  She denies any dizziness or new neurological symptoms.      - BP at home 101/71 and BS 102 this am.  Otherwise she's been feeling well.  - When asked she says that life has been a little more stressful lately as they are low on the patient volume and she's not sure if she will have a job next week.  She does think that  this perhaps is contracting to her symptoms.  She denies depression or anxiety.  No flowsheet data found.  Depression screen [facility] 2/9 08/20/2015  Decreased Interest 0  Down, Depressed, Hopeless 0  PHQ - 2 Score 0     Problem  Stressful Job  Tiredness  Nonintractable Headache  Other acute fatigue     Wt Readings from Last 3 Encounters:  08/12/16 147 lb (66.7 kg)  06/10/16 150 lb 4.8 oz (68.2 kg)  05/07/16 150 lb (68 kg)   BP Readings from Last 3 Encounters:  08/12/16 (!) 148/84  06/10/16 138/72  05/07/16 (!) 153/72   BMI Readings from Last 3 Encounters:  08/12/16 26.04 kg/m  06/10/16 26.62 kg/m  05/07/16 26.57 kg/m     Patient Care Team    Relationship Specialty Notifications Start End  Mellody Dance, DO PCP - General Family Medicine  08/19/15   Juanita Craver, MD Consulting Physician Gastroenterology  09/24/15   Lorretta Harp, MD Consulting Physician Cardiology  09/24/15   Everlene Farrier, MD Consulting Physician Obstetrics and Gynecology  09/24/15    Comment: Juanda Chance, NP     Patient Active Problem List   Diagnosis Date Noted  . Stressful job 08/12/2016  . Tiredness 08/12/2016  . Nonintractable headache 08/12/2016  . Other acute fatigue 08/12/2016  . Benign positional vertigo, bilateral 06/10/2016  . Environmental and seasonal allergies 06/10/2016  . Upper back strain 05/07/2016  . Cough 03/06/2016  . Prediabetes 09/24/2015  . Overweight (BMI 25.0-29.9) 09/24/2015  . Elevated HDL = 90 09/24/2015  . Fatigue 09/24/2015  . Osteopenia by Dexa (8-9 yrs ago)  08/20/2015  . h/o HLD (hyperlipidemia) 08/20/2015  . Essential hypertension 07/24/2014  . GERD (gastroesophageal reflux disease) 11/27/2013  . PSVT (paroxysmal supraventricular tachycardia) ([place]) 03/27/2013    Past Medical history, Surgical history, Family history, Social history, Allergies and Medications have been entered into the medical record, reviewed and changed as needed.    Current  Meds  Medication Sig  . acetaminophen (TYLENOL) 500 MG tablet Take 500 mg by mouth daily as needed (pain).  [name] aspirin EC 81 MG tablet Take 81 mg by mouth daily.  . Calcium Citrate-Vitamin D (CALCIUM CITRATE + D PO) Take 1 tablet by mouth daily.  . Cholecalciferol (VITAMIN D3) 1000 units CAPS Take 5 capsules by mouth daily. Told pt to increase to 5,000 IU per day  . Cranberry 1000 MG CAPS Take 1 capsule by mouth daily.  . fluticasone (FLONASE) 50 MCG/ACT nasal spray Place 1 spray into both nostrils 2 (two) times daily.  [name] loratadine (CLARITIN) 10 MG tablet Take 1 tablet (10 mg total) by mouth daily.  . metoprolol succinate (TOPROL-XL) 25 MG 24 hr tablet Take 1.5 tablets (37.5 mg total) by mouth daily.  . Multiple Vitamin (MULTIVITAMIN WITH MINERALS) TABS tablet Take 1 tablet by mouth daily. Centrum Silver  . Omega-3 Fatty Acids (FISH OIL) 1000 MG CAPS Take 1 capsule by  mouth daily.  [name] triamcinolone cream (KENALOG) 0.1 % Apply 1 application topically daily as needed (itching/eczema).     Allergies:  Allergies  Allergen Reactions  . Penicillins Anaphylaxis and Swelling  . Tussin Dm [Guaifenesin-Dm] Other (See Comments)    Dizziness/ Lightheadness  . Zithromax [Azithromycin] Other (See Comments)    Pre-syncope     Review of Systems: General:   Denies fever, chills, unexplained weight loss.  Optho/Auditory:   Denies visual changes, blurred vision/LOV Respiratory:   Denies wheeze, DOE more than baseline levels.  Cardiovascular:   Denies chest pain, palpitations, new onset peripheral edema  Gastrointestinal:   Denies nausea, vomiting, diarrhea, abd pain.  Genitourinary: Denies dysuria, freq/ urgency, flank pain or discharge from genitals.  Endocrine:     Denies hot or cold intolerance, polyuria, polydipsia. Musculoskeletal:   Denies unexplained myalgias, joint swelling, unexplained arthralgias, gait problems.  Skin:  Denies new onset rash, suspicious lesions Neurological:     Denies  dizziness, unexplained weakness, numbness  Psychiatric/Behavioral:   Denies mood changes, suicidal or homicidal ideations, hallucinations    Objective:   Blood pressure (!) 148/84, pulse 69, temperature 98.1 F (36.7 C), height 5\' 3"  (1.6 m), weight 147 lb (66.7 kg), SpO2 96 %. Body mass index is 26.04 kg/m. General:  Well Developed, well nourished, appropriate for stated age.  Neuro:  Alert and oriented,  extra-ocular muscles intact  HEENT:  Normocephalic, atraumatic, neck supple, no carotid bruits. Skin:  no gross rash, warm, pink. Cardiac:  RRR, S1 S2 Respiratory:  ECTA B/L and A/P, Not using accessory muscles, speaking in full sentences- unlabored. Vascular:  Ext warm, no cyanosis apprec.; cap RF less 2 sec. Psych:  No HI/SI, judgement and insight good, Euthymic mood. Full Affect.

## 2016-08-14 ENCOUNTER — Other Ambulatory Visit (INDEPENDENT_AMBULATORY_CARE_PROVIDER_SITE_OTHER): Payer: Medicare Other

## 2016-08-14 DIAGNOSIS — I471 Supraventricular tachycardia, unspecified: Secondary | ICD-10-CM

## 2016-08-14 DIAGNOSIS — I10 Essential (primary) hypertension: Secondary | ICD-10-CM

## 2016-08-14 DIAGNOSIS — E785 Hyperlipidemia, unspecified: Secondary | ICD-10-CM

## 2016-08-14 DIAGNOSIS — Z566 Other physical and mental strain related to work: Secondary | ICD-10-CM

## 2016-08-14 DIAGNOSIS — R519 Headache, unspecified: Secondary | ICD-10-CM

## 2016-08-14 DIAGNOSIS — R5383 Other fatigue: Secondary | ICD-10-CM

## 2016-08-14 DIAGNOSIS — Z87898 Personal history of other specified conditions: Secondary | ICD-10-CM | POA: Diagnosis not present

## 2016-08-14 DIAGNOSIS — Z8639 Personal history of other endocrine, nutritional and metabolic disease: Secondary | ICD-10-CM | POA: Diagnosis not present

## 2016-08-14 DIAGNOSIS — R51 Headache: Secondary | ICD-10-CM | POA: Diagnosis not present

## 2016-08-14 DIAGNOSIS — R5382 Chronic fatigue, unspecified: Secondary | ICD-10-CM | POA: Diagnosis not present

## 2016-08-14 DIAGNOSIS — E559 Vitamin D deficiency, unspecified: Secondary | ICD-10-CM | POA: Diagnosis not present

## 2016-08-15 LAB — CBC
Hematocrit: 38.7 % (ref 34.0–46.6)
Hemoglobin: 13.5 g/dL (ref 11.1–15.9)
MCH: 30.8 pg (ref 26.6–33.0)
MCHC: 34.9 g/dL (ref 31.5–35.7)
MCV: 88 fL (ref 79–97)
Platelets: 264 10*3/uL (ref 150–379)
RBC: 4.39 x10E6/uL (ref 3.77–5.28)
RDW: 13.8 % (ref 12.3–15.4)
WBC: 6.2 10*3/uL (ref 3.4–10.8)

## 2016-08-15 LAB — COMPREHENSIVE METABOLIC PANEL
ALT: 17 IU/L (ref 0–32)
AST: 20 IU/L (ref 0–40)
Albumin/Globulin Ratio: 1.5 (ref 1.2–2.2)
Albumin: 4.4 g/dL (ref 3.5–4.8)
Alkaline Phosphatase: 63 IU/L (ref 39–117)
BUN/Creatinine Ratio: 20 (ref 12–28)
BUN: 15 mg/dL (ref 8–27)
Bilirubin Total: 0.7 mg/dL (ref 0.0–1.2)
CO2: 21 mmol/L (ref 20–29)
Calcium: 9.4 mg/dL (ref 8.7–10.3)
Chloride: 107 mmol/L — ABNORMAL HIGH (ref 96–106)
Creatinine, Ser: 0.76 mg/dL (ref 0.57–1.00)
GFR calc Af Amer: 92 mL/min/{1.73_m2} (ref >59)
GFR calc non Af Amer: 80 mL/min/{1.73_m2} (ref >59)
Globulin, Total: 2.9 g/dL (ref 1.5–4.5)
Glucose: 96 mg/dL (ref 65–99)
Potassium: 4.2 mmol/L (ref 3.5–5.2)
Sodium: 144 mmol/L (ref 134–144)
Total Protein: 7.3 g/dL (ref 6.0–8.5)

## 2016-08-15 LAB — RPR: RPR Ser Ql: NONREACTIVE

## 2016-08-15 LAB — TSH: TSH: 1.62 u[IU]/mL (ref 0.450–4.500)

## 2016-08-15 LAB — MAGNESIUM: Magnesium: 1.9 mg/dL (ref 1.6–2.3)

## 2016-08-15 LAB — LIPID PANEL
Chol/HDL Ratio: 2.3 ratio (ref 0.0–4.4)
Cholesterol, Total: 210 mg/dL — ABNORMAL HIGH (ref 100–199)
HDL: 93 mg/dL (ref >39)
LDL Calculated: 98 mg/dL (ref 0–99)
Triglycerides: 93 mg/dL (ref 0–149)
VLDL Cholesterol Cal: 19 mg/dL (ref 5–40)

## 2016-08-15 LAB — VITAMIN B12: Vitamin B-12: 1007 pg/mL (ref 232–1245)

## 2016-08-15 LAB — PHOSPHORUS: Phosphorus: 2.9 mg/dL (ref 2.5–4.5)

## 2016-08-15 LAB — SEDIMENTATION RATE: Sed Rate: 9 mm/hr (ref 0–40)

## 2016-08-15 LAB — FOLATE: Folate: >20 ng/mL (ref >3.0)

## 2016-08-15 LAB — T4, FREE: Free T4: 1.18 ng/dL (ref 0.82–1.77)

## 2016-08-15 LAB — VITAMIN D 25 HYDROXY (VIT D DEFICIENCY, FRACTURES): Vit D, 25-Hydroxy: 50.3 ng/mL (ref 30.0–100.0)

## 2016-09-16 ENCOUNTER — Encounter: Payer: Self-pay | Admitting: Cardiovascular Disease

## 2016-09-16 ENCOUNTER — Ambulatory Visit (INDEPENDENT_AMBULATORY_CARE_PROVIDER_SITE_OTHER): Payer: Medicare Other | Admitting: Cardiovascular Disease

## 2016-09-16 DIAGNOSIS — E7800 Pure hypercholesterolemia, unspecified: Secondary | ICD-10-CM

## 2016-09-16 DIAGNOSIS — I10 Essential (primary) hypertension: Secondary | ICD-10-CM

## 2016-09-16 DIAGNOSIS — I471 Supraventricular tachycardia: Secondary | ICD-10-CM | POA: Diagnosis not present

## 2016-09-16 NOTE — Assessment & Plan Note (Signed)
History of hyperlipidemia not on statin therapy with recent lipid profile performed 08/14/16 revealed an LDL of 98 and HDL of 93.

## 2016-09-16 NOTE — Assessment & Plan Note (Signed)
History of essential hypertension with blood pressure measured today at 168/82. She is on metoprolol. Continue current meds at current dosing

## 2016-09-16 NOTE — Progress Notes (Signed)
09/16/2016 Brittany Harvey   [birth year]  [id number]  Primary Physician Mellody Dance, DO Primary Cardiologist: Lorretta Harp MD Garret Reddish, [place], [place]  HPI:  Brittany Harvey is a 71 y.o. female  moderately overweight widowed Caucasian female who I last saw A/4/17. She saw Dr. Sallyanne Kuster back in 2011.[name] She has a history of PSVT remotely back in 2004. She had a false positive GXT back in 2011 which led to a Myoview which was normal. She was admitted to the hospital back in March after I saw her in the office for chest pain/rule out myocardial infarction. A stress MR I was completely normal. She said since and no recurrent symptoms. The presumed etiology of his of her chest pain was reflux. She saw [name] in the office 11/27/13 after she had been seen at [facility] emergency room for PSVT requiring adenosine conversion. She's had no recurrent symptoms.   Current Meds  Medication Sig  . acetaminophen (TYLENOL) 500 MG tablet Take 500 mg by mouth daily as needed (pain).  [name] aspirin EC 81 MG tablet Take 81 mg by mouth daily.  . Calcium Citrate-Vitamin D (CALCIUM CITRATE + D PO) Take 1 tablet by mouth daily.  . Cholecalciferol (VITAMIN D3) 1000 units CAPS Take 5 capsules by mouth daily. Told pt to increase to 5,000 IU per day  . Cranberry 1000 MG CAPS Take 1 capsule by mouth daily.  . fluticasone (FLONASE) 50 MCG/ACT nasal spray Place 1 spray into both nostrils 2 (two) times daily.  [name] loratadine (CLARITIN) 10 MG tablet Take 1 tablet (10 mg total) by mouth daily.  . metoprolol succinate (TOPROL-XL) 25 MG 24 hr tablet Take 1.5 tablets (37.5 mg total) by mouth daily.  . Multiple Vitamin (MULTIVITAMIN WITH MINERALS) TABS tablet Take 1 tablet by mouth daily. Centrum Silver  . Omega-3 Fatty Acids (FISH OIL) 1000 MG CAPS Take 1 capsule by mouth daily.  [name] triamcinolone cream (KENALOG) 0.1 % Apply 1 application topically daily as needed (itching/eczema).      Allergies  Allergen Reactions    . Penicillins Anaphylaxis and Swelling  . Tussin Dm [Guaifenesin-Dm] Other (See Comments)    Dizziness/ Lightheadness  . Zithromax [Azithromycin] Other (See Comments)    Pre-syncope    Social History   Social History  . Marital status: Married    Spouse name: [name]  . Number of children: [name]  . Years of education: [name]   Occupational History  . Not on file.   Social History Main Topics  . Smoking status: Former Smoker    Quit date: 01/27/1992  . Smokeless tobacco: Never Used  . Alcohol use No  . Drug use: No  . Sexual activity: No   Other Topics Concern  . Not on file   Social History Narrative  . No narrative on file     Review of Systems: General: negative for chills, fever, night sweats or weight changes.  Cardiovascular: negative for chest pain, dyspnea on exertion, edema, orthopnea, palpitations, paroxysmal nocturnal dyspnea or shortness of breath Dermatological: negative for rash Respiratory: negative for cough or wheezing Urologic: negative for hematuria Abdominal: negative for nausea, vomiting, diarrhea, bright red blood per rectum, melena, or hematemesis Neurologic: negative for visual changes, syncope, or dizziness All other systems reviewed and are otherwise negative except as noted above.    Blood pressure (!) 168/82, pulse (!) 58, height 5\' 1"  (1.549 m), weight 147 lb (66.7 kg).  General appearance: alert and no  distress Neck: no adenopathy, no carotid bruit, no JVD, supple, symmetrical, trachea midline and thyroid not enlarged, symmetric, no tenderness/mass/nodules Lungs: clear to auscultation bilaterally Heart: regular rate and rhythm, S1, S2 normal, no murmur, click, rub or gallop Extremities: extremities normal, atraumatic, no cyanosis or edema  EKG sinus bradycardia 58 with ST or T-wave changes. I Personally reviewed his EKG.  ASSESSMENT AND PLAN:   PSVT (paroxysmal supraventricular tachycardia) ([facility]) History of PSVT in the past without  recurrence.  Essential hypertension History of essential hypertension with blood pressure measured today at 168/82. She is on metoprolol. Continue current meds at current dosing  h/o HLD (hyperlipidemia) History of hyperlipidemia not on statin therapy with recent lipid profile performed 08/14/16 revealed an LDL of 98 and HDL of 93.      Lorretta Harp MD FACP,FACC,FAHA, [facility] 09/16/2016 12:46 PM

## 2016-09-16 NOTE — Patient Instructions (Signed)

## 2016-09-16 NOTE — Assessment & Plan Note (Signed)
History of PSVT in the past without recurrence 

## 2016-10-29 ENCOUNTER — Ambulatory Visit (INDEPENDENT_AMBULATORY_CARE_PROVIDER_SITE_OTHER): Payer: Medicare Other

## 2016-10-29 VITALS — BP 126/68 | HR 66 | Temp 98.6°F

## 2016-10-29 DIAGNOSIS — Z23 Encounter for immunization: Secondary | ICD-10-CM | POA: Diagnosis not present

## 2016-11-10 ENCOUNTER — Encounter: Payer: Self-pay | Admitting: Family Medicine

## 2016-11-10 ENCOUNTER — Ambulatory Visit (INDEPENDENT_AMBULATORY_CARE_PROVIDER_SITE_OTHER): Payer: Medicare Other | Admitting: Family Medicine

## 2016-11-10 VITALS — BP 139/76 | HR 59 | Ht 61.0 in | Wt 146.6 lb

## 2016-11-10 DIAGNOSIS — E559 Vitamin D deficiency, unspecified: Secondary | ICD-10-CM | POA: Insufficient documentation

## 2016-11-10 DIAGNOSIS — R5382 Chronic fatigue, unspecified: Secondary | ICD-10-CM | POA: Diagnosis not present

## 2016-11-10 DIAGNOSIS — R7303 Prediabetes: Secondary | ICD-10-CM | POA: Diagnosis not present

## 2016-11-10 DIAGNOSIS — I471 Supraventricular tachycardia: Secondary | ICD-10-CM

## 2016-11-10 DIAGNOSIS — E663 Overweight: Secondary | ICD-10-CM | POA: Diagnosis not present

## 2016-11-10 DIAGNOSIS — E7889 Other lipoprotein metabolism disorders: Secondary | ICD-10-CM

## 2016-11-10 DIAGNOSIS — K219 Gastro-esophageal reflux disease without esophagitis: Secondary | ICD-10-CM | POA: Diagnosis not present

## 2016-11-10 DIAGNOSIS — J3089 Other allergic rhinitis: Secondary | ICD-10-CM | POA: Diagnosis not present

## 2016-11-10 DIAGNOSIS — E7800 Pure hypercholesterolemia, unspecified: Secondary | ICD-10-CM

## 2016-11-10 DIAGNOSIS — I10 Essential (primary) hypertension: Secondary | ICD-10-CM

## 2016-11-10 NOTE — Assessment & Plan Note (Signed)
Continue exercise habits

## 2016-11-10 NOTE — Assessment & Plan Note (Signed)
A1c 5.6 approximately 5 months ago.  Patient declines checking today.    Will check next office visit.

## 2016-11-10 NOTE — Assessment & Plan Note (Signed)
Blood pressure well controlled today.  Seen by cardiology managed with metoprolol for her PSVT as well as blood pressure. - Blood pressure pulse stable today.  No change

## 2016-11-10 NOTE — Patient Instructions (Addendum)
Use otc omeprazole and ranitidine- as needed for reflux.  Add TUMS if above medicines are not totally controlling your symptoms     If you have insomnia or difficulty sleeping, this information is for you:  - Avoid caffeinated beverages after lunch,  no alcoholic beverages,  no eating within 2-3 hours of lying down,  avoid exposure to blue light before bed,  avoid daytime naps, and  needs to maintain a regular sleep schedule- go to sleep and wake up around the same time every night.   - Resolve concerns or worries before entering bedroom:  Discussed relaxation techniques with patient and to keep a journal to write down fears\ worries.  I suggested seeing a counselor for CBT.   - Recommend patient meditate or do deep breathing exercises to help relax.   Incorporate the use of white noise machines or listen to "sleep meditation music", or recordings of guided meditations for sleep from YouTube which are free, such as  "guided meditation for detachment from over thinking"  by Mayford Knife.

## 2016-11-10 NOTE — Assessment & Plan Note (Addendum)
Pulse stable, patient without complaints today. - Treatment per cardiology.

## 2016-11-10 NOTE — Progress Notes (Signed)
Assessment and plan:  1. Essential hypertension   2. Prediabetes   3. Pure hypercholesterolemia   4. Vitamin D deficiency   5. Gastroesophageal reflux disease without esophagitis   6. Fatigue   7. Environmental and seasonal allergies   8. Elevated HDL = 90   9. Overweight (BMI 25.0-29.9)   10. PSVT (paroxysmal supraventricular tachycardia) (HCC)       GERD (gastroesophageal reflux disease) Use otc omeprazole and ranitidine - Symptoms well controlled at current  Essential hypertension Blood pressure well controlled today.  Seen by cardiology managed with metoprolol for her PSVT as well as blood pressure. - Blood pressure pulse stable today.  No change  Fatigue Labs stable note reason that is apparent. - Symptoms have improved and patient does not wish to pursue concerns for this currently. - She will follow-up if becomes a problem in the future.  h/o HLD (hyperlipidemia) LDL less than 100 and HDL is over 90.  This is fantastic. - Continue to exercise / moving daily and eat healthy as she has been  Prediabetes A1c 5.6 approximately 5 months ago.  Patient declines checking today.    Will check next office visit.  PSVT (paroxysmal supraventricular tachycardia) (HCC) Pulse stable, patient without complaints today. - Treatment per cardiology.  Elevated HDL = 90 Continue exercise habits    No orders of the defined types were placed in this encounter.   Discontinued Medications   No medications on file    Modified Medications   No medications on file     No orders of the defined types were placed in this encounter.    Return for 4 mo=- A1c, Bp, anxiety, .  Anticipatory guidance and routine counseling done re: condition, txmnt options and need for follow up. All questions of patient's were answered.   Gross side effects, risk and benefits, and alternatives of medications discussed with  patient.  Patient is aware that all medications have potential side effects and we are unable to predict every sideeffect or drug-drug interaction that may occur.  Expresses verbal understanding and consents to current therapy plan and treatment regiment.  Please see AVS handed out to patient at the end of our visit for additional patient instructions/ counseling done pertaining to today's office visit.  Note: This document was prepared using Dragon voice recognition software and may include unintentional dictation errors.   ----------------------------------------------------------------------------------------------------------------------  Subjective:   CC:   Brittany Harvey is a 71 y.o. female who presents to [place] [facility] today for review and discussion of recent bloodwork that was done.  1. All recent blood work that we ordered was reviewed with patient today.  Patient was counseled on all abnormalities and we discussed dietary and lifestyle changes that could help those values (also medications when appropriate).  Extensive health counseling performed and all patient's concerns/ questions were addressed.    2. We do not find any reason for her fatigue symptoms.  She tells me today her's fatigue symptoms are improved.  She still is not sleeping great but does not want to go for a sleep study.  She does say she at times gets short of breath when she's lying in bed and starts worrying about things.  Again, I have concerns or might be anxiety issue.  Patient declines medication or any further evaluation\treatment for it.  She does not think this is a concern for her.    Wt Readings from Last 3  Encounters:  11/10/16 146 lb 9.6 oz (66.5 kg)  09/16/16 147 lb (66.7 kg)  08/12/16 147 lb (66.7 kg)   BP Readings from Last 3 Encounters:  11/10/16 139/76  10/29/16 126/68  09/16/16 (!) 168/82   Pulse Readings from Last 3 Encounters:  11/10/16 (!) 59  10/29/16 66    09/16/16 (!) 58   BMI Readings from Last 3 Encounters:  11/10/16 27.70 kg/m  09/16/16 27.78 kg/m  08/12/16 26.04 kg/m     Patient Care Team    Relationship Specialty Notifications Start End  Mellody Dance, DO PCP - General Family Medicine  08/19/15   Juanita Craver, MD Consulting Physician Gastroenterology  09/24/15   Lorretta Harp, MD Consulting Physician Cardiology  09/24/15   Everlene Farrier, MD Consulting Physician Obstetrics and Gynecology  09/24/15    Comment: Juanda Chance, NP    Full medical history updated and reviewed in the office today  Patient Active Problem List   Diagnosis Date Noted  . Prediabetes 09/24/2015    Priority: High  . Fatigue 09/24/2015    Priority: High  . h/o HLD (hyperlipidemia) 08/20/2015    Priority: High  . Essential hypertension 07/24/2014    Priority: High  . PSVT (paroxysmal supraventricular tachycardia) ([place]) 03/27/2013    Priority: High  . Elevated HDL = 90 09/24/2015    Priority: Medium  . GERD (gastroesophageal reflux disease) 11/27/2013    Priority: Medium  . Vitamin D deficiency 11/10/2016    Priority: Low  . Environmental and seasonal allergies 06/10/2016    Priority: Low  . Overweight (BMI 25.0-29.9) 09/24/2015    Priority: Low  . Osteopenia by Dexa (8-9 yrs ago)  08/20/2015    Priority: Low  . Stressful job 08/12/2016  . Tiredness 08/12/2016  . Nonintractable headache 08/12/2016  . Other acute fatigue 08/12/2016  . Benign positional vertigo, bilateral 06/10/2016  . Upper back strain 05/07/2016  . Cough 03/06/2016    Past Medical History:  Diagnosis Date  . Anxiety   . Atypical chest pain   . GERD (gastroesophageal reflux disease)   . Heart palpitations   . Hypertension   . Kidney stones   . PSVT (paroxysmal supraventricular tachycardia) ([place])     Past Surgical History:  Procedure Laterality Date  . CARDIOVASCULAR STRESS TEST  06/19/2009   Post stress myocardial perfusion images show a normal pattern of  perfusion in all regions. ECG positive for ischemia. Appears to be a "false-positive" ECG stress test.  . TUBAL LIGATION      Social History  Substance Use Topics  . Smoking status: Former Smoker    Quit date: 01/27/1992  . Smokeless tobacco: Never Used  . Alcohol use No    Family Hx: Family History  Problem Relation Age of Onset  . Adopted: Yes     Medications: Current Outpatient Prescriptions  Medication Sig Dispense Refill  . acetaminophen (TYLENOL) 500 MG tablet Take 500 mg by mouth daily as needed (pain).    [name] aspirin EC 81 MG tablet Take 81 mg by mouth daily.    . Calcium Citrate-Vitamin D (CALCIUM CITRATE + D PO) Take 1 tablet by mouth daily.    . Cholecalciferol (VITAMIN D3) 1000 units CAPS Take 5 capsules by mouth daily. Told pt to increase to 5,000 IU per day    . Cranberry 1000 MG CAPS Take 1 capsule by mouth daily.    . fluticasone (FLONASE) 50 MCG/ACT nasal spray Place 1 spray into both nostrils 2 (two) times  daily. 16 g 6  . loratadine (CLARITIN) 10 MG tablet Take 1 tablet (10 mg total) by mouth daily. 30 tablet 11  . metoprolol succinate (TOPROL-XL) 25 MG 24 hr tablet Take 1.5 tablets (37.5 mg total) by mouth daily. 135 tablet 0  . Multiple Vitamin (MULTIVITAMIN WITH MINERALS) TABS tablet Take 1 tablet by mouth daily. Centrum Silver    . Omega-3 Fatty Acids (FISH OIL) 1000 MG CAPS Take 1 capsule by mouth daily.    [name] triamcinolone cream (KENALOG) 0.1 % Apply 1 application topically daily as needed (itching/eczema).   3   No current facility-administered medications for this visit.     Allergies:  Allergies  Allergen Reactions  . Penicillins Anaphylaxis and Swelling  . Tussin Dm [Guaifenesin-Dm] Other (See Comments)    Dizziness/ Lightheadness  . Zithromax [Azithromycin] Other (See Comments)    Pre-syncope     Review of Systems: General:   No F/C, wt loss Pulm:   No DIB, SOB, pleuritic chest pain Card:  No CP, palpitations Abd:  No n/v/d or pain Ext:   No inc edema from baseline  Objective:  Blood pressure 139/76, pulse (!) 59, height 5\' 1"  (1.549 m), weight 146 lb 9.6 oz (66.5 kg). Body mass index is 27.7 kg/m. Gen:   Well NAD, A and O *3 HEENT:    [place]/AT, EOMI,  MMM Lungs:   Normal work of breathing. CTA B/L, no Wh, rhonchi Heart:   RRR, S1, S2 WNL's, no MRG Abd:   No gross distention Exts:    warm, pink,  Brisk capillary refill, warm and well perfused.  Psych:    No HI/SI, judgement and insight good, Euthymic mood. Full Affect.   Recent Results (from the past 2160 hour(s))  Comprehensive metabolic panel     Status: Abnormal   Collection Time: 08/14/16  8:48 AM  Result Value Ref Range   Glucose 96 65 - 99 mg/dL   BUN 15 8 - 27 mg/dL   Creatinine, Ser 0.76 0.57 - 1.00 mg/dL   GFR calc non Af Amer 80 >59 mL/min/1.73   GFR calc Af Amer 92 >59 mL/min/1.73   BUN/Creatinine Ratio 20 12 - 28   Sodium 144 134 - 144 mmol/L   Potassium 4.2 3.5 - 5.2 mmol/L   Chloride 107 (H) 96 - 106 mmol/L   CO2 21 20 - 29 mmol/L   Calcium 9.4 8.7 - 10.3 mg/dL   Total Protein 7.3 6.0 - 8.5 g/dL   Albumin 4.4 3.5 - 4.8 g/dL   Globulin, Total 2.9 1.5 - 4.5 g/dL   Albumin/Globulin Ratio 1.5 1.2 - 2.2   Bilirubin Total 0.7 0.0 - 1.2 mg/dL   Alkaline Phosphatase 63 39 - 117 IU/L   AST 20 0 - 40 IU/L   ALT 17 0 - 32 IU/L  RPR     Status: None   Collection Time: 08/14/16  8:48 AM  Result Value Ref Range   RPR Ser Ql Non Reactive Non Reactive  Sedimentation rate     Status: None   Collection Time: 08/14/16  8:48 AM  Result Value Ref Range   Sed Rate 9 0 - 40 mm/hr  CBC     Status: None   Collection Time: 08/14/16  8:48 AM  Result Value Ref Range   WBC 6.2 3.4 - 10.8 x10E3/uL   RBC 4.39 3.77 - 5.28 x10E6/uL   Hemoglobin 13.5 11.1 - 15.9 g/dL   Hematocrit 38.7 34.0 - 46.6 %  MCV 88 79 - 97 fL   MCH 30.8 26.6 - 33.0 pg   MCHC 34.9 31.5 - 35.7 g/dL   RDW 13.8 12.3 - 15.4 %   Platelets 264 150 - 379 x10E3/uL  TSH     Status: None   Collection  Time: 08/14/16  8:48 AM  Result Value Ref Range   TSH 1.620 0.450 - 4.500 uIU/mL  Folate     Status: None   Collection Time: 08/14/16  8:48 AM  Result Value Ref Range   Folate >20.0 >3.0 ng/mL    Comment: A serum folate concentration of less than 3.1 ng/mL is considered to represent clinical deficiency.   Lipid panel     Status: Abnormal   Collection Time: 08/14/16  8:48 AM  Result Value Ref Range   Cholesterol, Total 210 (H) 100 - 199 mg/dL   Triglycerides 93 0 - 149 mg/dL   HDL 93 >39 mg/dL   VLDL Cholesterol Cal 19 5 - 40 mg/dL   LDL Calculated 98 0 - 99 mg/dL   Chol/HDL Ratio 2.3 0.0 - 4.4 ratio    Comment:                                   T. Chol/HDL Ratio                                             Men  Women                               1/2 Avg.Risk  3.4    3.3                                   Avg.Risk  5.0    4.4                                2X Avg.Risk  9.6    7.1                                3X Avg.Risk 23.4   11.0   Magnesium     Status: None   Collection Time: 08/14/16  8:48 AM  Result Value Ref Range   Magnesium 1.9 1.6 - 2.3 mg/dL  Phosphorus     Status: None   Collection Time: 08/14/16  8:48 AM  Result Value Ref Range   Phosphorus 2.9 2.5 - 4.5 mg/dL  VITAMIN D 25 Hydroxy (Vit-D Deficiency, Fractures)     Status: None   Collection Time: 08/14/16  8:48 AM  Result Value Ref Range   Vit D, 25-Hydroxy 50.3 30.0 - 100.0 ng/mL    Comment: Vitamin D deficiency has been defined by the [place] and an Endocrine Society practice guideline as a level of serum 25-OH vitamin D less than 20 ng/mL (1,2). The Endocrine Society went on to further define vitamin D insufficiency as a level between 21 and 29 ng/mL (2). 1. IOM (Institute of Medicine). 2010. Dietary reference    intakes for calcium and D. [place]:  The    [employer]. 2. Holick MF, Binkley [place], Bischoff-Ferrari HA, et al.    Evaluation, treatment, and prevention of vitamin  D    deficiency: an Endocrine Society clinical practice    guideline. JCEM. 2011 Jul; 96(7):1911-30.   Vitamin B12     Status: None   Collection Time: 08/14/16  8:48 AM  Result Value Ref Range   Vitamin B-12 1,007 232 - 1,245 pg/mL  T4, free     Status: None   Collection Time: 08/14/16  8:48 AM  Result Value Ref Range   Free T4 1.18 0.82 - 1.77 ng/dL

## 2016-11-10 NOTE — Assessment & Plan Note (Signed)
LDL less than 100 and HDL is over 90.  This is fantastic. - Continue to exercise / moving daily and eat healthy as she has been

## 2016-11-10 NOTE — Assessment & Plan Note (Signed)
Labs stable note reason that is apparent. - Symptoms have improved and patient does not wish to pursue concerns for this currently. - She will follow-up if becomes a problem in the future.

## 2016-11-10 NOTE — Assessment & Plan Note (Addendum)
Use otc omeprazole and ranitidine - Symptoms well controlled at current

## 2016-11-11 ENCOUNTER — Other Ambulatory Visit: Payer: Self-pay | Admitting: Cardiovascular Disease

## 2016-11-11 ENCOUNTER — Other Ambulatory Visit: Payer: Medicare Other

## 2016-11-12 NOTE — Telephone Encounter (Signed)
REFILL 

## 2016-11-25 ENCOUNTER — Ambulatory Visit: Payer: Medicare Other | Admitting: Family Medicine

## 2017-01-27 ENCOUNTER — Encounter: Payer: Self-pay | Admitting: Family Medicine

## 2017-01-27 ENCOUNTER — Ambulatory Visit (INDEPENDENT_AMBULATORY_CARE_PROVIDER_SITE_OTHER): Payer: Medicare Other | Admitting: Family Medicine

## 2017-01-27 VITALS — BP 150/68 | HR 60 | Ht 61.0 in | Wt 148.2 lb

## 2017-01-27 DIAGNOSIS — M62830 Muscle spasm of back: Secondary | ICD-10-CM

## 2017-01-27 DIAGNOSIS — M545 Low back pain, unspecified: Secondary | ICD-10-CM

## 2017-01-27 DIAGNOSIS — K219 Gastro-esophageal reflux disease without esophagitis: Secondary | ICD-10-CM

## 2017-01-27 MED ORDER — CYCLOBENZAPRINE HCL 10 MG PO TABS: 10.0000 mg | ORAL_TABLET | Freq: Three times a day (TID) | ORAL | 0 refills | Status: DC | PRN

## 2017-01-27 MED ORDER — IBUPROFEN 600 MG PO TABS: 600.0000 mg | ORAL_TABLET | Freq: Three times a day (TID) | ORAL | 0 refills | Status: DC | PRN

## 2017-01-27 NOTE — Patient Instructions (Signed)
As mentioned, please be aware that ibuprofen is just a short-term pain medicine.  Please use the muscle relaxers just at night to start as they can cause you to feel dizzy and lightheaded.  It can cause you to feel sleepy so try it at night after work first.  If pain not improved by 2-3 weeks with avoiding aggravating activities and taking meds as prescribed, please let us know as you will likely need physical therapy referral, possible imaging and reassessment by myself.   Back Pain, Adult Many adults have back pain from time to time. Common causes of back pain include:  A strained muscle or ligament.  Wear and tear (degeneration) of the spinal disks.  Arthritis.  A hit to the back.  Back pain can be short-lived (acute) or last a long time (chronic). A physical exam, lab tests, and imaging studies may be done to find the cause of your pain. Follow these instructions at home: Managing pain and stiffness  Take over-the-counter and prescription medicines only as told by your health care provider.  If directed, apply heat to the affected area as often as told by your health care provider. Use the heat source that your health care provider recommends, such as a moist heat pack or a heating pad. ? Place a towel between your skin and the heat source. ? Leave the heat on for 20-30 minutes. ? Remove the heat if your skin turns bright red. This is especially important if you are unable to feel pain, heat, or cold. You have a greater risk of getting burned.  If directed, apply ice to the injured area: ? Put ice in a plastic bag. ? Place a towel between your skin and the bag. ? Leave the ice on for 20 minutes, 2-3 times a day for the first 2-3 days. Activity  Do not stay in bed. Resting more than 1-2 days can delay your recovery.  Take short walks on even surfaces as soon as you are able. Try to increase the length of time you walk each day.  Do not sit, drive, or stand in one place for  more than 30 minutes at a time. Sitting or standing for long periods of time can put stress on your back.  Use proper lifting techniques. When you bend and lift, use positions that put less stress on your back: ? [place] your knees. ? Keep the load close to your body. ? Avoid twisting.  Exercise regularly as told by your health care provider. Exercising will help your back heal faster. This also helps prevent back injuries by keeping muscles strong and flexible.  Your health care provider may recommend that you see a physical therapist. This person can help you come up with a safe exercise program. Do any exercises as told by your physical therapist. Lifestyle  Maintain a healthy weight. Extra weight puts stress on your back and makes it difficult to have good posture.  Avoid activities or situations that make you feel anxious or stressed. Learn ways to manage anxiety and stress. One way to manage stress is through exercise. Stress and anxiety increase muscle tension and can make back pain worse. General instructions  Sleep on a firm mattress in a comfortable position. Try lying on your side with your knees slightly bent. If you lie on your back, put a pillow under your knees.  Follow your treatment plan as told by your health care provider. This may include: ? Cognitive or behavioral therapy. ? Acupuncture  or massage therapy. ? Meditation or yoga. Contact a health care provider if:  You have pain that is not relieved with rest or medicine.  You have increasing pain going down into your legs or buttocks.  Your pain does not improve in 2 weeks.  You have pain at night.  You lose weight.  You have a fever or chills. Get help right away if:  You develop new bowel or bladder control problems.  You have unusual weakness or numbness in your arms or legs.  You develop nausea or vomiting.  You develop abdominal pain.  You feel faint. Summary  Many adults have back pain from time  to time. A physical exam, lab tests, and imaging studies may be done to find the cause of your pain.  Use proper lifting techniques. When you bend and lift, use positions that put less stress on your back.  Take over-the-counter and prescription medicines and apply heat or ice as directed by your health care provider. This information is not intended to replace advice given to you by your health care provider. Make sure you discuss any questions you have with your health care provider. Document Released: 01/12/2005 Document Revised: 02/17/2016 Document Reviewed: 02/17/2016 Elsevier Interactive Patient Education  [employer].

## 2017-01-27 NOTE — Progress Notes (Signed)
Pt here for an acute care OV today   Impression and Recommendations:    1. Acute low back pain without sciatica, unspecified back pain laterality   2. Muscle spasm of back   3. Gastroesophageal reflux disease without esophagitis     1. Acute Low Back Pain - X-ray is not recommended at this time since back pain acute.  Will consider if continues into chronic phase..  - If she still has the pain after 2 weeks, we will consider following up with further management, X-ray, referral.  - Motrin 600 mg every 8 hours, and a muscle relaxer for 2-3 weeks.  Did discuss risk benefits of [place] skeletal relaxers and told patient to use just at night to start.  - Advised to continue with regular life but avoiding any aggravating activities (I.e. heavy lifting, shoveling, trimming hedges).  - Patient knows to call the clinic earlier if necessary.   Meds ordered this encounter  Medications  . ibuprofen (ADVIL,MOTRIN) 600 MG tablet    Sig: Take 1 tablet (600 mg total) by mouth every 8 (eight) hours as needed.    Dispense:  30 tablet    Refill:  0  . cyclobenzaprine (FLEXERIL) 10 MG tablet    Sig: Take 1 tablet (10 mg total) by mouth 3 (three) times daily as needed for muscle spasms.    Dispense:  30 tablet    Refill:  0     Education and routine counseling performed. Handouts provided  Gross side effects, risk and benefits, and alternatives of medications and treatment plan in general discussed with patient.  Patient is aware that all medications have potential side effects and we are unable to predict every side effect or drug-drug interaction that may occur.   Patient will call with any questions prior to using medication if they have concerns.  Expresses verbal understanding and consents to current therapy and treatment regimen.  No barriers to understanding were identified.  Red flag symptoms and signs discussed in detail.  Patient expressed understanding regarding what to do in case of  emergency\urgent symptoms   Please see AVS handed out to patient at the end of our visit for further patient instructions/ counseling done pertaining to today's office visit.   Return if symptoms worsen or fail to improve in 2-3 wks.     Note: This document was prepared using Dragon voice recognition software and may include unintentional dictation errors.  This document serves as a record of services personally performed by Mellody Dance, DO. It was created on her behalf by Toni Amend, a trained medical scribe. The creation of this record is based on the scribe's personal observations and the provider's statements to them.   I have reviewed the above documentation for accuracy and completeness, and I agree with the above.   Mellody Dance 01/27/17 11:30 AM     --------------------------------------------------------------------------------------------------------------------------------------------------------------------------------------------------------------------------------------------    Subjective:    CC:  Chief Complaint  Patient presents with  . Back Pain    right side low back pain     HPI: Brittany Harvey is a 72 y.o. female who presents to [facility] today for issues as discussed below.  She had a nice Christmas and New Year's. The patient believes she shoveled too much snow and reports pain in her lower back. She gestures to both sides of her lower back while on the exam table, indicating bilateral lower and midline back pain. The patient notes that this  pain started about one week after she shoveled snow. She describes the pain as a dull aching pain that comes and goes, and denies radiation. This pain is worse in the morning after waking up, and worse after moving after prolonged periods of rest. Tylenol is not relieving her symptoms.  The patient notes that alleve makes her sleepy. She has taken tylenol and aspirin before  and can tolerate motrin.   Wt Readings from Last 3 Encounters:  01/27/17 148 lb 3.2 oz (67.2 kg)  11/10/16 146 lb 9.6 oz (66.5 kg)  09/16/16 147 lb (66.7 kg)   BP Readings from Last 3 Encounters:  01/27/17 (!) 150/68  11/10/16 139/76  10/29/16 126/68   BMI Readings from Last 3 Encounters:  01/27/17 28.00 kg/m  11/10/16 27.70 kg/m  09/16/16 27.78 kg/m     Patient Care Team    Relationship Specialty Notifications Start End  Mellody Dance, DO PCP - General Family Medicine  08/19/15   Juanita Craver, MD Consulting Physician Gastroenterology  09/24/15   Lorretta Harp, MD Consulting Physician Cardiology  09/24/15   Everlene Farrier, MD Consulting Physician Obstetrics and Gynecology  09/24/15    Comment: Juanda Chance, NP     Patient Active Problem List   Diagnosis Date Noted  . Prediabetes 09/24/2015    Priority: High  . Fatigue 09/24/2015    Priority: High  . h/o HLD (hyperlipidemia) 08/20/2015    Priority: High  . Essential hypertension 07/24/2014    Priority: High  . PSVT (paroxysmal supraventricular tachycardia) ([employer]) 03/27/2013    Priority: High  . Elevated HDL = 90 09/24/2015    Priority: Medium  . GERD (gastroesophageal reflux disease) 11/27/2013    Priority: Medium  . Vitamin D deficiency 11/10/2016    Priority: Low  . Environmental and seasonal allergies 06/10/2016    Priority: Low  . Overweight (BMI 25.0-29.9) 09/24/2015    Priority: Low  . Osteopenia by Dexa (8-9 yrs ago)  08/20/2015    Priority: Low  . Stressful job 08/12/2016  . Tiredness 08/12/2016  . Nonintractable headache 08/12/2016  . Other acute fatigue 08/12/2016  . Benign positional vertigo, bilateral 06/10/2016  . Upper back strain 05/07/2016  . Cough 03/06/2016    Past Medical history, Surgical history, Family history, Social history, Allergies and Medications have been entered into the medical record, reviewed and changed as needed.    Current Meds  Medication Sig  . acetaminophen  (TYLENOL) 500 MG tablet Take 500 mg by mouth daily as needed (pain).  [name] aspirin EC 81 MG tablet Take 81 mg by mouth daily.  . Calcium Citrate-Vitamin D (CALCIUM CITRATE + D PO) Take 1 tablet by mouth daily.  . Cholecalciferol (VITAMIN D3) 1000 units CAPS Take 5 capsules by mouth daily. Told pt to increase to 5,000 IU per day  . Cranberry 1000 MG CAPS Take 1 capsule by mouth daily.  . fluticasone (FLONASE) 50 MCG/ACT nasal spray Place 1 spray into both nostrils 2 (two) times daily.  [name] loratadine (CLARITIN) 10 MG tablet Take 1 tablet (10 mg total) by mouth daily.  . metoprolol succinate (TOPROL-XL) 25 MG 24 hr tablet TAKE 1 AND 1/2 TABLETS EVERY DAY  . Multiple Vitamin (MULTIVITAMIN WITH MINERALS) TABS tablet Take 1 tablet by mouth daily. Centrum Silver  . Omega-3 Fatty Acids (FISH OIL) 1000 MG CAPS Take 1 capsule by mouth daily.  [name] triamcinolone cream (KENALOG) 0.1 % Apply 1 application topically daily as needed (itching/eczema).     Allergies:  Allergies  Allergen Reactions  . Penicillins Anaphylaxis and Swelling  . Tussin Dm [Guaifenesin-Dm] Other (See Comments)    Dizziness/ Lightheadness  . Zithromax [Azithromycin] Other (See Comments)    Pre-syncope     Review of Systems: General:   Denies fever, chills, unexplained weight loss.  Optho/Auditory:   Denies visual changes, blurred vision/LOV Respiratory:   Denies wheeze, DOE more than baseline levels.  Cardiovascular:   Denies chest pain, palpitations, new onset peripheral edema  Gastrointestinal:   Denies nausea, vomiting, diarrhea, abd pain.  Genitourinary: Denies dysuria, freq/ urgency, flank pain or discharge from genitals.  Endocrine:     Denies hot or cold intolerance, polyuria, polydipsia. Musculoskeletal:   +low back pain. Denies unexplained myalgias, joint swelling, unexplained arthralgias, gait problems.  Skin:  Denies new onset rash, suspicious lesions Neurological:     Denies dizziness, unexplained weakness, numbness    Psychiatric/Behavioral:   Denies mood changes, suicidal or homicidal ideations, hallucinations    Objective:   Blood pressure (!) 150/68, pulse 60, height 5\' 1"  (1.549 m), weight 148 lb 3.2 oz (67.2 kg), SpO2 98 %. Body mass index is 28 kg/m. General:  Well Developed, well nourished, appropriate for stated age.  Neuro:  Alert and oriented,  extra-ocular muscles intact  HEENT:  Normocephalic, atraumatic, neck supple Skin:  no gross rash, warm, pink. Cardiac:  RRR, S1 S2 Respiratory:  ECTA B/L and A/P, Not using accessory muscles, speaking in full sentences- unlabored. Vascular:  Ext warm, no cyanosis apprec.; cap RF less 2 sec. Psych:  No HI/SI, judgement and insight good, Euthymic mood. Full Affect. Lower Back: Some midline tenderness to deep palpation in the L4-L5 region.  Paraspinal muscle tightness L2 through 5 bilaterally left greater than right.,  5 out of 5 strength bilateral lower extremities, 2\4 reflexes equal throughout.  Neurovascular intact distally.  Full range of motion with only minor discomfort with extension of the back.

## 2017-03-15 ENCOUNTER — Encounter: Payer: Self-pay | Admitting: Family Medicine

## 2017-03-15 ENCOUNTER — Ambulatory Visit (INDEPENDENT_AMBULATORY_CARE_PROVIDER_SITE_OTHER): Payer: Medicare Other | Admitting: Family Medicine

## 2017-03-15 VITALS — BP 181/70 | HR 70 | Ht 60.98 in | Wt 154.0 lb

## 2017-03-15 DIAGNOSIS — R109 Unspecified abdominal pain: Secondary | ICD-10-CM

## 2017-03-15 DIAGNOSIS — R7303 Prediabetes: Secondary | ICD-10-CM

## 2017-03-15 DIAGNOSIS — R5382 Chronic fatigue, unspecified: Secondary | ICD-10-CM | POA: Diagnosis not present

## 2017-03-15 DIAGNOSIS — E559 Vitamin D deficiency, unspecified: Secondary | ICD-10-CM | POA: Diagnosis not present

## 2017-03-15 DIAGNOSIS — E7889 Other lipoprotein metabolism disorders: Secondary | ICD-10-CM

## 2017-03-15 DIAGNOSIS — E7800 Pure hypercholesterolemia, unspecified: Secondary | ICD-10-CM | POA: Diagnosis not present

## 2017-03-15 DIAGNOSIS — I10 Essential (primary) hypertension: Secondary | ICD-10-CM | POA: Diagnosis not present

## 2017-03-15 LAB — POCT GLYCOSYLATED HEMOGLOBIN (HGB A1C): Hemoglobin A1C: 5.5

## 2017-03-15 NOTE — Progress Notes (Signed)
Impression and Recommendations:    1. Prediabetes   2. Pure hypercholesterolemia   3. Fatigue   4. Essential hypertension   5. Elevated HDL = 90   6. Vitamin D deficiency   7. White coat syndrome with hypertension   8. Abdominal pain, unspecified abdominal location -- R mid abdomin       1. Prediabetes - Reviewed that an A1C of 5.7 to 6.4 is prediabetic. 2 years ago, her A1C was 5.8 (04/2015). Last A1C was 5.6. Today, A1C is 5.5.  - Advised the patient not to gain any more weight, or her A1C may go up. - Patient should continue to monitor what she eats and try to cut down on carbs.  - Information on prediabetes provided to the patient as a reminder.  2. HTN (with White Coat Syndrome) - Will re-check her blood pressure before she leaves. - Continue to monitor her blood pressure. - Re-check in office 162/78 - Re-re-check in office   - Continue to check blood pressure at home. - Bring in log of blood pressures next office visit. - If blood pressure does not remain well-controlled at home, follow-up sooner than planned.  3. Mood/Anxiety/Fatigue - Mood is improved. - Fatigue is improved.  4. Right-sided Mid-Abdominal Pain - Patient will pay attention to what makes her fleeting right-sided mid-abdominal pain come on. - Note what makes it worse or better - Is it worse with certain foods?  - Is it alleviated with stooling? - Does food make it worse or better? - Is it positionally related? - Advised the patient that the pain is likely due to constipation or gas, and it is reassurring that her exam is WNL's. - Reviewed the importance of adequate hydration when it comes to alleviating GI upset.  - Miralax recommended OTC for daily use if desired. - If her stools are extremely hard, in pellets, dulcolax is reasonable to use.  5. General Health Maintenance - Advised patient to continue working toward exercising to improve health.    - Patient may begin with 15 minutes  of activity daily. Recommended that the patient eventually strive for at least 150 minutes of cardio per week according to guidelines established by the [facility].   - Healthy dietary habits encouraged, including low-carb, and high amounts of lean protein in diet.   - Patient should also consume adequate amounts of water - half of body weight in oz of water per day - Advised the patient at length that adequate water intake helps combat many health concerns, including constipation.  6. Follow-Up - Continue on all medications as prescribed. - 4 months, return for blood pressure, prediabetes, etc., chronic follow up. - If symptoms worsen in abdomen, follow-up as needed.  Orders Placed This Encounter  Procedures  . POCT HgB A1C    Gross side effects, risk and benefits, and alternatives of medications and treatment plan in general discussed with patient.  Patient is aware that all medications have potential side effects and we are unable to predict every side effect or drug-drug interaction that may occur.   Patient will call with any questions prior to using medication if they have concerns.  Expresses verbal understanding and consents to current therapy and treatment regimen.  No barriers to understanding were identified.  Red flag symptoms and signs discussed in detail.  Patient expressed understanding regarding what to do in case of emergency\urgent symptoms  Please see AVS handed out to patient at the end of our  visit for further patient instructions/ counseling done pertaining to today's office visit.   Return if symptoms worsen in abd- f/up needed, for 4 mo or so - BP etc.    Note: This note was prepared with assistance of Dragon voice recognition software. Occasional wrong-word or sound-a-like substitutions may have occurred due to the inherent limitations of voice recognition software.   This document serves as a record of services personally performed by Mellody Dance, DO. It was created  on her behalf by Toni Amend, a trained medical scribe. The creation of this record is based on the scribe's personal observations and the provider's statements to them.   I have reviewed the above medical documentation for accuracy and completeness and I concur.  Mellody Dance 04/03/17 3:05 PM   --------------------------------------------------------------------------------------------------------------------------------------------------------------------------------------------------------------   Subjective:     HPI: Brittany Harvey is a 72 y.o. female who presents to [place] at [facility] today for chronic follow up, on issues as discussed below.  Prediabetes Last A1C was: 5.6 A1C today - 5.5  Notes that she has put on a little weight - 6 lbs since January 27, 2017.  She has been watching what she eats - eating less bread, and eating less meat. Patient notes that she loves eating biscuits.  HTN (with White Coat Syndrome) Blood pressure measured on intake today is very high, at 181/70. Re-check in office 162/78 She takes her blood pressure at home and it's never high, but she did rush here today from work. Notes that her blood pressure typically falls around 127/70,72 at home.  She does believe that her blood pressure goes up at the doctor's office, especially after she has to drive to work.  Mood/Anxiety/Fatigue Notes that her mood is good. Fatigue has improved.  She is back to work full time, and previously was working just 2-3 days per week. Notes that she is happier when she's working.  "As long as I'm able bodied, why quit working?"  She says she does find things to do, so she tries not to be depressed.  Osteopenia She continues taking 5000 Vitamin D per day.  Patient notes "I am still taking everything."  Right Sided Mid-Abdominal Pain Has been having right sided pain that comes and goes. Notes that "it almost felt like there was  something there." Wonders if it could be a kidney stone. Onset was 2 weeks ago - pain is not constant- now resolved. Mid-abdominal pain.  Has never had this pain prior. This is nothing like her historical kidney stone pain.  She does not know what makes it worse, has not paid attention to this. Hasn't paid attention if it gets better or worse with food. The pain is sharp and stabbing for a second or two, and resolves spontaneously. Lasts a few seconds.  Says it simply "seems like it goes away." The pain occurs maybe once per day.  Has not had it the past couple of days- cleared now. - it could have resulted from heavy lifting at work. Her back was also hurting at that time (of heavy lifting- but nothing new).  Does not think it's positional, but it does erupt sometimes when she's walking.  Denies burning with urination, frequency of urination, denies nausea, vomiting, diarrhea.  She has been constipated for about a week.  She notes that she "goes a little," but has to take something to help.  Patient has a couple of big water bottles at work.   Wt Readings from  Last 3 Encounters:  03/15/17 154 lb (69.9 kg)  01/27/17 148 lb 3.2 oz (67.2 kg)  11/10/16 146 lb 9.6 oz (66.5 kg)   BP Readings from Last 3 Encounters:  03/15/17 (!) 181/70  01/27/17 (!) 150/68  11/10/16 139/76   Pulse Readings from Last 3 Encounters:  03/15/17 70  01/27/17 60  11/10/16 (!) 59   BMI Readings from Last 3 Encounters:  03/15/17 29.11 kg/m  01/27/17 28.00 kg/m  11/10/16 27.70 kg/m     Patient Care Team    Relationship Specialty Notifications Start End  Mellody Dance, DO PCP - General Family Medicine  08/19/15   Juanita Craver, MD Consulting Physician Gastroenterology  09/24/15   Lorretta Harp, MD Consulting Physician Cardiology  09/24/15   Everlene Farrier, MD Consulting Physician Obstetrics and Gynecology  09/24/15    Comment: Juanda Chance, NP     Patient Active Problem List   Diagnosis  Date Noted  . White coat syndrome with hypertension 03/15/2017    Priority: High  . Prediabetes 09/24/2015    Priority: High  . h/o HLD (hyperlipidemia) 08/20/2015    Priority: High  . Essential hypertension 07/24/2014    Priority: High  . PSVT (paroxysmal supraventricular tachycardia) ([place]) 03/27/2013    Priority: High  . Elevated HDL = 90 09/24/2015    Priority: Medium  . Fatigue 09/24/2015    Priority: Medium  . GERD (gastroesophageal reflux disease) 11/27/2013    Priority: Medium  . Vitamin D deficiency 11/10/2016    Priority: Low  . Environmental and seasonal allergies 06/10/2016    Priority: Low  . Upper back strain 05/07/2016    Priority: Low  . Overweight (BMI 25.0-29.9) 09/24/2015    Priority: Low  . Osteopenia by Dexa (8-9 yrs ago)  08/20/2015    Priority: Low  . Stressful job 08/12/2016  . Tiredness 08/12/2016  . Nonintractable headache 08/12/2016  . Other acute fatigue 08/12/2016  . Benign positional vertigo, bilateral 06/10/2016  . Cough 03/06/2016    Past Medical history, Surgical history, Family history, Social history, Allergies and Medications have been entered into the medical record, reviewed and changed as needed.    Current Meds  Medication Sig  . acetaminophen (TYLENOL) 500 MG tablet Take 500 mg by mouth daily as needed (pain).  [name] aspirin EC 81 MG tablet Take 81 mg by mouth daily.  . Calcium Citrate-Vitamin D (CALCIUM CITRATE + D PO) Take 1 tablet by mouth daily.  . Cholecalciferol (VITAMIN D3) 1000 units CAPS Take 5 capsules by mouth daily. Told pt to increase to 5,000 IU per day  . Cranberry 1000 MG CAPS Take 1 capsule by mouth daily.  . cyclobenzaprine (FLEXERIL) 10 MG tablet Take 1 tablet (10 mg total) by mouth 3 (three) times daily as needed for muscle spasms.  . fluticasone (FLONASE) 50 MCG/ACT nasal spray Place 1 spray into both nostrils 2 (two) times daily.  [name] ibuprofen (ADVIL,MOTRIN) 600 MG tablet Take 1 tablet (600 mg total) by mouth  every 8 (eight) hours as needed.  . loratadine (CLARITIN) 10 MG tablet Take 1 tablet (10 mg total) by mouth daily.  . metoprolol succinate (TOPROL-XL) 25 MG 24 hr tablet TAKE 1 AND 1/2 TABLETS EVERY DAY  . Multiple Vitamin (MULTIVITAMIN WITH MINERALS) TABS tablet Take 1 tablet by mouth daily. Centrum Silver  . Omega-3 Fatty Acids (FISH OIL) 1000 MG CAPS Take 1 capsule by mouth daily.  [name] triamcinolone cream (KENALOG) 0.1 % Apply 1 application topically daily as  needed (itching/eczema).     Allergies:  Allergies  Allergen Reactions  . Penicillins Anaphylaxis and Swelling  . Tussin Dm [Guaifenesin-Dm] Other (See Comments)    Dizziness/ Lightheadness  . Zithromax [Azithromycin] Other (See Comments)    Pre-syncope     Review of Systems:  A fourteen system review of systems was performed and found to be positive as per HPI.   Objective:   Blood pressure (!) 181/70, pulse 70, height 5' 0.98" (1.549 m), weight 154 lb (69.9 kg), SpO2 96 %. Body mass index is 29.11 kg/m. General:  Well Developed, well nourished, appropriate for stated age.  Neuro:  Alert and oriented,  extra-ocular muscles intact  HEENT:  Normocephalic, atraumatic, neck supple, no carotid bruits appreciated  Skin:  no gross rash, warm, pink. Cardiac:  RRR, S1 S2 Respiratory:  ECTA B/L and A/P, Not using accessory muscles, speaking in full sentences- unlabored. Abd: Positive bowel sounds x4, no guarding rigidity rebound, non-tender non-distended, No OM Vascular:  Ext warm, no cyanosis apprec.; cap RF less 2 sec. Psych:  No HI/SI, judgement and insight good, Euthymic mood. Full Affect.

## 2017-03-15 NOTE — Patient Instructions (Addendum)
Cont. to check BP at home, bring in log next OV.   If it does NOT remain well controlled at home, f/up sooner than planned    Risk factors for prediabetes and type 2 diabetes  Researchers don't fully understand why some people develop prediabetes and type 2 diabetes and others don't.  It's clear that certain factors increase the risk, however, including:  Weight. The more fatty tissue you have, the more resistant your cells become to insulin.  Inactivity. The less active you are, the greater your risk. Physical activity helps you control your weight, uses up glucose as energy and makes your cells more sensitive to insulin.  Family history. Your risk increases if a parent or sibling has type 2 diabetes.  Race. Although it's unclear why, people of certain races - including blacks, Hispanics, American Indians and Asian-Americans - are at higher risk.  Age. Your risk increases as you get older. This may be because you tend to exercise less, lose muscle mass and gain weight as you age. But type 2 diabetes is also increasing dramatically among children, adolescents and younger adults.  Gestational diabetes. If you developed gestational diabetes when you were pregnant, your risk of developing prediabetes and type 2 diabetes later increases. If you gave birth to a baby weighing more than 9 pounds (4 kilograms), you're also at risk of type 2 diabetes.  Polycystic ovary syndrome. For women, having polycystic ovary syndrome - a common condition characterized by irregular menstrual periods, excess hair growth and obesity - increases the risk of diabetes.  High blood pressure. Having blood pressure over 140/90 millimeters of mercury (mm Hg) is linked to an increased risk of type 2 diabetes.  Abnormal cholesterol and triglyceride levels. If you have low levels of high-density lipoprotein (HDL), or "good," cholesterol, your risk of type 2 diabetes is higher. Triglycerides are another type of fat carried in the  blood. People with high levels of triglycerides have an increased risk of type 2 diabetes. Your doctor can let you know what your cholesterol and triglyceride levels are.  A good guide to good carbs: The glycemic index ---If you have diabetes, or at risk for diabetes, you know all too well that when you eat carbohydrates, your blood sugar goes up. The total amount of carbs you consume at a meal or in a snack mostly determines what your blood sugar will do. But the food itself also plays a role. A serving of white rice has almost the same effect as eating pure table sugar - a quick, high spike in blood sugar. A serving of lentils has a slower, smaller effect.  ---Picking good sources of carbs can help you control your blood sugar and your weight. Even if you don't have diabetes, eating healthier carbohydrate-rich foods can help ward off a host of chronic conditions, from heart disease to various cancers to, well, diabetes.  ---One way to choose foods is with the glycemic index (GI). This tool measures how much a food boosts blood sugar.  The glycemic index rates the effect of a specific amount of a food on blood sugar compared with the same amount of pure glucose. A food with a glycemic index of 28 boosts blood sugar only 28% as much as pure glucose. One with a GI of 95 acts like pure glucose.    High glycemic foods result in a quick spike in insulin and blood sugar (also known as blood glucose).  Low glycemic foods have a slower, smaller effect-  these are healthier for you.   Using the glycemic index Using the glycemic index is easy: choose foods in the low GI category instead of those in the high GI category (see below), and go easy on those in between. Low glycemic index (GI of 55 or less): Most fruits and vegetables, beans, minimally processed grains, pasta, low-fat dairy foods, and nuts.  Moderate glycemic index (GI 56 to 69): White and sweet potatoes, corn, white rice, couscous, breakfast  cereals such as Cream of Wheat and Mini Wheats.  High glycemic index (GI of 70 or higher): White bread, rice cakes, most crackers, bagels, cakes, doughnuts, croissants, most packaged breakfast cereals. You can see the values for 100 commons foods and get links to more at www.health.CheapToothpicks.si.  Swaps for lowering glycemic index  Instead of this high-glycemic index food Eat this lower-glycemic index food  White rice Brown rice or converted rice  Instant oatmeal Steel-cut oats  Cornflakes Bran flakes  Baked potato Pasta, bulgur  White bread Whole-grain bread  Corn Peas or leafy greens       Prediabetes Eating Plan  Prediabetes--also called impaired glucose tolerance or impaired fasting glucose--is a condition that causes blood sugar (blood glucose) levels to be higher than normal. Following a healthy diet can help to keep prediabetes under control. It can also help to lower the risk of type 2 diabetes and heart disease, which are increased in people who have prediabetes. Along with regular exercise, a healthy diet:  Promotes weight loss.  Helps to control blood sugar levels.  Helps to improve the way that the body uses insulin.   WHAT DO I NEED TO KNOW ABOUT THIS EATING PLAN?   Use the glycemic index (GI) to plan your meals. The index tells you how quickly a food will raise your blood sugar. Choose low-GI foods. These foods take a longer time to raise blood sugar.  Pay close attention to the amount of carbohydrates in the food that you eat. Carbohydrates increase blood sugar levels.  Keep track of how many calories you take in. Eating the right amount of calories will help you to achieve a healthy weight. Losing about 7 percent of your starting weight can help to prevent type 2 diabetes.  You may want to follow a Mediterranean diet. This diet includes a lot of vegetables, lean meats or fish, whole grains, fruits, and healthy oils and fats.   WHAT FOODS CAN I  EAT?  Grains Whole grains, such as whole-wheat or whole-grain breads, crackers, cereals, and pasta. Unsweetened oatmeal. Bulgur. Barley. Quinoa. Brown rice. Corn or whole-wheat flour tortillas or taco shells. Vegetables Lettuce. Spinach. Peas. Beets. Cauliflower. Cabbage. Broccoli. Carrots. Tomatoes. Squash. Eggplant. Herbs. Peppers. Onions. Cucumbers. Brussels sprouts. Fruits Berries. Bananas. Apples. Oranges. Grapes. Papaya. Mango. Pomegranate. Kiwi. Grapefruit. Cherries. Meats and Other Protein Sources Seafood. Lean meats, such as chicken and [place] or lean cuts of pork and beef. Tofu. Eggs. Nuts. Beans. Dairy Low-fat or fat-free dairy products, such as yogurt, cottage cheese, and cheese. Beverages Water. Tea. Coffee. Sugar-free or diet soda. Seltzer water. Milk. Milk alternatives, such as soy or almond milk. Condiments Mustard. Relish. Low-fat, low-sugar ketchup. Low-fat, low-sugar barbecue sauce. Low-fat or fat-free mayonnaise. Sweets and Desserts Sugar-free or low-fat pudding. Sugar-free or low-fat ice cream and other frozen treats. Fats and Oils Avocado. Walnuts. Olive oil. The items listed above may not be a complete list of recommended foods or beverages. Contact your dietitian for more options.    WHAT FOODS ARE NOT  RECOMMENDED?  Grains Refined white flour and flour products, such as bread, pasta, snack foods, and cereals. Beverages Sweetened drinks, such as sweet iced tea and soda. Sweets and Desserts Baked goods, such as cake, cupcakes, pastries, cookies, and cheesecake. The items listed above may not be a complete list of foods and beverages to avoid. Contact your dietitian for more information.   This information is not intended to replace advice given to you by your health care provider. Make sure you discuss any questions you have with your health care provider.   Document Released: 05/29/2014 Document Reviewed: 05/29/2014 Elsevier Interactive Patient Education  [employer].

## 2017-03-16 ENCOUNTER — Telehealth: Payer: Self-pay | Admitting: Family Medicine

## 2017-03-16 NOTE — Telephone Encounter (Signed)
Noted MPulliam, CMA/RT(R)  

## 2017-03-16 NOTE — Telephone Encounter (Signed)
Patient called to says she think she had (white coat syndrome)--- 2/18, when she got home she took it :  Left arm read -- 126/68 Right arm -- 126/74  Today at work it was -- 133/72  Patient just wanted doctor to know.  --glh

## 2017-07-01 DIAGNOSIS — M8588 Other specified disorders of bone density and structure, other site: Secondary | ICD-10-CM | POA: Diagnosis not present

## 2017-07-01 DIAGNOSIS — Z6828 Body mass index (BMI) 28.0-28.9, adult: Secondary | ICD-10-CM | POA: Diagnosis not present

## 2017-07-01 DIAGNOSIS — Z01419 Encounter for gynecological examination (general) (routine) without abnormal findings: Secondary | ICD-10-CM | POA: Diagnosis not present

## 2017-07-01 DIAGNOSIS — Z1231 Encounter for screening mammogram for malignant neoplasm of breast: Secondary | ICD-10-CM | POA: Diagnosis not present

## 2017-07-01 DIAGNOSIS — N958 Other specified menopausal and perimenopausal disorders: Secondary | ICD-10-CM | POA: Diagnosis not present

## 2017-09-15 ENCOUNTER — Other Ambulatory Visit: Payer: Self-pay | Admitting: Cardiovascular Disease

## 2017-09-16 NOTE — Telephone Encounter (Signed)
Rx sent to pharmacy   

## 2017-10-12 ENCOUNTER — Telehealth: Payer: Self-pay | Admitting: Cardiovascular Disease

## 2017-10-12 NOTE — Telephone Encounter (Signed)
New Message:    Patient would like to know why  there is no refill allowed for metoprolol succinate (TOPROL-XL) 25 MG 24 hr tablet

## 2017-10-12 NOTE — Telephone Encounter (Signed)
Returned call to pt, annual appt scheduled

## 2017-11-03 ENCOUNTER — Encounter: Payer: Self-pay | Admitting: Cardiovascular Disease

## 2017-11-03 ENCOUNTER — Ambulatory Visit (INDEPENDENT_AMBULATORY_CARE_PROVIDER_SITE_OTHER): Payer: Medicare Other | Admitting: Cardiovascular Disease

## 2017-11-03 VITALS — BP 136/78 | HR 75 | Ht 60.0 in | Wt 149.0 lb

## 2017-11-03 DIAGNOSIS — I10 Essential (primary) hypertension: Secondary | ICD-10-CM

## 2017-11-03 DIAGNOSIS — I200 Unstable angina: Secondary | ICD-10-CM | POA: Diagnosis not present

## 2017-11-03 DIAGNOSIS — R0602 Shortness of breath: Secondary | ICD-10-CM | POA: Diagnosis not present

## 2017-11-03 DIAGNOSIS — E7800 Pure hypercholesterolemia, unspecified: Secondary | ICD-10-CM

## 2017-11-03 NOTE — Patient Instructions (Signed)
Medication Instructions:  Your physician recommends that you continue on your current medications as directed. Please refer to the Current Medication list given to you today.  If you need a refill on your cardiac medications before your next appointment, please call your pharmacy.   Lab work: none If you have labs (blood work) drawn today and your tests are completely normal, you will receive your results only by: [name] MyChart Message (if you have MyChart) OR . A paper copy in the mail If you have any lab test that is abnormal or we need to change your treatment, we will call you to review the results.  Testing/Procedures: Your physician has requested that you have a lexiscan myoview. For further information please visit [email or web address]. Please follow instruction sheet, as given.  Your physician has requested that you have an echocardiogram. Echocardiography is a painless test that uses sound waves to create images of your heart. It provides your doctor with information about the size and shape of your heart and how well your heart's chambers and valves are working. This procedure takes approximately one hour. There are no restrictions for this procedure.   Follow-Up: At [facility], you and your health needs are our priority.  As part of our continuing mission to provide you with exceptional heart care, we have created designated Provider Care Teams.  These Care Teams include your primary Cardiologist (physician) and Advanced Practice Providers (APPs -  Physician Assistants and Nurse Practitioners) who all work together to provide you with the care you need, when you need it. You will need a follow up appointment in 3 months with extender and 12 months with Dr. Gwenlyn Found.  Please call our office 2 months in advance to schedule this appointment.  You may see Quay Burow, MD or one of the following Advanced Practice Providers on your designated Care Team:   [name], PA-C Roby Lofts,  [place] . Sande Rives, PA-C  Any Other Special Instructions Will Be Listed Below (If Applicable).

## 2017-11-03 NOTE — Assessment & Plan Note (Signed)
History of essential hypertension blood pressure measured at 136/78 she is on metoprolol.  Current meds at current dosing.

## 2017-11-03 NOTE — Assessment & Plan Note (Signed)
Brittany Harvey has related several month history of atypical back and chest pain with associated shortness of breath as of late night which occasionally awakens her from sleep.  She is had an abnormal GXT in the past which led to a normal Myoview.  She had a normal 2D echo 4 years ago as well.  We will repeat the echo and obtain a pharmacologic Myoview stress test to rule out an ischemic etiology.

## 2017-11-03 NOTE — Progress Notes (Signed)
11/03/2017 MARELY APGAR   November 16, 1945  [id number]  Primary Physician Mellody Dance, DO Primary Cardiologist: Lorretta Harp MD Garret Reddish, [place], [place]  HPI:  Brittany Harvey is a 72 y.o.  moderately overweight widowed Caucasian female who I last saw 09/16/2016. She saw Dr. Sallyanne Kuster back in 2011. She has a history of PSVT remotely back in 2004. She had a false positive GXT back in 2011 which led to a Myoview which was normal. She was admitted to the hospital back in March after I saw her in the office for chest pain/rule out myocardial infarction. A stress MR I was completely normal. She said since and no recurrent symptoms. The presumed etiology of his of her chest pain was reflux. She saw [name] in the office 11/27/13 after she had been seen at [facility] emergency room for PSVT requiring adenosine conversion. She's had no recurrent symptoms. Since I saw her a year ago she is remained stable until several months ago when she is developed some atypical back and chest pain associated with some shortness of breath.  The shortness of breath has awakened from sleep as well.  Current Meds  Medication Sig  . acetaminophen (TYLENOL) 500 MG tablet Take 500 mg by mouth daily as needed (pain).  [name] aspirin EC 81 MG tablet Take 81 mg by mouth daily.  . Calcium Citrate-Vitamin D (CALCIUM CITRATE + D PO) Take 1 tablet by mouth daily.  . Cholecalciferol (VITAMIN D3) 1000 units CAPS Take 5 capsules by mouth daily. Told pt to increase to 5,000 IU per day  . Cranberry 1000 MG CAPS Take 1 capsule by mouth daily.  . fluticasone (FLONASE) 50 MCG/ACT nasal spray Place 1 spray into both nostrils 2 (two) times daily.  [name] ibuprofen (ADVIL,MOTRIN) 600 MG tablet Take 1 tablet (600 mg total) by mouth every 8 (eight) hours as needed.  . loratadine (CLARITIN) 10 MG tablet Take 1 tablet (10 mg total) by mouth daily.  . metoprolol succinate (TOPROL-XL) 25 MG 24 hr tablet Take 1 and 1/2 tablets every day  .  Multiple Vitamin (MULTIVITAMIN WITH MINERALS) TABS tablet Take 1 tablet by mouth daily. Centrum Silver  . Omega-3 Fatty Acids (FISH OIL) 1000 MG CAPS Take 1 capsule by mouth daily.  [name] triamcinolone cream (KENALOG) 0.1 % Apply 1 application topically daily as needed (itching/eczema).      Allergies  Allergen Reactions  . Penicillins Anaphylaxis and Swelling  . Tussin Dm [Guaifenesin-Dm] Other (See Comments)    Dizziness/ Lightheadness  . Zithromax [Azithromycin] Other (See Comments)    Pre-syncope    Social History   Socioeconomic History  . Marital status: Married    Spouse name: Not on file  . Number of children: Not on file  . Years of education: Not on file  . Highest education level: Not on file  Occupational History  . Not on file  Social Needs  . Financial resource strain: Not on file  . Food insecurity:    Worry: Not on file    Inability: Not on file  . Transportation needs:    Medical: Not on file    Non-medical: Not on file  Tobacco Use  . Smoking status: Former Smoker    Last attempt to quit: 01/27/1992    Years since quitting: 25.7  . Smokeless tobacco: Never Used  Substance and Sexual Activity  . Alcohol use: No  . Drug use: No  . Sexual activity: Never  Birth control/protection: None  Lifestyle  . Physical activity:    Days per week: Not on file    Minutes per session: Not on file  . Stress: Not on file  Relationships  . Social connections:    Talks on phone: Not on file    Gets together: Not on file    Attends religious service: Not on file    Active member of club or organization: Not on file    Attends meetings of clubs or organizations: Not on file    Relationship status: Not on file  . Intimate partner violence:    Fear of current or ex partner: Not on file    Emotionally abused: Not on file    Physically abused: Not on file    Forced sexual activity: Not on file  Other Topics Concern  . Not on file  Social History Narrative  . Not on  file     Review of Systems: General: negative for chills, fever, night sweats or weight changes.  Cardiovascular: negative for chest pain, dyspnea on exertion, edema, orthopnea, palpitations, paroxysmal nocturnal dyspnea or shortness of breath Dermatological: negative for rash Respiratory: negative for cough or wheezing Urologic: negative for hematuria Abdominal: negative for nausea, vomiting, diarrhea, bright red blood per rectum, melena, or hematemesis Neurologic: negative for visual changes, syncope, or dizziness All other systems reviewed and are otherwise negative except as noted above.    Blood pressure 136/78, pulse 75, height 5' (1.524 m), weight 149 lb (67.6 kg).  General appearance: alert and no distress Neck: no adenopathy, no carotid bruit, no JVD, supple, symmetrical, trachea midline and thyroid not enlarged, symmetric, no tenderness/mass/nodules Lungs: clear to auscultation bilaterally Heart: regular rate and rhythm, S1, S2 normal, no murmur, click, rub or gallop Extremities: extremities normal, atraumatic, no cyanosis or edema Pulses: 2+ and symmetric Skin: Skin color, texture, turgor normal. No rashes or lesions Neurologic: Alert and oriented X 3, normal strength and tone. Normal symmetric reflexes. Normal coordination and gait  EKG sinus rhythm at 75 with nonspecific ST and T wave changes.  I personally reviewed this EKG.  ASSESSMENT AND PLAN:   Essential hypertension History of essential hypertension blood pressure measured at 136/78 she is on metoprolol.  Current meds at current dosing.  h/o HLD (hyperlipidemia) History of hyperlipidemia not on statin therapy with lipid profile performed 08/14/2016 revealing total cholesterol 210, LDL of 98 and HDL of 93.  Chest pain Ms. Surges has related several month history of atypical back and chest pain with associated shortness of breath as of late night which occasionally awakens her from sleep.  She is had an abnormal GXT  in the past which led to a normal Myoview.  She had a normal 2D echo 4 years ago as well.  We will repeat the echo and obtain a pharmacologic Myoview stress test to rule out an ischemic etiology.      Lorretta Harp MD FACP,FACC,FAHA, [facility] 11/03/2017 9:41 AM

## 2017-11-03 NOTE — Assessment & Plan Note (Signed)
History of hyperlipidemia not on statin therapy with lipid profile performed 08/14/2016 revealing total cholesterol 210, LDL of 98 and HDL of 93.

## 2017-11-08 ENCOUNTER — Other Ambulatory Visit (HOSPITAL_COMMUNITY): Payer: Medicare Other

## 2017-11-09 ENCOUNTER — Other Ambulatory Visit: Payer: Self-pay

## 2017-11-09 ENCOUNTER — Ambulatory Visit (HOSPITAL_COMMUNITY): Payer: Medicare Other | Attending: Cardiovascular Disease

## 2017-11-09 DIAGNOSIS — E7800 Pure hypercholesterolemia, unspecified: Secondary | ICD-10-CM | POA: Insufficient documentation

## 2017-11-09 DIAGNOSIS — R0602 Shortness of breath: Secondary | ICD-10-CM | POA: Insufficient documentation

## 2017-11-09 DIAGNOSIS — I10 Essential (primary) hypertension: Secondary | ICD-10-CM | POA: Insufficient documentation

## 2017-11-09 DIAGNOSIS — I200 Unstable angina: Secondary | ICD-10-CM | POA: Diagnosis not present

## 2017-11-10 ENCOUNTER — Encounter: Payer: Self-pay | Admitting: Family Medicine

## 2017-11-10 ENCOUNTER — Ambulatory Visit (INDEPENDENT_AMBULATORY_CARE_PROVIDER_SITE_OTHER): Payer: Medicare Other | Admitting: Family Medicine

## 2017-11-10 VITALS — BP 162/84 | HR 67 | Ht 60.0 in | Wt 150.0 lb

## 2017-11-10 DIAGNOSIS — E7800 Pure hypercholesterolemia, unspecified: Secondary | ICD-10-CM | POA: Diagnosis not present

## 2017-11-10 DIAGNOSIS — E663 Overweight: Secondary | ICD-10-CM | POA: Diagnosis not present

## 2017-11-10 DIAGNOSIS — R7303 Prediabetes: Secondary | ICD-10-CM

## 2017-11-10 DIAGNOSIS — E538 Deficiency of other specified B group vitamins: Secondary | ICD-10-CM | POA: Diagnosis not present

## 2017-11-10 DIAGNOSIS — R5382 Chronic fatigue, unspecified: Secondary | ICD-10-CM

## 2017-11-10 DIAGNOSIS — I200 Unstable angina: Secondary | ICD-10-CM

## 2017-11-10 DIAGNOSIS — E559 Vitamin D deficiency, unspecified: Secondary | ICD-10-CM

## 2017-11-10 DIAGNOSIS — I471 Supraventricular tachycardia: Secondary | ICD-10-CM | POA: Diagnosis not present

## 2017-11-10 DIAGNOSIS — I10 Essential (primary) hypertension: Secondary | ICD-10-CM | POA: Diagnosis not present

## 2017-11-10 NOTE — Patient Instructions (Signed)

## 2017-11-10 NOTE — Progress Notes (Signed)
Impression and Recommendations:    1. White coat syndrome with diagnosis of hypertension   2. Pure hypercholesterolemia   3. Prediabetes   4. PSVT (paroxysmal supraventricular tachycardia) ([place])   5. Fatigue   6. Overweight (BMI 25.0-29.9)   7. Vitamin D insufficiency   8. Disorder of vitamin B12    HTN -bp stable at home; continue meds -Explained that some people have higher blood pressure at the doctor than at home -Encouraged pt to continue taking ambulatory bp at home -Discussed goal bp ranges and instructed pt contact us if she notices consistent elevation or increase -Explained the importance of diet and exercise in bp control  Chol -Return for lipid panel -Reviewed previous cholesterol levels -Explained to pt that the best way to improve HDL is exercise  -Explained that supplements don't really affect HDL and that exercise makes the largest change -Encouraged pt to continue walking and to work towards meeting AHA guidelines -Discussed the positive impact of exercise on cholesterol levels  PSVT -Pt managed by cardiology; will defer for treatment -Explained that due to history and bp, pt made the right decision to be evaluated for chest pain -Discussed red flag symptoms such as chest pain, chest tightness, SOB and dizziness  Prediabetes -Pt to return for A1C -Reviewed previous A1C  -Explained A1C levels for diabetes and prediabetes -Explained how diet impacts blood glucose and A1C -Encouraged pt to continue making positive dietary choices to help prevent progression into diabetes  Exercise -Explained the need to continue exercising in 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 -Explained that with advancing age, our bodies need more exercise to continue the activities we normally do -Explained how exercise positively impacts bp, cholesterol and A1C -Encouraged pt to take advil or tylenol with muscle tightness if it is bothering her, with ibuprofen  typically working better for muscles  -Educated pt that medication is not the solution, and that she needs to increase her exercise levels to help avoid continued difficulty or irritation   Sleep -Instructed pt to try melatonin supplements to help with sleep issues naturally -Explained that if she has sleep apnea, her body will continue to wake her up due to reflexes -Encouraged pt to contact us if she feels her sleep habits become bothersome -Suggested ways to improve sleep hygiene such as ensuring her room is dark, white noise machines and sleep meditation  Health Maintenance -Pt to return for FBW -Encouraged pt to return for medicare wellness check -Educated pt about medicare requirement for annual wellness checks -Discussed importance of checking screenings for disease such as colonoscopy, mammogram, and blood work    Education and routine counseling performed. Handouts provided.   Medications Discontinued During This Encounter  Medication Reason  . Omega-3 Fatty Acids (FISH OIL) 1000 MG CAPS Patient Preference  . cyclobenzaprine (FLEXERIL) 10 MG tablet No longer needed (for PRN medications)     The patient was counseled, risk factors were discussed, anticipatory guidance given.  Gross side effects, risk and benefits, and alternatives of medications discussed with patient.  Patient is aware that all medications have potential side effects and we are unable to predict every side effect or drug-drug interaction that may occur.  Expresses verbal understanding and consents to current therapy plan and treatment regimen.  Return for FBW near future-lab only; then Medicare Wellness future.  Please see AVS handed out to patient at the end of our visit for further patient instructions/ counseling done pertaining to today's  office visit.    Note:  This document was prepared using Dragon voice recognition software and may include unintentional dictation errors.  This document serves as  a record of services personally performed by Mellody Dance, MD. It was created on her behalf by Georga Bora, a trained medical scribe. The creation of this record is based on the scribe's personal observations and the provider's statements to them.   I have reviewed the above medical documentation for accuracy and completeness and I concur.  Mellody Dance 11/16/17 7:41 AM     Subjective:    HPI: Brittany Harvey is a 72 y.o. female who presents to [place] [facility] today for follow up of [place].    Dental -States she was on antibiotics for 14 days prior to having a tooth pulled and 14 days after as well -Says it upset her stomach but she can't remember what it was -Says the other time she was in the dentist, she just took one pill   HTN: -  Her blood pressure has been controlled at home.   -Pt has been checking it regularly at home, and says its usually in the 120s/80s -States she just had an EKG yesterday with Dr. Alvester Chou because she has been having chest and back pain  -Says they have not called to notify her of any results so far -Pt is having a myocardial profusion study on Tuesday  - Patient reports good compliance with blood pressure medications  - Denies medication S-E   - Smoking Status noted   - She denies new onset of:  exercise intolerance, shortness of breath, dizziness, visual changes, headache, lower extremity swelling or claudication.    Last 3 blood pressure readings in our office are as follows: BP Readings from Last 3 Encounters:  11/10/17 (!) 162/84  11/03/17 136/78  03/15/17 (!) 181/70    Pulse Readings from Last 3 Encounters:  11/10/17 67  11/03/17 75  03/15/17 70   Prediabetes -  She has been working on diet and exercise for diabetes -Last A1C was a 5.6 -Says her sugars have been fine, she usually checks them in the mornings -Says they are never at 100, are usually in the 80's and 90s   Denies  polyuria/polydipsia.  Denies hypo/ hyperglycemia symptoms  Last A1C in the office was:  Lab Results  Component Value Date   HGBA1C 5.5 03/15/2017   HGBA1C 5.6 06/10/2016   HGBA1C 5.8 05/09/2015    Lab Results  Component Value Date   LDLCALC 98 08/14/2016   CREATININE 0.76 08/14/2016    CHOL HPI:  -  She  is not currently managed on a cholesterol medication -Asked if a supplement she brought in would help elevate her HDL -Pt has been walking regularly and started taking the stairs at work  No other s-e  Last lipid panel as follows:  Lab Results  Component Value Date   CHOL 210 (H) 08/14/2016   HDL 93 08/14/2016   LDLCALC 98 08/14/2016   TRIG 93 08/14/2016   CHOLHDL 2.3 08/14/2016    Hepatic Function Latest Ref Rng & Units 08/14/2016 05/09/2015 03/16/2013  Total Protein 6.0 - 8.5 g/dL 7.3 - 7.8  Albumin 3.5 - 4.8 g/dL 4.4 - 3.8  AST 0 - 40 IU/L _0 ALT 0 - 32 IU/L _1 Alk Phosphatase 39 - 117 IU/L 63 68 64  Total Bilirubin 0.0 - 1.2 mg/dL 0.7 - 0.4  Exercise -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 and asked for evaluations -States she took aleve once but she slept for two days so she doesn't take it -usually takes ibuprofen or tylenol for pain  Sleep -Pt states she has issues staying asleep but not falling asleep -Says sometimes she feels like she can't breath and sometimes has to prop herself up -Says she just wakes up in the middle of the night and starts moving around -Ranks her issue as a 1 and isn't worried about having it evaluated, just wanted advice on remedies  Health screening -Pt recently had a mammogram -Is unaware of her last colonoscopy, said she would check her documentation to find out   [facility] Weights   11/10/17 1614  Weight: 150 lb (68 kg)      Patient Care Team    Relationship Specialty Notifications Start End  Mellody Dance, DO PCP - General Family Medicine  08/19/15   Lorretta Harp, MD  PCP - Cardiology Cardiology  11/03/17   Juanita Craver, MD Consulting Physician Gastroenterology  09/24/15   Lorretta Harp, MD Consulting Physician Cardiology  09/24/15   Everlene Farrier, MD Consulting Physician Obstetrics and Gynecology  09/24/15    Comment: Juanda Chance, NP     Lab Results  Component Value Date   CREATININE 0.76 08/14/2016   BUN 15 08/14/2016   NA 144 08/14/2016   K 4.2 08/14/2016   CL 107 (H) 08/14/2016   CO2 21 08/14/2016    Lab Results  Component Value Date   CHOL 210 (H) 08/14/2016   CHOL 218 (A) 05/09/2015   CHOL 194 04/26/2013    Lab Results  Component Value Date   HDL 93 08/14/2016   HDL 90 (A) 05/09/2015   HDL 89 04/26/2013    Lab Results  Component Value Date   LDLCALC 98 08/14/2016   [place] 113 05/09/2015   [place] 92 04/26/2013    Lab Results  Component Value Date   TRIG 93 08/14/2016   TRIG 74 05/09/2015   TRIG 66 04/26/2013    Lab Results  Component Value Date   CHOLHDL 2.3 08/14/2016   CHOLHDL 2.2 04/26/2013    No results found for: LDLDIRECT ===================================================================   Patient Active Problem List   Diagnosis Date Noted  . White coat syndrome with hypertension 03/15/2017    Priority: High  . Prediabetes 09/24/2015    Priority: High  . h/o HLD (hyperlipidemia) 08/20/2015    Priority: High  . Essential hypertension 07/24/2014    Priority: High  . PSVT (paroxysmal supraventricular tachycardia) ([place]) 03/27/2013    Priority: High  . Elevated HDL = 90 09/24/2015    Priority: Medium  . Fatigue 09/24/2015    Priority: Medium  . GERD (gastroesophageal reflux disease) 11/27/2013    Priority: Medium  . Vitamin D deficiency 11/10/2016    Priority: Low  . Environmental and seasonal allergies 06/10/2016    Priority: Low  . Upper back strain 05/07/2016    Priority: Low  . Overweight (BMI 25.0-29.9) 09/24/2015    Priority: Low  . Osteopenia by Dexa (8-9 yrs ago)  08/20/2015     Priority: Low  . Stressful job 08/12/2016  . Tiredness 08/12/2016  . Nonintractable headache 08/12/2016  . Other acute fatigue 08/12/2016  . Benign positional vertigo, bilateral 06/10/2016  . Cough 03/06/2016  . Chest pain 04/25/2013     Past Medical History:  Diagnosis Date  . Anxiety   . Atypical chest  pain   . GERD (gastroesophageal reflux disease)   . Heart palpitations   . Hypertension   . Kidney stones   . PSVT (paroxysmal supraventricular tachycardia) ([place])      Past Surgical History:  Procedure Laterality Date  . CARDIOVASCULAR STRESS TEST  06/19/2009   Post stress myocardial perfusion images show a normal pattern of perfusion in all regions. ECG positive for ischemia. Appears to be a "false-positive" ECG stress test.  . TUBAL LIGATION       Family History  Adopted: Yes     Social History   Substance and Sexual Activity  Drug Use No  ,  Social History   Substance and Sexual Activity  Alcohol Use No  ,  Social History   Tobacco Use  Smoking Status Former Smoker  . Last attempt to quit: 01/27/1992  . Years since quitting: 25.8  Smokeless Tobacco Never Used  ,    Current Outpatient Medications on File Prior to Visit  Medication Sig Dispense Refill  . acetaminophen (TYLENOL) 500 MG tablet Take 500 mg by mouth daily as needed (pain).    [name] aspirin EC 81 MG tablet Take 81 mg by mouth daily.    . Calcium Citrate-Vitamin D (CALCIUM CITRATE + D PO) Take 1 tablet by mouth daily.    . Cholecalciferol (VITAMIN D3) 1000 units CAPS Take 5 capsules by mouth daily. Told pt to increase to 5,000 IU per day    . Cranberry 1000 MG CAPS Take 1 capsule by mouth daily.    . fluticasone (FLONASE) 50 MCG/ACT nasal spray Place 1 spray into both nostrils 2 (two) times daily. 16 g 6  . ibuprofen (ADVIL,MOTRIN) 600 MG tablet Take 1 tablet (600 mg total) by mouth every 8 (eight) hours as needed. 30 tablet 0  . KRILL OIL PO Take 1 capsule by mouth daily.    [name] loratadine  (CLARITIN) 10 MG tablet Take 1 tablet (10 mg total) by mouth daily. 30 tablet 11  . metoprolol succinate (TOPROL-XL) 25 MG 24 hr tablet Take 1 and 1/2 tablets every day 135 tablet 0  . Multiple Vitamin (MULTIVITAMIN WITH MINERALS) TABS tablet Take 1 tablet by mouth daily. Centrum Silver    . triamcinolone cream (KENALOG) 0.1 % Apply 1 application topically daily as needed (itching/eczema).   3   No current facility-administered medications on file prior to visit.      Allergies  Allergen Reactions  . Clindamycin/Lincomycin Nausea Only  . Penicillins Anaphylaxis and Swelling  . Tussin Dm [Guaifenesin-Dm] Other (See Comments)    Dizziness/ Lightheadness  . Zithromax [Azithromycin] Other (See Comments)    Pre-syncope     Review of Systems:   General:  Denies fever, chills Optho/Auditory:   Denies visual changes, blurred vision Respiratory:   Denies SOB, cough, wheeze, DIB  Cardiovascular:   Denies chest pain, palpitations, painful respirations Gastrointestinal:   Denies nausea, vomiting, diarrhea.  Endocrine:     Denies new hot or cold intolerance Musculoskeletal:  Denies joint swelling, gait issues, or new unexplained myalgias/ arthralgias Skin:  Denies rash, suspicious lesions  Neurological:    Denies dizziness, unexplained weakness, numbness  Psychiatric/Behavioral:   Denies mood changes  Objective:    Blood pressure (!) 162/84, pulse 67, height 5' (1.524 m), weight 150 lb (68 kg), SpO2 99 %.  Body mass index is 29.29 kg/m.  General: Well Developed, well nourished, and in no acute distress.  HEENT: Normocephalic, atraumatic, pupils equal round reactive to light, neck supple,  No carotid bruits, no JVD Skin: Warm and dry, cap RF less 2 sec Cardiac: Regular rate and rhythm, S1, S2 WNL's, no murmurs rubs or gallops Respiratory: ECTA B/L, Not using accessory muscles, speaking in full sentences. NeuroM-Sk: Ambulates w/o assistance, moves ext * 4 w/o difficulty, sensation grossly  intact.  Ext: scant edema b/l lower ext Psych: No HI/SI, judgement and insight good, Euthymic mood. Full Affect.

## 2017-11-11 ENCOUNTER — Telehealth (HOSPITAL_COMMUNITY): Payer: Self-pay

## 2017-11-11 ENCOUNTER — Telehealth: Payer: Self-pay | Admitting: Family Medicine

## 2017-11-11 NOTE — Telephone Encounter (Signed)
Patient called states Dr. Jenetta Downer wanted her to cb with name of Rx the dentist prescribed for tooth pain.  --patient states Clindamycin 300 MG tabs ( take 1 tab four times daily  (40 pills prescribed before extraction & (40 pills) after to kill any infection-- patient states they caused severe nausea.  --pt also states BP has returned to normal  114/74  --Forwarding message to medical assistant to share w/ provider.  -glh

## 2017-11-11 NOTE — Telephone Encounter (Signed)
Encounter complete. 

## 2017-11-12 NOTE — Telephone Encounter (Signed)
Please advise if I need to add anything into her chart.  Patient was seen on 11/10/2017. MPulliam, CMA/RT(R)

## 2017-11-15 NOTE — Telephone Encounter (Signed)
Medication has been added to allergy list. MPulliam, CMA/RT(R)

## 2017-11-15 NOTE — Telephone Encounter (Signed)
Yea , add it to her allergy list with side effect of nausea.    Whenever a patient has a side effect of a medication that is a undesirable one that would prohibit [place] from prescribing it again (or make [place] think twice about prescribing it) , that needs to go on a patient's allergy list with the side effect mentioned.

## 2017-11-16 ENCOUNTER — Ambulatory Visit (HOSPITAL_COMMUNITY)
Admission: RE | Admit: 2017-11-16 | Discharge: 2017-11-16 | Disposition: A | Discharge status: Home / Self Care | Payer: Medicare Other | Source: Ambulatory Visit | Attending: Cardiology | Admitting: Cardiology

## 2017-11-16 DIAGNOSIS — R0602 Shortness of breath: Secondary | ICD-10-CM | POA: Diagnosis not present

## 2017-11-16 DIAGNOSIS — I10 Essential (primary) hypertension: Secondary | ICD-10-CM | POA: Diagnosis not present

## 2017-11-16 DIAGNOSIS — I200 Unstable angina: Secondary | ICD-10-CM

## 2017-11-16 DIAGNOSIS — E7800 Pure hypercholesterolemia, unspecified: Secondary | ICD-10-CM | POA: Insufficient documentation

## 2017-11-16 LAB — MYOCARDIAL PERFUSION IMAGING
LV dias vol: 60 mL (ref 46–106)
LV sys vol: 19 mL
Peak HR: 113 {beats}/min
Rest HR: 63 {beats}/min
SDS: 1
SRS: 0
SSS: 1
TID: 1.03

## 2017-11-16 MED ORDER — REGADENOSON 0.4 MG/5ML IV SOLN
0.4000 mg | Freq: Once | INTRAVENOUS | Status: AC
  Administered 2017-11-16: 0.4 mg via INTRAVENOUS

## 2017-11-16 MED ORDER — TECHNETIUM TC 99M TETROFOSMIN IV KIT
11.0000 | PACK | Freq: Once | INTRAVENOUS | Status: AC | PRN
  Administered 2017-11-16: 11 via INTRAVENOUS
  Filled 2017-11-16: qty 11

## 2017-11-16 MED ORDER — TECHNETIUM TC 99M TETROFOSMIN IV KIT
31.9000 | PACK | Freq: Once | INTRAVENOUS | Status: AC | PRN
  Administered 2017-11-16: 31.9 via INTRAVENOUS
  Filled 2017-11-16: qty 32

## 2017-11-18 ENCOUNTER — Telehealth: Payer: Self-pay | Admitting: Cardiovascular Disease

## 2017-11-18 ENCOUNTER — Other Ambulatory Visit (INDEPENDENT_AMBULATORY_CARE_PROVIDER_SITE_OTHER): Payer: Medicare Other

## 2017-11-18 VITALS — BP 156/72 | HR 64

## 2017-11-18 DIAGNOSIS — R5382 Chronic fatigue, unspecified: Secondary | ICD-10-CM | POA: Diagnosis not present

## 2017-11-18 DIAGNOSIS — I471 Supraventricular tachycardia: Secondary | ICD-10-CM | POA: Diagnosis not present

## 2017-11-18 DIAGNOSIS — E7800 Pure hypercholesterolemia, unspecified: Secondary | ICD-10-CM

## 2017-11-18 DIAGNOSIS — E559 Vitamin D deficiency, unspecified: Secondary | ICD-10-CM

## 2017-11-18 DIAGNOSIS — R7303 Prediabetes: Secondary | ICD-10-CM | POA: Diagnosis not present

## 2017-11-18 DIAGNOSIS — Z23 Encounter for immunization: Secondary | ICD-10-CM

## 2017-11-18 DIAGNOSIS — E538 Deficiency of other specified B group vitamins: Secondary | ICD-10-CM | POA: Diagnosis not present

## 2017-11-18 DIAGNOSIS — I10 Essential (primary) hypertension: Secondary | ICD-10-CM | POA: Diagnosis not present

## 2017-11-18 DIAGNOSIS — E663 Overweight: Secondary | ICD-10-CM

## 2017-11-18 NOTE — Telephone Encounter (Signed)
Brittany Harvey with Dr. Hershal Coria office advised that the pt may have the flu vaccine from a cardio standpoint.. They were making sure since she just had a stress test.

## 2017-11-18 NOTE — Progress Notes (Signed)
Pt here for influenza vaccine.  Screening questionnaire reviewed, VIS provided to patient, and any/all patient questions answered.  T. Nelson, CMA  

## 2017-11-19 LAB — CBC WITH DIFFERENTIAL/PLATELET
Basophils Absolute: 0.1 10*3/uL (ref 0.0–0.2)
Basos: 1 %
EOS (ABSOLUTE): 0.2 10*3/uL (ref 0.0–0.4)
Eos: 3 %
Hematocrit: 38.6 % (ref 34.0–46.6)
Hemoglobin: 13.2 g/dL (ref 11.1–15.9)
Immature Grans (Abs): 0 10*3/uL (ref 0.0–0.1)
Immature Granulocytes: 0 %
Lymphocytes Absolute: 2.7 10*3/uL (ref 0.7–3.1)
Lymphs: 39 %
MCH: 30.1 pg (ref 26.6–33.0)
MCHC: 34.2 g/dL (ref 31.5–35.7)
MCV: 88 fL (ref 79–97)
Monocytes Absolute: 0.4 10*3/uL (ref 0.1–0.9)
Monocytes: 6 %
Neutrophils Absolute: 3.5 10*3/uL (ref 1.4–7.0)
Neutrophils: 51 %
Platelets: 259 10*3/uL (ref 150–450)
RBC: 4.39 x10E6/uL (ref 3.77–5.28)
RDW: 12.7 % (ref 12.3–15.4)
WBC: 7 10*3/uL (ref 3.4–10.8)

## 2017-11-19 LAB — COMPREHENSIVE METABOLIC PANEL
ALT: 19 IU/L (ref 0–32)
AST: 22 IU/L (ref 0–40)
Albumin/Globulin Ratio: 1.5 (ref 1.2–2.2)
Albumin: 4.4 g/dL (ref 3.5–4.8)
Alkaline Phosphatase: 60 IU/L (ref 39–117)
BUN/Creatinine Ratio: 14 (ref 12–28)
BUN: 10 mg/dL (ref 8–27)
Bilirubin Total: 0.6 mg/dL (ref 0.0–1.2)
CO2: 24 mmol/L (ref 20–29)
Calcium: 9.6 mg/dL (ref 8.7–10.3)
Chloride: 104 mmol/L (ref 96–106)
Creatinine, Ser: 0.69 mg/dL (ref 0.57–1.00)
GFR calc Af Amer: 101 mL/min/{1.73_m2} (ref >59)
GFR calc non Af Amer: 88 mL/min/{1.73_m2} (ref >59)
Globulin, Total: 2.9 g/dL (ref 1.5–4.5)
Glucose: 90 mg/dL (ref 65–99)
Potassium: 4 mmol/L (ref 3.5–5.2)
Sodium: 142 mmol/L (ref 134–144)
Total Protein: 7.3 g/dL (ref 6.0–8.5)

## 2017-11-19 LAB — T4, FREE: Free T4: 1.16 ng/dL (ref 0.82–1.77)

## 2017-11-19 LAB — LIPID PANEL
Chol/HDL Ratio: 2.5 ratio (ref 0.0–4.4)
Cholesterol, Total: 216 mg/dL — ABNORMAL HIGH (ref 100–199)
HDL: 86 mg/dL (ref >39)
LDL Calculated: 109 mg/dL — ABNORMAL HIGH (ref 0–99)
Triglycerides: 104 mg/dL (ref 0–149)
VLDL Cholesterol Cal: 21 mg/dL (ref 5–40)

## 2017-11-19 LAB — HEMOGLOBIN A1C
Est. average glucose Bld gHb Est-mCnc: 111 mg/dL
Hgb A1c MFr Bld: 5.5 % (ref 4.8–5.6)

## 2017-11-19 LAB — VITAMIN B12: Vitamin B-12: 821 pg/mL (ref 232–1245)

## 2017-11-19 LAB — TSH: TSH: 1.73 u[IU]/mL (ref 0.450–4.500)

## 2017-11-19 LAB — VITAMIN D 25 HYDROXY (VIT D DEFICIENCY, FRACTURES): Vit D, 25-Hydroxy: 50.7 ng/mL (ref 30.0–100.0)

## 2017-12-04 ENCOUNTER — Other Ambulatory Visit: Payer: Self-pay | Admitting: Cardiovascular Disease

## 2017-12-07 NOTE — Telephone Encounter (Signed)
Rx request sent to pharmacy.  

## 2017-12-08 ENCOUNTER — Telehealth: Payer: Self-pay | Admitting: Cardiovascular Disease

## 2017-12-08 NOTE — Telephone Encounter (Signed)
New message:        *STAT* If patient is at the pharmacy, call can be transferred to refill team.   1. Which medications need to be refilled? (please list name of each medication and dose if known) metoprolol succinate (TOPROL-XL) 25 MG 24 hr tablet  2. Which pharmacy/location (including street and city if local pharmacy) is medication to be sent to? [place], [place]  3. Do they need a 30 day or 90 day supply? [place]

## 2017-12-22 ENCOUNTER — Ambulatory Visit (INDEPENDENT_AMBULATORY_CARE_PROVIDER_SITE_OTHER): Payer: Medicare Other | Admitting: Family Medicine

## 2017-12-22 ENCOUNTER — Encounter: Payer: Self-pay | Admitting: Family Medicine

## 2017-12-22 VITALS — BP 161/81 | HR 67 | Temp 98.2°F | Ht 61.0 in | Wt 147.5 lb

## 2017-12-22 DIAGNOSIS — Z23 Encounter for immunization: Secondary | ICD-10-CM

## 2017-12-22 DIAGNOSIS — Z0000 Encounter for general adult medical examination without abnormal findings: Secondary | ICD-10-CM

## 2017-12-22 MED ORDER — ZOSTER VAC RECOMB ADJUVANTED 50 MCG/0.5ML IM SUSR: 0.5000 mL | Freq: Once | INTRAMUSCULAR | 0 refills | Status: AC

## 2017-12-22 NOTE — Patient Instructions (Signed)
Please follow up 2-3 months and bring BP log.    HAPPY HOLIDAYS!!  Preventive Care for Adults, Female  A healthy lifestyle and preventive care can promote health and wellness. Preventive health guidelines for women include the following key practices.   A routine yearly physical is a good way to check with your health care provider about your health and preventive screening. It is a chance to share any concerns and updates on your health and to receive a thorough exam.   Visit your dentist for a routine exam and preventive care every 6 months. Brush your teeth twice a day and floss once a day. Good oral hygiene prevents tooth decay and gum disease.   The frequency of eye exams is based on your age, health, family medical history, use of contact lenses, and other factors. Follow your health care provider's recommendations for frequency of eye exams.   Eat a healthy diet. Foods like vegetables, fruits, whole grains, low-fat dairy products, and lean protein foods contain the nutrients you need without too many calories. Decrease your intake of foods high in solid fats, added sugars, and salt. Eat the right amount of calories for you.Get information about a proper diet from your health care provider, if necessary.   Regular physical exercise is one of the most important things you can do for your health. Most adults should get at least 150 minutes of moderate-intensity exercise (any activity that increases your heart rate and causes you to sweat) each week. In addition, most adults need muscle-strengthening exercises on 2 or more days a week.   Maintain a healthy weight. The body mass index (BMI) is a screening tool to identify possible weight problems. It provides an estimate of body fat based on height and weight. Your health care provider can find your BMI, and can help you achieve or maintain a healthy weight.For adults 20 years and older:   - A BMI below 18.5 is considered  underweight.   - A BMI of 18.5 to 24.9 is normal.   - A BMI of 25 to 29.9 is considered overweight.   - A BMI of 30 and above is considered obese.   Maintain normal blood lipids and cholesterol levels by exercising and minimizing your intake of trans and saturated fats.  Eat a balanced diet with plenty of fruit and vegetables. Blood tests for lipids and cholesterol should begin at age 62 and be repeated every 5 years minimum.  If your lipid or cholesterol levels are high, you are over 40, or you are at high risk for heart disease, you may need your cholesterol levels checked more frequently.Ongoing high lipid and cholesterol levels should be treated with medicines if diet and exercise are not working.   If you smoke, find out from your health care provider how to quit. If you do not use tobacco, do not start.   Lung cancer screening is recommended for adults aged 74-80 years who are at high risk for developing lung cancer because of a history of smoking. A yearly low-dose CT scan of the lungs is recommended for people who have at least a 30-pack-year history of smoking and are a current smoker or have quit within the past 15 years. A pack year of smoking is smoking an average of 1 pack of cigarettes a day for 1 year (for example: 1 pack a day for 30 years or 2 packs a day for 15 years). Yearly screening should continue until the smoker has stopped smoking  for at least 15 years. Yearly screening should be stopped for people who develop a health problem that would prevent them from having lung cancer treatment.   If you are pregnant, do not drink alcohol. If you are breastfeeding, be very cautious about drinking alcohol. If you are not pregnant and choose to drink alcohol, do not have more than 1 drink per day. One drink is considered to be 12 ounces (355 mL) of beer, 5 ounces (148 mL) of wine, or 1.5 ounces (44 mL) of liquor.   Avoid use of street drugs. Do not share needles with anyone. Ask for  help if you need support or instructions about stopping the use of drugs.   High blood pressure causes heart disease and increases the risk of stroke. Your blood pressure should be checked at least yearly.  Ongoing high blood pressure should be treated with medicines if weight loss and exercise do not work.   If you are 34-65 years old, ask your health care provider if you should take aspirin to prevent strokes.   Diabetes screening involves taking a blood sample to check your fasting blood sugar level. This should be done once every 3 years, after age 59, if you are within normal weight and without risk factors for diabetes. Testing should be considered at a younger age or be carried out more frequently if you are overweight and have at least 1 risk factor for diabetes.   Breast cancer screening is essential preventive care for women. You should practice "breast self-awareness."  This means understanding the normal appearance and feel of your breasts and may include breast self-examination.  Any changes detected, no matter how small, should be reported to a health care provider.  Women in their 41s and 30s should have a clinical breast exam (CBE) by a health care provider as part of a regular health exam every 1 to 3 years.  After age 110, women should have a CBE every year.  Starting at age 71, women should consider having a mammogram (breast X-ray test) every year.  Women who have a family history of breast cancer should talk to their health care provider about genetic screening.  Women at a high risk of breast cancer should talk to their health care providers about having an MRI and a mammogram every year.   -Breast cancer gene (BRCA)-related cancer risk assessment is recommended for women who have family members with BRCA-related cancers. BRCA-related cancers include breast, ovarian, tubal, and peritoneal cancers. Having family members with these cancers may be associated with an increased risk for  harmful changes (mutations) in the breast cancer genes BRCA1 and BRCA2. Results of the assessment will determine the need for genetic counseling and BRCA1 and BRCA2 testing.   The Pap test is a screening test for cervical cancer. A Pap test can show cell changes on the cervix that might become cervical cancer if left untreated. A Pap test is a procedure in which cells are obtained and examined from the lower end of the uterus (cervix).   - Women should have a Pap test starting at age 35.   - Between ages 26 and 84, Pap tests should be repeated every 2 years.   - Beginning at age 81, you should have a Pap test every 3 years as long as the past 3 Pap tests have been normal.   - Some women have medical problems that increase the chance of getting cervical cancer. Talk to your health care provider about  these problems. It is especially important to talk to your health care provider if a new problem develops soon after your last Pap test. In these cases, your health care provider may recommend more frequent screening and Pap tests.   - The above recommendations are the same for women who have or have not gotten the vaccine for human papillomavirus (HPV).   - If you had a hysterectomy for a problem that was not cancer or a condition that could lead to cancer, then you no longer need Pap tests. Even if you no longer need a Pap test, a regular exam is a good idea to make sure no other problems are starting.   - If you are between ages 18 and 17 years, and you have had normal Pap tests going back 10 years, you no longer need Pap tests. Even if you no longer need a Pap test, a regular exam is a good idea to make sure no other problems are starting.   - If you have had past treatment for cervical cancer or a condition that could lead to cancer, you need Pap tests and screening for cancer for at least 20 years after your treatment.   - If Pap tests have been discontinued, risk factors (such as a new sexual  partner) need to be reassessed to determine if screening should be resumed.   - The HPV test is an additional test that may be used for cervical cancer screening. The HPV test looks for the virus that can cause the cell changes on the cervix. The cells collected during the Pap test can be tested for HPV. The HPV test could be used to screen women aged 69 years and older, and should be used in women of any age who have unclear Pap test results. After the age of 47, women should have HPV testing at the same frequency as a Pap test.   Colorectal cancer can be detected and often prevented. Most routine colorectal cancer screening begins at the age of 13 years and continues through age 46 years. However, your health care provider may recommend screening at an earlier age if you have risk factors for colon cancer. On a yearly basis, your health care provider may provide home test kits to check for hidden blood in the stool.  Use of a small camera at the end of a tube, to directly examine the colon (sigmoidoscopy or colonoscopy), can detect the earliest forms of colorectal cancer. Talk to your health care provider about this at age 40, when routine screening begins. Direct exam of the colon should be repeated every 5 -10 years through age 26 years, unless early forms of pre-cancerous polyps or small growths are found.   People who are at an increased risk for hepatitis B should be screened for this virus. You are considered at high risk for hepatitis B if:  -You were born in a country where hepatitis B occurs often. Talk with your health care provider about which countries are considered high risk.  - Your parents were born in a high-risk country and you have not received a shot to protect against hepatitis B (hepatitis B vaccine).  - You have HIV or AIDS.  - You use needles to inject street drugs.  - You live with, or have sex with, someone who has Hepatitis B.  - You get hemodialysis treatment.  - You  take certain medicines for conditions like cancer, organ transplantation, and autoimmune conditions.   Hepatitis C  blood testing is recommended for all people born from [birth year] through 1964 and any individual with known risks for hepatitis C.   Practice safe sex. Use condoms and avoid high-risk sexual practices to reduce the spread of sexually transmitted infections (STIs). STIs include gonorrhea, chlamydia, syphilis, trichomonas, herpes, HPV, and human immunodeficiency virus (HIV). Herpes, HIV, and HPV are viral illnesses that have no cure. They can result in disability, cancer, and death. Sexually active women aged 45 years and younger should be checked for chlamydia. Older women with new or multiple partners should also be tested for chlamydia. Testing for other STIs is recommended if you are sexually active and at increased risk.   Osteoporosis is a disease in which the bones lose minerals and strength with aging. This can result in serious bone fractures or breaks. The risk of osteoporosis can be identified using a bone density scan. Women ages 5 years and over and women at risk for fractures or osteoporosis should discuss screening with their health care providers. Ask your health care provider whether you should take a calcium supplement or vitamin D to There are also several preventive steps women can take to avoid osteoporosis and resulting fractures or to keep osteoporosis from worsening. -->Recommendations include:  Eat a balanced diet high in fruits, vegetables, calcium, and vitamins.  Get enough calcium. The recommended total intake of is 1,200 mg daily; for best absorption, if taking supplements, divide doses into 250-500 mg doses throughout the day. Of the two types of calcium, calcium carbonate is best absorbed when taken with food but calcium citrate can be taken on an empty stomach.  Get enough vitamin D. [employer] and the [place] recommend at least 1,000 IU per  day for women age 34 and over who are at risk of vitamin D deficiency. Vitamin D deficiency can be caused by inadequate sun exposure (for example, those who live in [place]).  Avoid alcohol and smoking. Heavy alcohol intake (more than 7 drinks per week) increases the risk of falls and hip fracture and women smokers tend to lose bone more rapidly and have lower bone mass than nonsmokers. Stopping smoking is one of the most important changes women can make to improve their health and decrease risk for disease.  Be physically active every day. Weight-bearing exercise (for example, fast walking, hiking, jogging, and weight training) may strengthen bones or slow the rate of bone loss that comes with aging. Balancing and muscle-strengthening exercises can reduce the risk of falling and fracture.  Consider therapeutic medications. Currently, several types of effective drugs are available. Healthcare providers can recommend the type most appropriate for each woman.  Eliminate environmental factors that may contribute to accidents. Falls cause nearly 90% of all osteoporotic fractures, so reducing this risk is an important bone-health strategy. Measures include ample lighting, removing obstructions to walking, using nonskid rugs on floors, and placing mats and/or grab bars in showers.  Be aware of medication side effects. Some common medicines make bones weaker. These include a type of steroid drug called glucocorticoids used for arthritis and asthma, some antiseizure drugs, certain sleeping pills, treatments for endometriosis, and some cancer drugs. An overactive thyroid gland or using too much thyroid hormone for an underactive thyroid can also be a problem. If you are taking these medicines, talk to your doctor about what you can do to help protect your bones.reduce the rate of osteoporosis.    Menopause can be associated with physical symptoms and risks. Hormone replacement therapy is  available to  decrease symptoms and risks. You should talk to your health care provider about whether hormone replacement therapy is right for you.   Use sunscreen. Apply sunscreen liberally and repeatedly throughout the day. You should seek shade when your shadow is shorter than you. Protect yourself by wearing long sleeves, pants, a wide-brimmed hat, and sunglasses year round, whenever you are outdoors.   Once a month, do a whole body skin exam, using a mirror to look at the skin on your back. Tell your health care provider of new moles, moles that have irregular borders, moles that are larger than a pencil eraser, or moles that have changed in shape or color.   -Stay current with required vaccines (immunizations).   Influenza vaccine. All adults should be immunized every year.  Tetanus, diphtheria, and acellular pertussis (Td, Tdap) vaccine. Pregnant women should receive 1 dose of Tdap vaccine during each pregnancy. The dose should be obtained regardless of the length of time since the last dose. Immunization is preferred during the 27th 36th week of gestation. An adult who has not previously received Tdap or who does not know her vaccine status should receive 1 dose of Tdap. This initial dose should be followed by tetanus and diphtheria toxoids (Td) booster doses every 10 years. Adults with an unknown or incomplete history of completing a 3-dose immunization series with Td-containing vaccines should begin or complete a primary immunization series including a Tdap dose. Adults should receive a Td booster every 10 years.  Varicella vaccine. An adult without evidence of immunity to varicella should receive 2 doses or a second dose if she has previously received 1 dose. Pregnant females who do not have evidence of immunity should receive the first dose after pregnancy. This first dose should be obtained before leaving the health care facility. The second dose should be obtained 4 8 weeks after the first  dose.  Human papillomavirus (HPV) vaccine. Females aged 14 26 years who have not received the vaccine previously should obtain the 3-dose series. The vaccine is not recommended for use in pregnant females. However, pregnancy testing is not needed before receiving a dose. If a female is found to be pregnant after receiving a dose, no treatment is needed. In that case, the remaining doses should be delayed until after the pregnancy. Immunization is recommended for any person with an immunocompromised condition through the age of 3 years if she did not get any or all doses earlier. During the 3-dose series, the second dose should be obtained 4 8 weeks after the first dose. The third dose should be obtained 24 weeks after the first dose and 16 weeks after the second dose.  Zoster vaccine. One dose is recommended for adults aged 24 years or older unless certain conditions are present.  Measles, mumps, and rubella (MMR) vaccine. Adults born before [birth year] generally are considered immune to measles and mumps. Adults born in [birth year] or later should have 1 or more doses of MMR vaccine unless there is a contraindication to the vaccine or there is laboratory evidence of immunity to each of the three diseases. A routine second dose of MMR vaccine should be obtained at least 28 days after the first dose for students attending postsecondary schools, health care workers, or international travelers. People who received inactivated measles vaccine or an unknown type of measles vaccine during 1963 1967 should receive 2 doses of MMR vaccine. People who received inactivated mumps vaccine or an unknown type of mumps vaccine before 1979  and are at high risk for mumps infection should consider immunization with 2 doses of MMR vaccine. For females of childbearing age, rubella immunity should be determined. If there is no evidence of immunity, females who are not pregnant should be vaccinated. If there is no evidence of immunity, females  who are pregnant should delay immunization until after pregnancy. Unvaccinated health care workers born before [birth year] who lack laboratory evidence of measles, mumps, or rubella immunity or laboratory confirmation of disease should consider measles and mumps immunization with 2 doses of MMR vaccine or rubella immunization with 1 dose of MMR vaccine.  Pneumococcal 13-valent conjugate (PCV13) vaccine. When indicated, a person who is uncertain of her immunization history and has no record of immunization should receive the PCV13 vaccine. An adult aged 10 years or older who has certain medical conditions and has not been previously immunized should receive 1 dose of PCV13 vaccine. This PCV13 should be followed with a dose of pneumococcal polysaccharide (PPSV23) vaccine. The PPSV23 vaccine dose should be obtained at least 8 weeks after the dose of PCV13 vaccine. An adult aged 33 years or older who has certain medical conditions and previously received 1 or more doses of PPSV23 vaccine should receive 1 dose of PCV13. The PCV13 vaccine dose should be obtained 1 or more years after the last PPSV23 vaccine dose.  Pneumococcal polysaccharide (PPSV23) vaccine. When PCV13 is also indicated, PCV13 should be obtained first. All adults aged 48 years and older should be immunized. An adult younger than age 31 years who has certain medical conditions should be immunized. Any person who resides in a nursing home or long-term care facility should be immunized. An adult smoker should be immunized. People with an immunocompromised condition and certain other conditions should receive both PCV13 and PPSV23 vaccines. People with human immunodeficiency virus (HIV) infection should be immunized as soon as possible after diagnosis. Immunization during chemotherapy or radiation therapy should be avoided. Routine use of PPSV23 vaccine is not recommended for American Indians, [place] Natives, or people younger than 65 years unless there are  medical conditions that require PPSV23 vaccine. When indicated, people who have unknown immunization and have no record of immunization should receive PPSV23 vaccine. One-time revaccination 5 years after the first dose of PPSV23 is recommended for people aged 40 64 years who have chronic kidney failure, nephrotic syndrome, asplenia, or immunocompromised conditions. People who received 1 2 doses of PPSV23 before age 33 years should receive another dose of PPSV23 vaccine at age 109 years or later if at least 5 years have passed since the previous dose. Doses of PPSV23 are not needed for people immunized with PPSV23 at or after age 49 years.  Meningococcal vaccine. Adults with asplenia or persistent complement component deficiencies should receive 2 doses of quadrivalent meningococcal conjugate (MenACWY-D) vaccine. The doses should be obtained at least 2 months apart. Microbiologists working with certain meningococcal bacteria, [employer] recruits, people at risk during an outbreak, and people who travel to or live in countries with a high rate of meningitis should be immunized. A first-year college student up through age 56 years who is living in a residence hall should receive a dose if she did not receive a dose on or after her 16th birthday. Adults who have certain high-risk conditions should receive one or more doses of vaccine.  Hepatitis A vaccine. Adults who wish to be protected from this disease, have certain high-risk conditions, work with hepatitis A-infected animals, work in hepatitis A research labs, or  travel to or work in countries with a high rate of hepatitis A should be immunized. Adults who were previously unvaccinated and who anticipate close contact with an international adoptee during the first 60 days after arrival in the [place] from a country with a high rate of hepatitis A should be immunized.  Hepatitis B vaccine.  Adults who wish to be protected from this disease, have certain  high-risk conditions, may be exposed to blood or other infectious body fluids, are household contacts or sex partners of hepatitis B positive people, are clients or workers in certain care facilities, or travel to or work in countries with a high rate of hepatitis B should be immunized.  Haemophilus influenzae type b (Hib) vaccine. A previously unvaccinated person with asplenia or sickle cell disease or having a scheduled splenectomy should receive 1 dose of Hib vaccine. Regardless of previous immunization, a recipient of a hematopoietic stem cell transplant should receive a 3-dose series 6 12 months after her successful transplant. Hib vaccine is not recommended for adults with HIV infection.  Preventive Services / Frequency Ages 38 to 39years  Blood pressure check.** / Every 1 to 2 years.  Lipid and cholesterol check.** / Every 5 years beginning at age 87.  Clinical breast exam.** / Every 3 years for women in their 61s and 60s.  BRCA-related cancer risk assessment.** / For women who have family members with a BRCA-related cancer (breast, ovarian, tubal, or peritoneal cancers).  Pap test.** / Every 2 years from ages 59 through 35. Every 3 years starting at age 10 through age 15 or 57 with a history of 3 consecutive normal Pap tests.  HPV screening.** / Every 3 years from ages 64 through ages 73 to 61 with a history of 3 consecutive normal Pap tests.  Hepatitis C blood test.** / For any individual with known risks for hepatitis C.  Skin self-exam. / Monthly.  Influenza vaccine. / Every year.  Tetanus, diphtheria, and acellular pertussis (Tdap, Td) vaccine.** / Consult your health care provider. Pregnant women should receive 1 dose of Tdap vaccine during each pregnancy. 1 dose of Td every 10 years.  Varicella vaccine.** / Consult your health care provider. Pregnant females who do not have evidence of immunity should receive the first dose after pregnancy.  HPV vaccine. / 3 doses over 6  months, if 28 and younger. The vaccine is not recommended for use in pregnant females. However, pregnancy testing is not needed before receiving a dose.  Measles, mumps, rubella (MMR) vaccine.** / You need at least 1 dose of MMR if you were born in 1957 or later. You may also need a 2nd dose. For females of childbearing age, rubella immunity should be determined. If there is no evidence of immunity, females who are not pregnant should be vaccinated. If there is no evidence of immunity, females who are pregnant should delay immunization until after pregnancy.  Pneumococcal 13-valent conjugate (PCV13) vaccine.** / Consult your health care provider.  Pneumococcal polysaccharide (PPSV23) vaccine.** / 1 to 2 doses if you smoke cigarettes or if you have certain conditions.  Meningococcal vaccine.** / 1 dose if you are age 75 to 30 years and a Market researcher living in a residence hall, or have one of several medical conditions, you need to get vaccinated against meningococcal disease. You may also need additional booster doses.  Hepatitis A vaccine.** / Consult your health care provider.  Hepatitis B vaccine.** / Consult your health care provider.  Haemophilus influenzae  type b (Hib) vaccine.** / Consult your health care provider.  Ages 5 to 64years  Blood pressure check.** / Every 1 to 2 years.  Lipid and cholesterol check.** / Every 5 years beginning at age 98 years.  Lung cancer screening. / Every year if you are aged 61 80 years and have a 30-pack-year history of smoking and currently smoke or have quit within the past 15 years. Yearly screening is stopped once you have quit smoking for at least 15 years or develop a health problem that would prevent you from having lung cancer treatment.  Clinical breast exam.** / Every year after age 12 years.  BRCA-related cancer risk assessment.** / For women who have family members with a BRCA-related cancer (breast, ovarian, tubal, or  peritoneal cancers).  Mammogram.** / Every year beginning at age 66 years and continuing for as long as you are in good health. Consult with your health care provider.  Pap test.** / Every 3 years starting at age 75 years through age 58 or 46 years with a history of 3 consecutive normal Pap tests.  HPV screening.** / Every 3 years from ages 85 years through ages 62 to 53 years with a history of 3 consecutive normal Pap tests.  Fecal occult blood test (FOBT) of stool. / Every year beginning at age 10 years and continuing until age 64 years. You may not need to do this test if you get a colonoscopy every 10 years.  Flexible sigmoidoscopy or colonoscopy.** / Every 5 years for a flexible sigmoidoscopy or every 10 years for a colonoscopy beginning at age 53 years and continuing until age 70 years.  Hepatitis C blood test.** / For all people born from [birth year] through 1964 and any individual with known risks for hepatitis C.  Skin self-exam. / Monthly.  Influenza vaccine. / Every year.  Tetanus, diphtheria, and acellular pertussis (Tdap/Td) vaccine.** / Consult your health care provider. Pregnant women should receive 1 dose of Tdap vaccine during each pregnancy. 1 dose of Td every 10 years.  Varicella vaccine.** / Consult your health care provider. Pregnant females who do not have evidence of immunity should receive the first dose after pregnancy.  Zoster vaccine.** / 1 dose for adults aged 57 years or older.  Measles, mumps, rubella (MMR) vaccine.** / You need at least 1 dose of MMR if you were born in 1957 or later. You may also need a 2nd dose. For females of childbearing age, rubella immunity should be determined. If there is no evidence of immunity, females who are not pregnant should be vaccinated. If there is no evidence of immunity, females who are pregnant should delay immunization until after pregnancy.  Pneumococcal 13-valent conjugate (PCV13) vaccine.** / Consult your health care  provider.  Pneumococcal polysaccharide (PPSV23) vaccine.** / 1 to 2 doses if you smoke cigarettes or if you have certain conditions.  Meningococcal vaccine.** / Consult your health care provider.  Hepatitis A vaccine.** / Consult your health care provider.  Hepatitis B vaccine.** / Consult your health care provider.  Haemophilus influenzae type b (Hib) vaccine.** / Consult your health care provider.  Ages 60 years and over  Blood pressure check.** / Every 1 to 2 years.  Lipid and cholesterol check.** / Every 5 years beginning at age 69 years.  Lung cancer screening. / Every year if you are aged 13 80 years and have a 30-pack-year history of smoking and currently smoke or have quit within the past 15 years. Yearly screening is stopped  once you have quit smoking for at least 15 years or develop a health problem that would prevent you from having lung cancer treatment.  Clinical breast exam.** / Every year after age 1 years.  BRCA-related cancer risk assessment.** / For women who have family members with a BRCA-related cancer (breast, ovarian, tubal, or peritoneal cancers).  Mammogram.** / Every year beginning at age 51 years and continuing for as long as you are in good health. Consult with your health care provider.  Pap test.** / Every 3 years starting at age 13 years through age 65 or 50 years with 3 consecutive normal Pap tests. Testing can be stopped between 65 and 70 years with 3 consecutive normal Pap tests and no abnormal Pap or HPV tests in the past 10 years.  HPV screening.** / Every 3 years from ages 58 years through ages 67 or 45 years with a history of 3 consecutive normal Pap tests. Testing can be stopped between 65 and 70 years with 3 consecutive normal Pap tests and no abnormal Pap or HPV tests in the past 10 years.  Fecal occult blood test (FOBT) of stool. / Every year beginning at age 58 years and continuing until age 49 years. You may not need to do this test if you  get a colonoscopy every 10 years.  Flexible sigmoidoscopy or colonoscopy.** / Every 5 years for a flexible sigmoidoscopy or every 10 years for a colonoscopy beginning at age 39 years and continuing until age 74 years.  Hepatitis C blood test.** / For all people born from [birth year] through 1964 and any individual with known risks for hepatitis C.  Osteoporosis screening.** / A one-time screening for women ages 60 years and over and women at risk for fractures or osteoporosis.  Skin self-exam. / Monthly.  Influenza vaccine. / Every year.  Tetanus, diphtheria, and acellular pertussis (Tdap/Td) vaccine.** / 1 dose of Td every 10 years.  Varicella vaccine.** / Consult your health care provider.  Zoster vaccine.** / 1 dose for adults aged 7 years or older.  Pneumococcal 13-valent conjugate (PCV13) vaccine.** / Consult your health care provider.  Pneumococcal polysaccharide (PPSV23) vaccine.** / 1 dose for all adults aged 81 years and older.  Meningococcal vaccine.** / Consult your health care provider.  Hepatitis A vaccine.** / Consult your health care provider.  Hepatitis B vaccine.** / Consult your health care provider.  Haemophilus influenzae type b (Hib) vaccine.** / Consult your health care provider. ** Family history and personal history of risk and conditions may change your health care provider's recommendations. Document Released: 03/10/2001 Document Revised: 11/02/2012  [facility] Patient Information 2014 [place], [place].   EXERCISE AND DIET:  We recommended that you start or continue a regular exercise program for good health. Regular exercise means any activity that makes your heart beat faster and makes you sweat.  We recommend exercising at least 30 minutes per day at least 3 days a week, preferably 5.  We also recommend a diet low in fat and sugar / carbohydrates.  Inactivity, poor dietary choices and obesity can cause diabetes, heart attack, stroke, and kidney damage, among  others.     ALCOHOL AND SMOKING:  Women should limit their alcohol intake to no more than 7 drinks/beers/glasses of wine (combined, not each!) per week. Moderation of alcohol intake to this level decreases your risk of breast cancer and liver damage.  ( And of course, no recreational drugs are part of a healthy lifestyle.)  Also, you should not be  smoking at all or even being exposed to second hand smoke. Most people know smoking can cause cancer, and various heart and lung diseases, but did you know it also contributes to weakening of your bones?  Aging of your skin?  Yellowing of your teeth and nails?   CALCIUM AND VITAMIN D:  Adequate intake of calcium and Vitamin D are recommended.  The recommendations for exact amounts of these supplements seem to change often, but generally speaking 600 mg of calcium (either carbonate or citrate) and 800 units of Vitamin D per day seems prudent. Certain women may benefit from higher intake of Vitamin D.  If you are among these women, your doctor will have told you during your visit.     PAP SMEARS:  Pap smears, to check for cervical cancer or precancers,  have traditionally been done yearly, although recent scientific advances have shown that most women can have pap smears less often.  However, every woman still should have a physical exam from her gynecologist or primary care physician every year. It will include a breast check, inspection of the vulva and vagina to check for abnormal growths or skin changes, a visual exam of the cervix, and then an exam to evaluate the size and shape of the uterus and ovaries.  And after 71 years of age, a rectal exam is indicated to check for rectal cancers. We will also provide age appropriate advice regarding health maintenance, like when you should have certain vaccines, screening for sexually transmitted diseases, bone density testing, colonoscopy, mammograms, etc.    MAMMOGRAMS:  All women over 81 years old should have  a yearly mammogram. Many facilities now offer a "3D" mammogram, which may cost around $50 extra out of pocket. If possible,  we recommend you accept the option to have the 3D mammogram performed.  It both reduces the number of women who will be called back for extra views which then turn out to be normal, and it is better than the routine mammogram at detecting truly abnormal areas.     COLONOSCOPY:  Colonoscopy to screen for colon cancer is recommended for all women at age 77.  We know, you hate the idea of the prep.  We agree, BUT, having colon cancer and not knowing it is worse!!  Colon cancer so often starts as a polyp that can be seen and removed at colonscopy, which can quite literally save your life!  And if your first colonoscopy is normal and you have no family history of colon cancer, most women don't have to have it again for 10 years.  Once every ten years, you can do something that may end up saving your life, right?  We will be happy to help you get it scheduled when you are ready.  Be sure to check your insurance coverage so you understand how much it will cost.  It may be covered as a preventative service at no cost, but you should check your particular policy.

## 2017-12-22 NOTE — Progress Notes (Signed)
Subjective:   Brittany Harvey is a 72 y.o. female who presents for Medicare Annual (Subsequent) preventive examination.  CC: Medicare Wellness Visit  HPI: Brittany Harvey is a 72 y.o. female who presents to [place] [facility] today a yearly [place] Maintenance Visit.  Health Maintenance Summary Reviewed and updated, unless pt declines services.  Colonoscopy:  Up to date.   Last colonoscopy in August 2015; had two polyps, repeat in August 2020. Tobacco History Reviewed:   Y; smoked "years ago."  Quit in 1995. Alcohol:    No concerns, no excessive use. Exercise Habits:   Not meeting AHA guidelines.  Continues working. STD concerns:  None. Drug Use:   None Birth control method:  Post-menopausal. Menses regular:   Post-menopausal. Lumps or breast concerns:   No. Breast Cancer Family History:   No Bone/ DEXA scan:   Up to date.  Last DEXA in 2018.  General Well-Being Overall, patient is feeling well.  She spends the appointment joking good-naturedly about her visit.  Not experiencing any health concerns at present.  GYN Health Mammogram last done this year; patient notes she goes every year.  "Only things I don't get every year is the paps."  Continues to go to GYN for mammogram, bone density, bimanual exams, etc.  Hearing Concerns States that for her, "sometimes people have soft voices," and sometimes she has trouble hearing them.  She does not experience this problem with people who speak loudly, but only those who speak softly.   Activities of Daily Living In your present state of health, do you have difficulty performing the following activities?  1- Driving - no 2- Managing money - no 3- Feeding yourself - no 4- Getting from the bed to the chair - no 5- Climbing a flight of stairs - no 6- Preparing food and eating - no 7- Bathing or showering - no 8- Getting dressed - no 9- Getting to the toilet - no 10- Using the toilet - no 11-  Moving around from place to place - no  Patient states that she feels at home.   6CIT Screen 12/22/2017  What Year? 0 points  What month? 0 points  What time? 0 points  Count back from 20 0 points  Months in reverse 0 points  Repeat phrase 0 points  Total Score 0    Fall Risk  12/22/2017 01/27/2017 11/10/2016 08/20/2015  Falls in the past year? 0 No No No   Depression screen [facility] 2/9 12/22/2017 11/10/2017 03/15/2017 01/27/2017 11/10/2016  Decreased Interest 0 0 0 0 0  Down, Depressed, Hopeless 0 0 0 0 0  PHQ - 2 Score 0 0 0 0 0  Altered sleeping 1 0 0 0 -  Tired, decreased energy 1 0 0 0 -  Change in appetite 1 0 0 0 -  Feeling bad or failure about yourself  0 0 0 0 -  Trouble concentrating 0 0 0 0 -  Moving slowly or fidgety/restless 0 0 0 0 -  Suicidal thoughts 0 0 0 0 -  PHQ-9 Score 3 0 0 0 -  Difficult doing work/chores Not difficult at all Not difficult at all - Not difficult at all -   Current Exercise Habits: Home exercise routine, Type of exercise: walking;stretching;Other - see comments(running), Frequency (Times/Week): 7, Intensity: Moderate    Functional Status Survey: Is the patient deaf or have difficulty hearing?: Yes Does the patient have difficulty seeing, even when  wearing glasses/contacts?: No Does the patient have difficulty concentrating, remembering, or making decisions?: No Does the patient have difficulty walking or climbing stairs?: No Does the patient have difficulty dressing or bathing?: No Does the patient have difficulty doing errands alone such as visiting a doctor's office or shopping?: No        Objective:     Vitals: BP (!) 161/81   Pulse 67   Temp 98.2 F (36.8 C)   Ht 5\' 1"  (1.549 m)   Wt 147 lb 8 oz (66.9 kg)   SpO2 98%   BMI 27.87 kg/m   Body mass index is 27.87 kg/m.  Advanced Directives 08/12/2016 08/20/2015 09/09/2014 11/19/2013 11/19/2013 04/26/2013  Does Patient Have a Medical Advance Directive? Yes Yes No No No Patient has  advance directive, copy not in chart  Type of Advance Directive Healthcare Power of [place];Living will  Would patient like information on creating a medical advance directive? - - - No - patient declined information No - patient declined information -    Tobacco Social History   Tobacco Use  Smoking Status Former Smoker  . Last attempt to quit: 01/27/1992  . Years since quitting: 25.9  Smokeless Tobacco Never Used     Counseling given: Not Answered   Clinical Intake:                       Past Medical History:  Diagnosis Date  . Anxiety   . Atypical chest pain   . GERD (gastroesophageal reflux disease)   . Heart palpitations   . Hypertension   . Kidney stones   . PSVT (paroxysmal supraventricular tachycardia) ([place])    Past Surgical History:  Procedure Laterality Date  . CARDIOVASCULAR STRESS TEST  06/19/2009   Post stress myocardial perfusion images show a normal pattern of perfusion in all regions. ECG positive for ischemia. Appears to be a "false-positive" ECG stress test.  . TUBAL LIGATION     Family History  Adopted: Yes   Social History   Socioeconomic History  . Marital status: Married    Spouse name: Not on file  . Number of children: Not on file  . Years of education: Not on file  . Highest education level: Not on file  Occupational History  . Not on file  Social Needs  . Financial resource strain: Not on file  . Food insecurity:    Worry: Not on file    Inability: Not on file  . Transportation needs:    Medical: Not on file    Non-medical: Not on file  Tobacco Use  . Smoking status: Former Smoker    Last attempt to quit: 01/27/1992    Years since quitting: 25.9  . Smokeless tobacco: Never Used  Substance and Sexual Activity  . Alcohol use: No  . Drug use: No  . Sexual activity: Never    Birth control/protection: None  Lifestyle  . Physical activity:    Days per week:  Not on file    Minutes per session: Not on file  . Stress: Not on file  Relationships  . Social connections:    Talks on phone: Not on file    Gets together: Not on file    Attends religious service: Not on file    Active member of club or organization: Not on file    Attends meetings of clubs or organizations: Not on file  Relationship status: Not on file  Other Topics Concern  . Not on file  Social History Narrative  . Not on file    Outpatient Encounter Medications as of 12/22/2017  Medication Sig  . acetaminophen (TYLENOL) 500 MG tablet Take 500 mg by mouth daily as needed (pain).  [name] aspirin EC 81 MG tablet Take 81 mg by mouth daily.  . Calcium Citrate-Vitamin D (CALCIUM CITRATE + D PO) Take 1 tablet by mouth daily.  . Cholecalciferol (VITAMIN D3) 1000 units CAPS Take 5 capsules by mouth daily. Told pt to increase to 5,000 IU per day  . Cranberry 1000 MG CAPS Take 1 capsule by mouth daily.  . fluticasone (FLONASE) 50 MCG/ACT nasal spray Place 1 spray into both nostrils 2 (two) times daily.  [name] ibuprofen (ADVIL,MOTRIN) 600 MG tablet Take 1 tablet (600 mg total) by mouth every 8 (eight) hours as needed.  . loratadine (CLARITIN) 10 MG tablet Take 1 tablet (10 mg total) by mouth daily.  . metoprolol succinate (TOPROL-XL) 25 MG 24 hr tablet Take 1.5 tablets (37.5 mg total) by mouth daily.  . Multiple Vitamin (MULTIVITAMIN WITH MINERALS) TABS tablet Take 1 tablet by mouth daily. Centrum Silver  . triamcinolone cream (KENALOG) 0.1 % Apply 1 application topically daily as needed (itching/eczema).   . Zoster Vaccine Adjuvanted [facility]) injection Inject 0.5 mLs into the muscle once for 1 dose.  . [DISCONTINUED] KRILL OIL PO Take 1 capsule by mouth daily.   No facility-administered encounter medications on file as of 12/22/2017.     Activities of Daily Living In your present state of health, do you have any difficulty performing the following activities: 12/22/2017  Hearing? Y   Vision? N  Difficulty concentrating or making decisions? N  Walking or climbing stairs? N  Dressing or bathing? N  Doing errands, shopping? N  Some recent data might be hidden    Patient Care Team: Mellody Dance, DO as PCP - General (Family Medicine) Lorretta Harp, MD as PCP - Cardiology (Cardiology) Juanita Craver, MD as Consulting Physician (Gastroenterology) Lorretta Harp, MD as Consulting Physician (Cardiology) Everlene Farrier, MD as Consulting Physician (Obstetrics and Gynecology)    Assessment:   This is a routine wellness examination for [place].  Exercise Activities and Dietary recommendations Current Exercise Habits: Home exercise routine, Type of exercise: walking;stretching;Other - see comments(running), Frequency (Times/Week): 7, Intensity: Moderate  Goals   None     Fall Risk Fall Risk  12/22/2017 01/27/2017 11/10/2016 08/20/2015  Falls in the past year? 0 No No No   Is the patient's home free of loose throw rugs in walkways, pet beds, electrical cords, etc?   yes      Grab bars in the bathroom? yes      Handrails on the stairs?   yes      Adequate lighting?   yes  Timed Get Up and Go performed: passed  Depression Screen PHQ 2/9 Scores 12/22/2017 11/10/2017 03/15/2017 01/27/2017  PHQ - 2 Score 0 0 0 0  PHQ- 9 Score 3 0 0 0     Cognitive Function     6CIT Screen 12/22/2017  What Year? 0 points  What month? 0 points  What time? 0 points  Count back from 20 0 points  Months in reverse 0 points  Repeat phrase 0 points  Total Score 0    Immunization History  Administered Date(s) Administered  . Influenza, High Dose Seasonal PF 10/31/2015, 10/29/2016, 11/18/2017  . Pneumococcal Conjugate-13 05/10/2015  .  Pneumococcal Polysaccharide-23 01/18/2013  . Tdap 12/09/2010, 09/30/2012    Qualifies for Shingles Vaccine? Vaccine was sent to the pharmacy  Screening Tests Health Maintenance  Topic Date Due  . MAMMOGRAM  05/19/2018  . TETANUS/TDAP   10/01/2022  . COLONOSCOPY  09/23/2023  . INFLUENZA VACCINE  Completed  . DEXA SCAN  Completed  . Hepatitis C Screening  Completed  . PNA vac Low Risk Adult  Completed    Cancer Screenings: Lung: Low Dose CT Chest recommended if Age 23-80 years, 30 pack-year currently smoking OR have quit w/in 15years. Patient does not qualify. Breast:  Up to date on Mammogram? Yes   Up to date of Bone Density/Dexa? Yes Colorectal: 09/22/2013  Additional Screenings: Hepatitis C Screening: 07/17/2014     Plan:     Impression and Recommendations:     1) Anticipatory Guidance: Discussed importance of wearing a seatbelt while driving, not texting while driving; sunscreen when outside along with yearly skin surveillance; eating a well balanced and modest diet; physical activity at least 25 minutes per day or 150 min/ week of moderate to intense activity.  2) Immunizations / Screenings / Labs: All immunizations and screenings that patient agrees to, are up-to-date per recommendations or will be updated today.  Patient understands the needs for q 26mo dental and yearly vision screens which pt will schedule independently. Obtain CBC, CMP, HgA1c, Lipid panel, TSH and vit D when fasting if not already done recently.   - Patient up to date on all screenings aside from shingles vaccine.  - Need for shingles vaccine.  Information provided today.  - Patient will continue to visit GYN for screenings such as mammograms, bone density exams, bimanual exams.  Recommendations discussed at length with patient today.  Patient will continue to follow up with specialty for GYN screening as recommended.  - Reviewed guidelines and recommendations for colonoscopy with patient today.  Last colonoscopy in August 2015, repeat in August 2020.  - Reviewed recommendations for TDAP with patient today.   I have personally reviewed and noted the following in the patient's chart:   . Medical and social history . Use of  alcohol, tobacco or illicit drugs  . Current medications and supplements . Functional ability and status . Nutritional status . Physical activity . Advanced directives . List of other physicians . Hospitalizations, surgeries, and ER visits in previous 12 months . Vitals . Screenings to include cognitive, depression, and falls . Referrals and appointments  In addition, I have reviewed and discussed with patient certain preventive protocols, quality metrics, and best practice recommendations. A written personalized care plan for preventive services as well as general preventive health recommendations were provided to patient.     Mellody Dance, DO  12/22/2017

## 2018-02-01 ENCOUNTER — Ambulatory Visit: Payer: Medicare Other | Admitting: Cardiology

## 2018-02-09 ENCOUNTER — Ambulatory Visit (INDEPENDENT_AMBULATORY_CARE_PROVIDER_SITE_OTHER): Payer: Medicare Other | Admitting: Cardiology

## 2018-02-09 ENCOUNTER — Encounter: Payer: Self-pay | Admitting: Cardiology

## 2018-02-09 DIAGNOSIS — I471 Supraventricular tachycardia: Secondary | ICD-10-CM

## 2018-02-09 DIAGNOSIS — I10 Essential (primary) hypertension: Secondary | ICD-10-CM | POA: Diagnosis not present

## 2018-02-09 DIAGNOSIS — R079 Chest pain, unspecified: Secondary | ICD-10-CM | POA: Diagnosis not present

## 2018-02-09 NOTE — Assessment & Plan Note (Signed)
H/O PSVT 2011 and 2015- she saw Dr Rayann Heman in 2015 and RFA offered but she never had this done

## 2018-02-09 NOTE — Assessment & Plan Note (Addendum)
Normal LVF by echo Controlled with Toprol

## 2018-02-09 NOTE — Patient Instructions (Signed)
Medication Instructions:  Your physician recommends that you continue on your current medications as directed. Please refer to the Current Medication list given to you today. If you need a refill on your cardiac medications before your next appointment, please call your pharmacy.   Lab work: None  If you have labs (blood work) drawn today and your tests are completely normal, you will receive your results only by: [name] MyChart Message (if you have MyChart) OR . A paper copy in the mail If you have any lab test that is abnormal or we need to change your treatment, we will call you to review the results.  Testing/Procedures: non2  Follow-Up: At [facility], you and your health needs are our priority.  As part of our continuing mission to provide you with exceptional heart care, we have created designated Provider Care Teams.  These Care Teams include your primary Cardiologist (physician) and Advanced Practice Providers (APPs -  Physician Assistants and Nurse Practitioners) who all work together to provide you with the care you need, when you need it. You will need a follow up appointment in 10 months.  Please call our office 2 months (August 2020) in advance to schedule this appointment.  You may see Quay Burow, MD or one of the following Advanced Practice Providers on your designated Care Team:   [name], PA-C Roby Lofts, [place] . Sande Rives, PA-C  Any Other Special Instructions Will Be Listed Below (If Applicable).

## 2018-02-09 NOTE — Assessment & Plan Note (Signed)
Atypical chest pain- Myoview low risk 11/17/17 Echo normal 11/09/17

## 2018-02-09 NOTE — Progress Notes (Signed)
02/09/2018 Brittany Harvey   Jun 05, 1945  [id number]  Primary Physician Brittany Scarlet Neoma Laming, DO Primary Cardiologist: Dr Gwenlyn Found  HPI: Patient is a 73 year old female, still working at [employer], who we have seen in the past for PSVT.  She had PSVT in 2011.  Myoview at that time was low risk.  In 2015 she was seen for chest pain, stress study then was low risk.  Later in 2015 she had recurrent PSVT and converted with adenosine in the emergency room.  She was put on beta-blocker and seen by Dr. Rayann Heman as an outpatient.  He offered ablation but the patient ultimately declined this.  Most recently she was seen by Dr. Gwenlyn Found in October 2019 for chest pain.  A nuclear stress and echo were performed, both were essentially normal.  She is in the office today for routine follow-up. She has had no further chest pain.  In retrospect she thinks this may have been from moving a patient and it was musculoskeletal.  She has not had recurrent PSVT.   Current Outpatient Medications  Medication Sig Dispense Refill  . acetaminophen (TYLENOL) 500 MG tablet Take 500 mg by mouth daily as needed (pain).    [name] aspirin EC 81 MG tablet Take 81 mg by mouth daily.    . Calcium Citrate-Vitamin D (CALCIUM CITRATE + D PO) Take 1 tablet by mouth daily.    . Cholecalciferol (VITAMIN D3) 1000 units CAPS Take 5 capsules by mouth daily. Told pt to increase to 5,000 IU per day    . Cranberry 1000 MG CAPS Take 1 capsule by mouth daily.    . fluticasone (FLONASE) 50 MCG/ACT nasal spray Place 1 spray into both nostrils 2 (two) times daily. 16 g 6  . ibuprofen (ADVIL,MOTRIN) 600 MG tablet Take 1 tablet (600 mg total) by mouth every 8 (eight) hours as needed. 30 tablet 0  . loratadine (CLARITIN) 10 MG tablet Take 1 tablet (10 mg total) by mouth daily. 30 tablet 11  . metoprolol succinate (TOPROL-XL) 25 MG 24 hr tablet Take 1.5 tablets (37.5 mg total) by mouth daily. 135 tablet 0  . Multiple Vitamin (MULTIVITAMIN WITH MINERALS) TABS  tablet Take 1 tablet by mouth daily. Centrum Silver    . triamcinolone cream (KENALOG) 0.1 % Apply 1 application topically daily as needed (itching/eczema).   3   No current facility-administered medications for this visit.     Allergies  Allergen Reactions  . Clindamycin/Lincomycin Nausea Only  . Penicillins Anaphylaxis and Swelling  . Tussin Dm [Guaifenesin-Dm] Other (See Comments)    Dizziness/ Lightheadness  . Zithromax [Azithromycin] Other (See Comments)    Pre-syncope    Past Medical History:  Diagnosis Date  . Anxiety   . Atypical chest pain   . GERD (gastroesophageal reflux disease)   . Heart palpitations   . Hypertension   . Kidney stones   . PSVT (paroxysmal supraventricular tachycardia) (HCC)     Social History   Socioeconomic History  . Marital status: Married    Spouse name: Not on file  . Number of children: Not on file  . Years of education: Not on file  . Highest education level: Not on file  Occupational History  . Not on file  Social Needs  . Financial resource strain: Not on file  . Food insecurity:    Worry: Not on file    Inability: Not on file  . Transportation needs:    Medical: Not on file  Non-medical: Not on file  Tobacco Use  . Smoking status: Former Smoker    Last attempt to quit: 01/27/1992    Years since quitting: 26.0  . Smokeless tobacco: Never Used  Substance and Sexual Activity  . Alcohol use: No  . Drug use: No  . Sexual activity: Never    Birth control/protection: None  Lifestyle  . Physical activity:    Days per week: Not on file    Minutes per session: Not on file  . Stress: Not on file  Relationships  . Social connections:    Talks on phone: Not on file    Gets together: Not on file    Attends religious service: Not on file    Active member of club or organization: Not on file    Attends meetings of clubs or organizations: Not on file    Relationship status: Not on file  . Intimate partner violence:    Fear of  current or ex partner: Not on file    Emotionally abused: Not on file    Physically abused: Not on file    Forced sexual activity: Not on file  Other Topics Concern  . Not on file  Social History Narrative  . Not on file     Family History  Adopted: Yes     Review of Systems: General: negative for chills, fever, night sweats or weight changes.  Cardiovascular: negative for chest pain, dyspnea on exertion, edema, orthopnea, palpitations, paroxysmal nocturnal dyspnea or shortness of breath Dermatological: negative for rash Respiratory: negative for cough or wheezing Urologic: negative for hematuria Abdominal: negative for nausea, vomiting, diarrhea, bright red blood per rectum, melena, or hematemesis Neurologic: negative for visual changes, syncope, or dizziness All other systems reviewed and are otherwise negative except as noted above.    Blood pressure 126/74, pulse 71, height 5\' 1"  (1.549 m), weight 144 lb (65.3 kg), SpO2 97 %.  General appearance: alert, cooperative and no distress Neck: no carotid bruit and no JVD Lungs: clear to auscultation bilaterally Heart: regular rate and rhythm Extremities: no edema Skin: Skin color, texture, turgor normal. No rashes or lesions Neurologic: Grossly normal   ASSESSMENT AND PLAN:   Chest pain Atypical chest pain- Myoview low risk 11/17/17 Echo normal 11/09/17  Essential hypertension Normal LVF by echo Controlled with Toprol  PSVT (paroxysmal supraventricular tachycardia) ([place]) H/O PSVT 2011 and 2015- she saw Dr Rayann Heman in 2015 and RFA offered but she never had this done   PLAN  Same Rx- f/u with Dr Gwenlyn Found in Oct 2020 for one year visit.  Brittany Ransom PA-C 02/09/2018 9:20 AM

## 2018-03-16 ENCOUNTER — Ambulatory Visit (INDEPENDENT_AMBULATORY_CARE_PROVIDER_SITE_OTHER): Payer: Medicare Other | Admitting: Family Medicine

## 2018-03-16 ENCOUNTER — Encounter: Payer: Self-pay | Admitting: Family Medicine

## 2018-03-16 VITALS — BP 140/74 | HR 60 | Temp 98.8°F | Ht 61.0 in | Wt 147.9 lb

## 2018-03-16 DIAGNOSIS — R7303 Prediabetes: Secondary | ICD-10-CM

## 2018-03-16 DIAGNOSIS — Z5320 Procedure and treatment not carried out because of patient's decision for unspecified reasons: Secondary | ICD-10-CM | POA: Diagnosis not present

## 2018-03-16 DIAGNOSIS — I10 Essential (primary) hypertension: Secondary | ICD-10-CM | POA: Diagnosis not present

## 2018-03-16 DIAGNOSIS — E785 Hyperlipidemia, unspecified: Secondary | ICD-10-CM | POA: Diagnosis not present

## 2018-03-16 NOTE — Patient Instructions (Addendum)
You are due for your next Medicare Wellness visit on 12/15/2018.   Guidelines for a Low Cholesterol, Low Saturated Fat Diet   Fats - Limit total intake of fats and oils. - Avoid butter, stick margarine, shortening, lard, palm and coconut oils. - Limit mayonnaise, salad dressings, gravies and sauces, unless they are homemade with low-fat ingredients. - Limit chocolate. - Choose low-fat and nonfat products, such as low-fat mayonnaise, low-fat or non-hydrogenated peanut butter, low-fat or fat-free salad dressings and nonfat gravy. - Use vegetable oil, such as canola or olive oil. - Look for margarine that does not contain trans fatty acids. - Use nuts in moderate amounts. - Read ingredient labels carefully to determine both amount and type of fat present in foods. Limit saturated and trans fats! - Avoid high-fat processed and convenience foods.  Meats and Meat Alternatives - Choose fish, chicken, [place] and lean meats. - Use dried beans, peas, lentils and tofu. - Limit egg yolks to three to four per week. - If you eat red meat, limit to no more than three servings per week and choose loin or round cuts. - Avoid fatty meats, such as bacon, sausage, franks, luncheon meats and ribs. - Avoid all organ meats, including liver.  Dairy - Choose nonfat or low-fat milk, yogurt and cottage cheese. - Most cheeses are high in fat. Choose cheeses made from non-fat milk, such as mozzarella and ricotta cheese. - Choose light or fat-free cream cheese and sour cream. - Avoid cream and sauces made with cream.  Fruits and Vegetables - Eat a wide variety of fruits and vegetables. - Use lemon juice, vinegar or "mist" olive oil on vegetables. - Avoid adding sauces, fat or oil to vegetables.  Breads, Cereals and Grains - Choose whole-grain breads, cereals, pastas and rice. - Avoid high-fat snack foods, such as granola, cookies, pies, pastries, doughnuts and croissants.  Cooking Tips - Avoid deep fried  foods. - Trim visible fat off meats and remove skin from poultry before cooking. - Bake, broil, boil, poach or roast poultry, fish and lean meats. - Drain and discard fat that drains out of meat as you cook it. - Add little or no fat to foods. - Use vegetable oil sprays to grease pans for cooking or baking. - Steam vegetables. - Use herbs or no-oil marinades to flavor foods.   Top Ten Foods for Health  1. Water Drink at least 8 to 12 cups of water daily. Consume half of your body weight in pounds, is the amount of water in ounces to drink daily.  Ie: a 200lb person = 100 oz water daily  2. Dark Green Vegetables Eat dark green vegetables at least three to four times a week. Good options include broccoli, peppers, brussel sprouts and leafy greens like kale and spinach.  3. Whole Grains Whole grains should be included in your diet at least two to three times daily. Look for whole wheat flour, rye, oatmeal, barley, amaranth, quinoa or a multigrain. A good source of fiber includes 3 to 4 grams of fiber per serving. A great source has 5 or more grams of fiber per serving.  4. Beans and Lentils Try to eat a bean-based meal at least once a week. Try to add legumes, including beans and lentils, to soups, stews, casseroles, salads and dips or eat them plain.  5. Fish Try to eat two to three serving of fish a week. A serving consists of 3 to 4 ounces of cooked fish. Good choices  are salmon, trout, herring, bluefish, sardines and tuna.  6. Berries Include two to four servings of fruit in your diet each day. Try to eat berries such as raspberries, blueberries, blackberries and strawberries.  7. Winter Squash Eat butternut and acorn squash as well as other richly pigmented dark orange and green colored vegetables like sweet potato, cantaloupe and mango.  8. Soy 25 grams of soy protein a day is recommended as part of a low-fat diet to help lower cholesterol levels. Try tofu, soymilk, edamame  soybeans, tempeh and texturized vegetable protein (TVP).  9. Flaxseed, Nuts and Seeds Add 1 to 2 tablespoons of ground flaxseed or other seeds to food each day or include a moderate amount of nuts -- 1/4 cup -- in your daily diet.  10. Organic Yogurt Men and women between 85 and 81 years of age need 1000 milligrams of calcium a day and 1200 milligrams if 22 or older. Eat calcium-rich foods such as nonfat or low-fat dairy products three to four times a day. Include organic choices.

## 2018-03-16 NOTE — Progress Notes (Signed)
Impression and Recommendations:    1. Prediabetes   2. White coat syndrome with hypertension   3. Hyperlipidemia, unspecified hyperlipidemia type- >er 20 yr risk   4. Statin declined     1. Prediabetes - Counseled patient on prevention of disease and discussed prudent dietary and lifestyle modifications as first line.  Importance of low carb diet discussed with patient in addition to regular exercise.   - Advised patient that the only blood sugar readings that matter medically are our fasting blood sugar and 2-hour postprandial.  - Once weekly, check FBS and 2 hours after the biggest meal of your day.  Keep log and bring in next OV for my review.   Also, if you ever feel dizzy or poorly, please check your blood pressure and blood sugar, as one or the other could be the cause of your symptoms.  - Patient should also check her blood sugar after eating especially good or especially bad.  2. White Coat Syndrome with Hypertension - Stable at home.  Patient continues to have White Coat Syndrome in office. - Continue treatment plan as recommended.  See med list below. - Patient tolerating meds well without complication.  Denies S-E  - Advised patient to bring in her blood pressure cuff at any time to compare it against cuffs here at clinic.  - Lifestyle changes such as dash diet and engaging in a regular exercise program discussed with patient.  Educational handouts provided  - Ambulatory BP monitoring encouraged. Keep log and bring in next OV.  - Reviewed symptoms of low blood pressure with patient today.  3. PSVT, Followed by Cardiology - Discussed need for patient to continue to obtain management and screenings with established cardiology specialist.  Educated patient at length about the critical importance of keeping specialty care up to date.  - Advised patient to update her cardiologist regarding her blood pressure and symptoms, and ask whether or not she needs to go down on  her medication.  - Patient knows to let her cardiologist know if she experiences any symptoms of low BP.  4. Hyperlipidemia - Greater than 20% ASCVD risk, statin declined - Patient was told to start atorvastatin on 11/22/2017, and follow-up to discuss it with [place], but patient was lost to follow-up.  - Advised patient again today to begin statin management.  Patient again declined.  The 10-year ASCVD risk score Mikey Bussing DC Brooke Bonito., et al., 2013) is: 20.6%   Values used to calculate the score:     Age: 73 years     Sex: Female     Is Non-Hispanic African American: Yes     Diabetic: No     Tobacco smoker: No     Systolic Blood Pressure: 570 mmHg     Is BP treated: Yes     HDL Cholesterol: 86 mg/dL     Total Cholesterol: 216 mg/dL   Education and routine counseling performed. Handouts provided.  5. Lifestyle & Preventative Health Maintenance - Advised patient to continue working toward exercising to improve overall mental, physical, and emotional health.    - American Heart Association guidelines for healthy diet, basically Mediterranean diet, and exercise guidelines of 30 minutes 5 days per week or more discussed in detail.  - Encouraged patient to engage in daily physical activity, especially a formal exercise routine.  Recommended that the patient eventually strive for at least 150 minutes of moderate cardiovascular activity per week according to guidelines established by the [facility].   -  Healthy dietary habits encouraged, including low-carb, and high amounts of lean protein in diet.   - Patient should also consume adequate amounts of water.  - Health counseling performed.  All questions answered.  The patient was counseled, risk factors were discussed, anticipatory guidance given.  Gross side effects, risk and benefits, and alternatives of medications discussed with patient.  Patient is aware that all medications have potential side effects and we are unable to predict every side effect or  drug-drug interaction that may occur.  Expresses verbal understanding and consents to current therapy plan and treatment regimen.  Return if symptoms worsen or fail to improve, for 4-33mof/up BP, pre-dm, HLD, healthy habits.  Please see AVS handed out to patient at the end of our visit for further patient instructions/ counseling done pertaining to today's office visit.    Note:  This document was prepared using Dragon voice recognition software and may include unintentional dictation errors.  This document serves as a record of services personally performed by DMellody Dance DO. It was created on her behalf by KToni Amend a trained medical scribe. The creation of this record is based on the scribe's personal observations and the provider's statements to them.   I have reviewed the above medical documentation for accuracy and completeness and I concur.  DMellody Dance DO 03/17/2018 2:13 PM        Subjective:    HPI: Brittany ABDELAZIZis a 73y.o. female [facility]    Prediabetes Patient has been checking her blood sugar at home.  It is within normal limits.  Thinks she sometimes feels a little bit lightheaded when her blood pressure or blood sugar is low.  Hypertension & White Coat Syndrome  -  Her blood pressure has been controlled at home.  Pt has been checking her blood pressure regularly.  Blood Pressure at Home:  111/75, 98/61, 117/66, 119/75, 99/66, 118/70, 123/69.  - Patient reports good compliance with blood pressure medications and treatment plan.  - Denies medication S-E   - Smoking Status noted   - She denies new onset of: chest pain, exercise intolerance, shortness of breath, dizziness, visual changes, headache, lower extremity swelling or claudication.   Last 3 blood pressure readings in our office are as follows: BP Readings from Last 3 Encounters:  03/16/18  140/74  02/09/18 126/74  12/22/17 (!) 161/81    Pulse Readings from Last 3 Encounters:  03/16/18 60  02/09/18 71  12/22/17 67    Filed Weights   03/16/18 1019  Weight: 147 lb 14.4 oz (67.1 kg)   1. 73y.o. female here for cholesterol follow-up.   Patient states she prefers to handle her cholesterol herself instead of being managed on medication.  Says she's been trying to eat better, eating a lot of greens.  Says "I love greens now, and I wasn't eating stuff like that."  Says she's trying to watch to eat less fatty stuff.  Says "I'm not a meat person."  Says "I just was eating everything."   - Smoking Status noted; former smoker, quit in 1994.  - She denies new onset of: chest pain, exercise intolerance, shortness of breath, dizziness, visual changes, headache, lower extremity swelling or claudication.   The cholesterol last visit was:  Lab Results  Component Value Date   CHOL 216 (H) 11/18/2017   HDL 86 11/18/2017   LDLCALC 109 (H) 11/18/2017  TRIG 104 11/18/2017   CHOLHDL 2.5 11/18/2017    Hepatic Function Latest Ref Rng & Units 11/18/2017 08/14/2016 05/09/2015  Total Protein 6.0 - 8.5 g/dL 7.3 7.3 -  Albumin 3.5 - 4.8 g/dL 4.4 4.4 -  AST 0 - 40 IU/L _0 ALT 0 - 32 IU/L _1 Alk Phosphatase 39 - 117 IU/L 60 63 68  Total Bilirubin 0.0 - 1.2 mg/dL 0.6 0.7 -     Patient Care Team    Relationship Specialty Notifications Start End  Mellody Dance, DO PCP - General Family Medicine  08/19/15   Lorretta Harp, MD PCP - Cardiology Cardiology  11/03/17   Juanita Craver, MD Consulting Physician Gastroenterology  09/24/15   Lorretta Harp, MD Consulting Physician Cardiology  09/24/15   Everlene Farrier, MD Consulting Physician Obstetrics and Gynecology  09/24/15    Comment: Juanda Chance, NP     Lab Results  Component Value Date   CREATININE 0.69 11/18/2017   BUN 10 11/18/2017   NA 142 11/18/2017   K 4.0 11/18/2017   CL 104 11/18/2017   CO2 24 11/18/2017      Lab Results  Component Value Date   CHOL 216 (H) 11/18/2017   CHOL 210 (H) 08/14/2016   CHOL 218 (A) 05/09/2015    Lab Results  Component Value Date   HDL 86 11/18/2017   HDL 93 08/14/2016   HDL 90 (A) 05/09/2015    Lab Results  Component Value Date   LDLCALC 109 (H) 11/18/2017   LDLCALC 98 08/14/2016   LDLCALC 113 05/09/2015    Lab Results  Component Value Date   TRIG 104 11/18/2017   TRIG 93 08/14/2016   TRIG 74 05/09/2015    Lab Results  Component Value Date   CHOLHDL 2.5 11/18/2017   CHOLHDL 2.3 08/14/2016   CHOLHDL 2.2 04/26/2013    No results found for: LDLDIRECT ===================================================================   Patient Active Problem List   Diagnosis Date Noted  . White coat syndrome with hypertension 03/15/2017    Priority: High  . Prediabetes 09/24/2015    Priority: High  . Hyperlipidemia 08/20/2015    Priority: High  . Essential hypertension 07/24/2014    Priority: High  . PSVT (paroxysmal supraventricular tachycardia) ([place]) 03/27/2013    Priority: High  . Elevated HDL = 90 09/24/2015    Priority: Medium  . Fatigue 09/24/2015    Priority: Medium  . GERD (gastroesophageal reflux disease) 11/27/2013    Priority: Medium  . Vitamin D deficiency 11/10/2016    Priority: Low  . Environmental and seasonal allergies 06/10/2016    Priority: Low  . Upper back strain 05/07/2016    Priority: Low  . Overweight (BMI 25.0-29.9) 09/24/2015    Priority: Low  . Osteopenia by Dexa (8-9 yrs ago)  08/20/2015    Priority: Low  . Statin declined 03/16/2018  . Stressful job 08/12/2016  . Tiredness 08/12/2016  . Nonintractable headache 08/12/2016  . Other acute fatigue 08/12/2016  . Benign positional vertigo, bilateral 06/10/2016  . Cough 03/06/2016  . Chest pain 04/25/2013     Past Medical History:  Diagnosis Date  . Anxiety   . Atypical chest pain   . GERD (gastroesophageal reflux disease)   . Heart palpitations   .  Hypertension   . Kidney stones   . PSVT (paroxysmal supraventricular tachycardia) ([place])      Past Surgical History:  Procedure Laterality Date  . CARDIOVASCULAR STRESS TEST  06/19/2009  Post stress myocardial perfusion images show a normal pattern of perfusion in all regions. ECG positive for ischemia. Appears to be a "false-positive" ECG stress test.  . TUBAL LIGATION       Family History  Adopted: Yes     Social History   Substance and Sexual Activity  Drug Use No  ,  Social History   Substance and Sexual Activity  Alcohol Use No  ,  Social History   Tobacco Use  Smoking Status Former Smoker  . Last attempt to quit: 01/27/1992  . Years since quitting: 26.1  Smokeless Tobacco Never Used  ,    Current Outpatient Medications on File Prior to Visit  Medication Sig Dispense Refill  . acetaminophen (TYLENOL) 500 MG tablet Take 500 mg by mouth daily as needed (pain).    [name] aspirin EC 81 MG tablet Take 81 mg by mouth daily.    . Calcium Citrate-Vitamin D (CALCIUM CITRATE + D PO) Take 1 tablet by mouth daily.    . Cholecalciferol (VITAMIN D3) 1000 units CAPS Take 5 capsules by mouth daily. Told pt to increase to 5,000 IU per day    . Cranberry 1000 MG CAPS Take 1 capsule by mouth daily.    . fluticasone (FLONASE) 50 MCG/ACT nasal spray Place 1 spray into both nostrils 2 (two) times daily. 16 g 6  . ibuprofen (ADVIL,MOTRIN) 600 MG tablet Take 1 tablet (600 mg total) by mouth every 8 (eight) hours as needed. 30 tablet 0  . loratadine (CLARITIN) 10 MG tablet Take 1 tablet (10 mg total) by mouth daily. 30 tablet 11  . metoprolol succinate (TOPROL-XL) 25 MG 24 hr tablet Take 1.5 tablets (37.5 mg total) by mouth daily. 135 tablet 0  . Multiple Vitamin (MULTIVITAMIN WITH MINERALS) TABS tablet Take 1 tablet by mouth daily. Centrum Silver    . triamcinolone cream (KENALOG) 0.1 % Apply 1 application topically daily as needed (itching/eczema).   3   No current facility-administered  medications on file prior to visit.      Allergies  Allergen Reactions  . Clindamycin/Lincomycin Nausea Only  . Penicillins Anaphylaxis and Swelling  . Tussin Dm [Guaifenesin-Dm] Other (See Comments)    Dizziness/ Lightheadness  . Zithromax [Azithromycin] Other (See Comments)    Pre-syncope     Review of Systems:   General:  Denies fever, chills Optho/Auditory:   Denies visual changes, blurred vision Respiratory:   Denies SOB, cough, wheeze, DIB  Cardiovascular:   Denies chest pain, palpitations, painful respirations Gastrointestinal:   Denies nausea, vomiting, diarrhea.  Endocrine:     Denies new hot or cold intolerance Musculoskeletal:  Denies joint swelling, gait issues, or new unexplained myalgias/ arthralgias Skin:  Denies rash, suspicious lesions  Neurological:    Denies dizziness, unexplained weakness, numbness  Psychiatric/Behavioral:   Denies mood changes  Objective:    Blood pressure 140/74, pulse 60, temperature 98.8 F (37.1 C), height 5' 1" (1.549 m), weight 147 lb 14.4 oz (67.1 kg), SpO2 98 %.  Body mass index is 27.95 kg/m.  General: Well Developed, well nourished, and in no acute distress.  HEENT: Normocephalic, atraumatic, pupils equal round reactive to light, neck supple, No carotid bruits, no JVD Skin: Warm and dry, cap RF less 2 sec Cardiac: Regular rate and rhythm, S1, S2 WNL's, no murmurs rubs or gallops Respiratory: ECTA B/L, Not using accessory muscles, speaking in full sentences. NeuroM-Sk: Ambulates w/o assistance, moves ext * 4 w/o difficulty, sensation grossly intact.  Ext: scant edema  b/l lower ext Psych: No HI/SI, judgement and insight good, Euthymic mood. Full Affect.

## 2018-04-15 ENCOUNTER — Telehealth: Payer: Self-pay | Admitting: Family Medicine

## 2018-04-15 NOTE — Telephone Encounter (Signed)
Patient called states she is unable to locate provider's OTC in a 5,000 IU tabs & wonders if its okay to take  (5) 1000 IU tablets instead.  Cholecalciferol (VITAMIN D3) 1000 units CAPS [[id number]]   Order Details  Dose: 5 capsule Route: Oral Frequency: Daily  Dispense Quantity: -- Refills: -- Fills remaining: --        Sig: Take 5 capsules by mouth daily. Told pt to increase to 5,000 IU per day     --Pt request nurse/ medical assistant call her next week w/ advice.  --glh

## 2018-04-18 ENCOUNTER — Telehealth: Payer: Self-pay | Admitting: Family Medicine

## 2018-04-18 NOTE — Telephone Encounter (Signed)
Called patient left message to call the office. MPulliam, CMA/RT(R)  

## 2018-04-18 NOTE — Telephone Encounter (Signed)
Patient called back after getting message from [facility] about taking x5 1000s of Vit D. Patient states to call her back when available and leave recommendation on VM if you do not reach her live. Please use home number.

## 2018-04-19 NOTE — Telephone Encounter (Signed)
Called and left patient a message to ask pharmacy to help located the 5,000 units or to get the 2,000 units and take twice a day.  Advised patient to call me back if she already has the 1,000 units capsules on hand.  MPulliam, CMA/RT(R)

## 2018-04-19 NOTE — Telephone Encounter (Signed)
Please see additional note. MPulliam, CMA/RT(R)

## 2018-05-11 ENCOUNTER — Telehealth: Payer: Self-pay | Admitting: Family Medicine

## 2018-05-11 ENCOUNTER — Other Ambulatory Visit: Payer: Self-pay

## 2018-05-11 DIAGNOSIS — Z1211 Encounter for screening for malignant neoplasm of colon: Secondary | ICD-10-CM

## 2018-05-11 NOTE — Telephone Encounter (Signed)
Order has been placed and kit will be sent to the patient. MPulliam, CMA/RT(R)

## 2018-05-11 NOTE — Telephone Encounter (Signed)
Patient is requesting a cologuard order while she is home bound instead of doing a traditional colonoscopy, please advise and place order if applicable.

## 2018-06-08 DIAGNOSIS — Z1211 Encounter for screening for malignant neoplasm of colon: Secondary | ICD-10-CM | POA: Diagnosis not present

## 2018-06-08 LAB — COLOGUARD: Cologuard: NEGATIVE

## 2018-06-28 ENCOUNTER — Encounter: Payer: Self-pay | Admitting: Family Medicine

## 2018-06-28 ENCOUNTER — Ambulatory Visit (INDEPENDENT_AMBULATORY_CARE_PROVIDER_SITE_OTHER): Payer: Medicare Other | Admitting: Family Medicine

## 2018-06-28 ENCOUNTER — Other Ambulatory Visit: Payer: Self-pay

## 2018-06-28 VITALS — BP 146/83 | HR 78 | Temp 98.4°F | Ht 61.0 in | Wt 150.0 lb

## 2018-06-28 DIAGNOSIS — M545 Low back pain, unspecified: Secondary | ICD-10-CM

## 2018-06-28 DIAGNOSIS — I10 Essential (primary) hypertension: Secondary | ICD-10-CM

## 2018-06-28 DIAGNOSIS — Z87442 Personal history of urinary calculi: Secondary | ICD-10-CM

## 2018-06-28 DIAGNOSIS — R350 Frequency of micturition: Secondary | ICD-10-CM

## 2018-06-28 LAB — POCT URINALYSIS DIPSTICK
Bilirubin, UA: NEGATIVE
Blood, UA: NEGATIVE
Glucose, UA: NEGATIVE
Ketones, UA: NEGATIVE
Leukocytes, UA: NEGATIVE
Nitrite, UA: NEGATIVE
Protein, UA: NEGATIVE
Spec Grav, UA: 1.015 (ref 1.010–1.025)
Urobilinogen, UA: 0.2 E.U./dL
pH, UA: 7 (ref 5.0–8.0)

## 2018-06-28 NOTE — Patient Instructions (Addendum)
Brittany Harvey I do not see the results of your Cologuard test.  Please call them for further information      Your goal blood pressure should be 140/90 or less on a regular basis, or medications should be started/ modified.    Normal blood pressure is less than 120/80.  Please check it at home and write down the results of blood pressure as well as pulse.  Bring it in next office visit.    Hypertension Hypertension, commonly called high blood pressure, is when the force of blood pumping through the arteries is too strong. The arteries are the blood vessels that carry blood from the heart throughout the body. Hypertension forces the heart to work harder to pump blood and may cause arteries to become narrow or stiff. Having untreated or uncontrolled hypertension can cause heart attacks, strokes, kidney disease, and other problems. A blood pressure reading consists of a higher number over a lower number. Ideally, your blood pressure should be below 120/80. The first ("top") number is called the systolic pressure. It is a measure of the pressure in your arteries as your heart beats. The second ("bottom") number is called the diastolic pressure. It is a measure of the pressure in your arteries as the heart relaxes. What are the causes? The cause of this condition is not known. What increases the risk? Some risk factors for high blood pressure are under your control. Others are not. Factors you can change  Smoking.  Having type 2 diabetes mellitus, high cholesterol, or both.  Not getting enough exercise or physical activity.  Being overweight.  Having too much fat, sugar, calories, or salt (sodium) in your diet.  Drinking too much alcohol. Factors that are difficult or impossible to change  Having chronic kidney disease.  Having a family history of high blood pressure.  Age. Risk increases with age.  Race. You may be at higher risk if you are African-American.  Gender. Men are at higher  risk than women before age 89. After age 6, women are at higher risk than men.  Having obstructive sleep apnea.  Stress. What are the signs or symptoms? Extremely high blood pressure (hypertensive crisis) may cause:  Headache.  Anxiety.  Shortness of breath.  Nosebleed.  Nausea and vomiting.  Severe chest pain.  Jerky movements you cannot control (seizures).  How is this diagnosed? This condition is diagnosed by measuring your blood pressure while you are seated, with your arm resting on a surface. The cuff of the blood pressure monitor will be placed directly against the skin of your upper arm at the level of your heart. It should be measured at least twice using the same arm. Certain conditions can cause a difference in blood pressure between your right and left arms. Certain factors can cause blood pressure readings to be lower or higher than normal (elevated) for a short period of time:  When your blood pressure is higher when you are in a health care provider's office than when you are at home, this is called white coat hypertension. Most people with this condition do not need medicines.  When your blood pressure is higher at home than when you are in a health care provider's office, this is called masked hypertension. Most people with this condition may need medicines to control blood pressure.  If you have a high blood pressure reading during one visit or you have normal blood pressure with other risk factors:  You may be asked to return on a  different day to have your blood pressure checked again.  You may be asked to monitor your blood pressure at home for 1 week or longer.  If you are diagnosed with hypertension, you may have other blood or imaging tests to help your health care provider understand your overall risk for other conditions. How is this treated? This condition is treated by making healthy lifestyle changes, such as eating healthy foods, exercising more,  and reducing your alcohol intake. Your health care provider may prescribe medicine if lifestyle changes are not enough to get your blood pressure under control, and if:  Your systolic blood pressure is above 130.  Your diastolic blood pressure is above 80.  Your personal target blood pressure may vary depending on your medical conditions, your age, and other factors. Follow these instructions at home: Eating and drinking  Eat a diet that is high in fiber and potassium, and low in sodium, added sugar, and fat. An example eating plan is called the DASH (Dietary Approaches to Stop Hypertension) diet. To eat this way: ? Eat plenty of fresh fruits and vegetables. Try to fill half of your plate at each meal with fruits and vegetables. ? Eat whole grains, such as whole wheat pasta, brown rice, or whole grain bread. Fill about one quarter of your plate with whole grains. ? Eat or drink low-fat dairy products, such as skim milk or low-fat yogurt. ? Avoid fatty cuts of meat, processed or cured meats, and poultry with skin. Fill about one quarter of your plate with lean proteins, such as fish, chicken without skin, beans, eggs, and tofu. ? Avoid premade and processed foods. These tend to be higher in sodium, added sugar, and fat.  Reduce your daily sodium intake. Most people with hypertension should eat less than 1,500 mg of sodium a day.  Limit alcohol intake to no more than 1 drink a day for nonpregnant women and 2 drinks a day for men. One drink equals 12 oz of beer, 5 oz of wine, or 1 oz of hard liquor. Lifestyle  Work with your health care provider to maintain a healthy body weight or to lose weight. Ask what an ideal weight is for you.  Get at least 30 minutes of exercise that causes your heart to beat faster (aerobic exercise) most days of the week. Activities may include walking, swimming, or biking.  Include exercise to strengthen your muscles (resistance exercise), such as pilates or  lifting weights, as part of your weekly exercise routine. Try to do these types of exercises for 30 minutes at least 3 days a week.  Do not use any products that contain nicotine or tobacco, such as cigarettes and e-cigarettes. If you need help quitting, ask your health care provider.  Monitor your blood pressure at home as told by your health care provider.  Keep all follow-up visits as told by your health care provider. This is important. Medicines  Take over-the-counter and prescription medicines only as told by your health care provider. Follow directions carefully. Blood pressure medicines must be taken as prescribed.  Do not skip doses of blood pressure medicine. Doing this puts you at risk for problems and can make the medicine less effective.  Ask your health care provider about side effects or reactions to medicines that you should watch for. Contact a health care provider if:  You think you are having a reaction to a medicine you are taking.  You have headaches that keep coming back (recurring).  You  feel dizzy.  You have swelling in your ankles.  You have trouble with your vision. Get help right away if:  You develop a severe headache or confusion.  You have unusual weakness or numbness.  You feel faint.  You have severe pain in your chest or abdomen.  You vomit repeatedly.  You have trouble breathing. Summary  Hypertension is when the force of blood pumping through your arteries is too strong. If this condition is not controlled, it may put you at risk for serious complications.  Your personal target blood pressure may vary depending on your medical conditions, your age, and other factors. For most people, a normal blood pressure is less than 120/80.  Hypertension is treated with lifestyle changes, medicines, or a combination of both. Lifestyle changes include weight loss, eating a healthy, low-sodium diet, exercising more, and limiting alcohol. This  information is not intended to replace advice given to you by your health care provider. Make sure you discuss any questions you have with your health care provider. Document Released: 01/12/2005 Document Revised: 12/11/2015 Document Reviewed: 12/11/2015 Elsevier Interactive Patient Education  2018 [employer].    How to Take Your Blood Pressure   Blood pressure is a measurement of how strongly your blood is pressing against the walls of your arteries. Arteries are blood vessels that carry blood from your heart throughout your body. Your health care provider takes your blood pressure at each office visit. You can also take your own blood pressure at home with a blood pressure machine. You may need to take your own blood pressure:  To confirm a diagnosis of high blood pressure (hypertension).  To monitor your blood pressure over time.  To make sure your blood pressure medicine is working.  Supplies needed: To take your blood pressure, you will need a blood pressure machine. You can buy a blood pressure machine, or blood pressure monitor, at most drugstores or online. There are several types of home blood pressure monitors. When choosing one, consider the following:  Choose a monitor that has an arm cuff.  Choose a monitor that wraps snugly around your upper arm. You should be able to fit only one finger between your arm and the cuff.  Do not choose a monitor that measures your blood pressure from your wrist or finger.  Your health care provider can suggest a reliable monitor that will meet your needs. How to prepare To get the most accurate reading, avoid the following for 30 minutes before you check your blood pressure:  Drinking caffeine.  Drinking alcohol.  Eating.  Smoking.  Exercising.  Five minutes before you check your blood pressure:  Empty your bladder.  Sit quietly without talking in a dining chair, rather than in a soft couch or armchair.  How to take your  blood pressure To check your blood pressure, follow the instructions in the manual that came with your blood pressure monitor. If you have a digital blood pressure monitor, the instructions may be as follows: 1. Sit up straight. 2. Place your feet on the floor. Do not cross your ankles or legs. 3. Rest your left arm at the level of your heart on a table or desk or on the arm of a chair. 4. Pull up your shirt sleeve. 5. Wrap the blood pressure cuff around the upper part of your left arm, 1 inch (2.5 cm) above your elbow. It is best to wrap the cuff around bare skin. 6. Fit the cuff snugly around your  arm. You should be able to place only one finger between the cuff and your arm. 7. Position the cord inside the groove of your elbow. 8. Press the power button. 9. Sit quietly while the cuff inflates and deflates. 10. Read the digital reading on the monitor screen and write it down (record it). 11. Wait 2-3 minutes, then repeat the steps, starting at step 1.  What does my blood pressure reading mean? A blood pressure reading consists of a higher number over a lower number. Ideally, your blood pressure should be below 120/80. The first ("top") number is called the systolic pressure. It is a measure of the pressure in your arteries as your heart beats. The second ("bottom") number is called the diastolic pressure. It is a measure of the pressure in your arteries as the heart relaxes. Blood pressure is classified into four stages. The following are the stages for adults who do not have a short-term serious illness or a chronic condition. Systolic pressure and diastolic pressure are measured in a unit called mm Hg. Normal  Systolic pressure: below 081.  Diastolic pressure: below 80. Elevated  Systolic pressure: 448-185.  Diastolic pressure: below 80. Hypertension stage 1  Systolic pressure: 631-497.  Diastolic pressure: 02-63. Hypertension stage 2  Systolic pressure: 785 or  above.  Diastolic pressure: 90 or above. You can have prehypertension or hypertension even if only the systolic or only the diastolic number in your reading is higher than normal. Follow these instructions at home:  Check your blood pressure as often as recommended by your health care provider.  Take your monitor to the next appointment with your health care provider to make sure: ? That you are using it correctly. ? That it provides accurate readings.  Be sure you understand what your goal blood pressure numbers are.  Tell your health care provider if you are having any side effects from blood pressure medicine. Contact a health care provider if:  Your blood pressure is consistently high. Get help right away if:  Your systolic blood pressure is higher than 180.  Your diastolic blood pressure is higher than 110. This information is not intended to replace advice given to you by your health care provider. Make sure you discuss any questions you have with your health care provider. Document Released: 06/21/2015 Document Revised: 09/03/2015 Document Reviewed: 06/21/2015 Elsevier Interactive Patient Education  [employer].

## 2018-06-28 NOTE — Progress Notes (Signed)
Impression and Recommendations:    1. Frequency of urination   2. History of nephrolithiasis   3. Low back pain, unspecified back pain laterality, unspecified chronicity, unspecified whether sciatica present   4. White coat syndrome with hypertension     Brittany Harvey, my CMA said we do not have the results of your Cologuard test.  Please call them for further information  HTN w white coat:   -- not at goal today- but has white coat syn - Your goal blood pressure should be 140/90 or less on a regular basis, or medications should be started/ modified.    - Please check it at home and write down the results of blood pressure as well as pulse.  Bring it in next office visit.   1. Frequency of urination  &  personal history of nephrolithiasis -Explained UA is completely clear.  Explained to patient that since there was no microscopic blood seen, this was very unlikely to be a stone however it could have been. -Discussed proper hydration with patient and supportive care - POCT urinalysis dipstick done today in office  -within normal limits  2. 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 -We discussed red flag symptoms and if she develops any she will let us know. -Over-the-counter medications for pain relief and lifestyle modifications discussed with patient - POCT urinalysis dipstick    Education and routine counseling performed. Handouts provided.  Orders Placed This Encounter  Procedures   POCT urinalysis dipstick    The patient was counseled, risk factors were discussed, anticipatory guidance given.  Gross side effects, risk and benefits, and alternatives of medications discussed with patient.  Patient is aware that all medications have potential side effects and we are unable  to predict every side effect or drug-drug interaction that may occur.  Expresses verbal understanding and consents to current therapy plan and treatment regimen.   Return if symptoms worsen or fail to improve for this acute problem Also:, for Keep your chronic follow-ups as previously discussed.Brittany Harvey Brittany Harvey  06/28/2018 4:30 PM    Subjective:    HPI: Brittany Harvey is a 73 y.o. female who presents to [place] at [facility] today for c/o increased urinary frequency.  Sx for 10-14 days.     C/O: Patient has been peeing more frequently than usual for the past 10 to 14 days.  She does not have any pain when she urinates, she has not see any blood, or changes in the color of her urine.   --> However 1 time-yesterday-she thought that she saw a stone after urinating in the commode.  She has a history of nephrolithiasis.   --->she came in today because she is worried that the stone may have caused blood to be in her urine and may have caused a urinary tract infection.   Ever since yesterday, her symptoms of increased urinary frequency have seemed to have abated.  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  Denies: Fever chills, blood in the urine, burning on urination, nausea , vomiting or diarrhea denies suprapubic tenderness   --- She is curious to see what her colon cancer screening test  results were  Urinalysis    Component Value Date/Time   COLORURINE STRAW (A) 06/21/2010 2351   APPEARANCEUR CLEAR 06/21/2010 2351   LABSPEC 1.006 06/21/2010 2351   PHURINE 7.5 06/21/2010 2351   GLUCOSEU NEGATIVE 06/21/2010 2351   HGBUR TRACE (A) 06/21/2010 2351   BILIRUBINUR negative 06/28/2018 1433   KETONESUR NEGATIVE 06/21/2010 2351   PROTEINUR Negative 06/28/2018 1433   PROTEINUR NEGATIVE 06/21/2010 2351   UROBILINOGEN 0.2 06/28/2018 1433    UROBILINOGEN 0.2 06/21/2010 2351   NITRITE negative 06/28/2018 1433   NITRITE NEGATIVE 06/21/2010 2351   LEUKOCYTESUR Negative 06/28/2018 1433    Wt Readings from Last 3 Encounters:  06/28/18 150 lb (68 kg)  03/16/18 147 lb 14.4 oz (67.1 kg)  02/09/18 144 lb (65.3 kg)   BP Readings from Last 3 Encounters:  06/28/18 (!) 146/83  03/16/18 140/74  02/09/18 126/74   Pulse Readings from Last 3 Encounters:  06/28/18 78  03/16/18 60  02/09/18 71   BMI Readings from Last 3 Encounters:  06/28/18 28.34 kg/m  03/16/18 27.95 kg/m  02/09/18 27.21 kg/m     Patient Active Problem List   Diagnosis Date Noted   White coat syndrome with hypertension 03/15/2017    Priority: High   Prediabetes 09/24/2015    Priority: High   Hyperlipidemia 08/20/2015    Priority: High   Essential hypertension 07/24/2014    Priority: High   PSVT (paroxysmal supraventricular tachycardia) ([place]) 03/27/2013    Priority: High   Elevated HDL = 90 09/24/2015    Priority: Medium   Fatigue 09/24/2015    Priority: Medium   GERD (gastroesophageal reflux disease) 11/27/2013    Priority: Medium   Vitamin D deficiency 11/10/2016    Priority: Low   Environmental and seasonal allergies 06/10/2016    Priority: Low   Upper back strain 05/07/2016    Priority: Low   Overweight (BMI 25.0-29.9) 09/24/2015    Priority: Low   Osteopenia by Dexa (8-9 yrs ago)  08/20/2015    Priority: Low   History of nephrolithiasis 07/22/2018   Statin declined 03/16/2018   Stressful job 08/12/2016   Tiredness 08/12/2016   Nonintractable headache 08/12/2016   Other acute fatigue 08/12/2016   Benign positional vertigo, bilateral 06/10/2016   Cough 03/06/2016   Chest pain 04/25/2013    Past Surgical History:  Procedure Laterality Date   CARDIOVASCULAR STRESS TEST  06/19/2009   Post stress myocardial perfusion images show a normal pattern of perfusion in all regions. ECG positive for ischemia. Appears to be  a "false-positive" ECG stress test.   TUBAL LIGATION      Family History  Adopted: Yes    Social History   Substance and Sexual Activity  Drug Use No  ,  Social History   Substance and Sexual Activity  Alcohol Use No  ,  Social History   Tobacco Use  Smoking Status Former Smoker   Quit date: 01/27/1992   Years since quitting: 26.5  Smokeless Tobacco Never Used  ,  Social History   Substance and Sexual Activity  Sexual Activity Never   Birth control/protection: None    Patient's Medications  New Prescriptions   No medications on file  Previous Medications   ACETAMINOPHEN (TYLENOL) 500 MG TABLET    Take 500 mg by mouth daily as needed (pain).   ASPIRIN EC 81 MG TABLET    Take 81 mg by mouth daily.   CALCIUM CITRATE-VITAMIN D (CALCIUM CITRATE + D PO)    Take  1 tablet by mouth daily.   CHOLECALCIFEROL (VITAMIN D3) 1000 UNITS CAPS    Take 5 capsules by mouth daily. Told pt to increase to 5,000 IU per day   CRANBERRY 1000 MG CAPS    Take 1 capsule by mouth daily.   FLUTICASONE (FLONASE) 50 MCG/ACT NASAL SPRAY    Place 1 spray into both nostrils 2 (two) times daily.   IBUPROFEN (ADVIL,MOTRIN) 600 MG TABLET    Take 1 tablet (600 mg total) by mouth every 8 (eight) hours as needed.   LORATADINE (CLARITIN) 10 MG TABLET    Take 1 tablet (10 mg total) by mouth daily.   METOPROLOL SUCCINATE (TOPROL-XL) 25 MG 24 HR TABLET    Take 1.5 tablets (37.5 mg total) by mouth daily.   MULTIPLE VITAMIN (MULTIVITAMIN WITH MINERALS) TABS TABLET    Take 1 tablet by mouth daily. Centrum Silver   TRIAMCINOLONE CREAM (KENALOG) 0.1 %    Apply 1 application topically daily as needed (itching/eczema).   Modified Medications   No medications on file  Discontinued Medications   No medications on file    Clindamycin/lincomycin, Penicillins, Tussin dm [guaifenesin-dm], and Zithromax [azithromycin]  Current Meds  Medication Sig   acetaminophen (TYLENOL) 500 MG tablet Take 500 mg by mouth daily  as needed (pain).   aspirin EC 81 MG tablet Take 81 mg by mouth daily.   Calcium Citrate-Vitamin D (CALCIUM CITRATE + D PO) Take 1 tablet by mouth daily.   Cholecalciferol (VITAMIN D3) 1000 units CAPS Take 5 capsules by mouth daily. Told pt to increase to 5,000 IU per day   Cranberry 1000 MG CAPS Take 1 capsule by mouth daily.   fluticasone (FLONASE) 50 MCG/ACT nasal spray Place 1 spray into both nostrils 2 (two) times daily.   ibuprofen (ADVIL,MOTRIN) 600 MG tablet Take 1 tablet (600 mg total) by mouth every 8 (eight) hours as needed.   loratadine (CLARITIN) 10 MG tablet Take 1 tablet (10 mg total) by mouth daily.   metoprolol succinate (TOPROL-XL) 25 MG 24 hr tablet Take 1.5 tablets (37.5 mg total) by mouth daily.   Multiple Vitamin (MULTIVITAMIN WITH MINERALS) TABS tablet Take 1 tablet by mouth daily. Centrum Silver   triamcinolone cream (KENALOG) 0.1 % Apply 1 application topically daily as needed (itching/eczema).     Review of Systems: General:   No F/C, wt loss Pulm:   No DIB, pleuritic chest pain Card:  No CP, palpitations Abd:  No n/v/d or pain GU:  Dysuria, increased frequency and urgency; no vaginal discharge Ext:  No inc edema from baseline   Objective:  Blood pressure (!) 146/83, pulse 78, temperature 98.4 F (36.9 C), height 5\' 1"  (1.549 m), weight 150 lb (68 kg). Body mass index is 28.34 kg/m.  General: Well Developed, well nourished, and in no acute distress.  HEENT: Normocephalic, atraumatic Skin: Warm and dry, cap RF less 2 sec, good turgor CV: +S1, S2, normal chest excursion Respiratory: ECTA B/L; speaking in full sentences, no conversational dyspnea Abd: Soft, NT, ND, No G/R/R, no SPT, No flank pain-normal exam NeuroM-Sk: Ambulates w/o assistance, moves * 4, no gross bony or muscular abnormality in lower back or flank region Psych: A and O *3

## 2018-06-30 ENCOUNTER — Telehealth: Payer: Self-pay | Admitting: Family Medicine

## 2018-06-30 NOTE — Telephone Encounter (Signed)
Patient called and left VM stating that after last OV this week, she was supposed to receive a call about her latest Cologuard results. Can we please contact patient with findings.

## 2018-07-04 NOTE — Telephone Encounter (Signed)
Requested records. MPulliam, CMA/RT(R)

## 2018-07-11 ENCOUNTER — Telehealth: Payer: Self-pay | Admitting: Family Medicine

## 2018-07-11 NOTE — Telephone Encounter (Signed)
Patient called to see if results of Cologuard have come in-- forwarding message to medical asst for review w/patient.  --glh

## 2018-07-13 ENCOUNTER — Telehealth: Payer: Self-pay

## 2018-07-13 NOTE — Telephone Encounter (Signed)
Patient notified that cologuard results are negative. MPulliam, CMA/RT(R)

## 2018-07-22 DIAGNOSIS — Z87442 Personal history of urinary calculi: Secondary | ICD-10-CM | POA: Insufficient documentation

## 2018-08-23 ENCOUNTER — Other Ambulatory Visit: Payer: Self-pay | Admitting: Cardiovascular Disease

## 2018-09-14 ENCOUNTER — Ambulatory Visit: Payer: Medicare Other | Admitting: Family Medicine

## 2018-09-22 ENCOUNTER — Other Ambulatory Visit: Payer: Self-pay

## 2018-09-22 ENCOUNTER — Ambulatory Visit (INDEPENDENT_AMBULATORY_CARE_PROVIDER_SITE_OTHER): Payer: Medicare Other | Admitting: Family Medicine

## 2018-09-22 ENCOUNTER — Encounter: Payer: Self-pay | Admitting: Family Medicine

## 2018-09-22 VITALS — BP 127/68 | HR 78 | Temp 98.5°F | Wt 144.4 lb

## 2018-09-22 DIAGNOSIS — Z5320 Procedure and treatment not carried out because of patient's decision for unspecified reasons: Secondary | ICD-10-CM

## 2018-09-22 DIAGNOSIS — Z566 Other physical and mental strain related to work: Secondary | ICD-10-CM

## 2018-09-22 DIAGNOSIS — E7800 Pure hypercholesterolemia, unspecified: Secondary | ICD-10-CM

## 2018-09-22 DIAGNOSIS — Z823 Family history of stroke: Secondary | ICD-10-CM | POA: Diagnosis not present

## 2018-09-22 DIAGNOSIS — I471 Supraventricular tachycardia, unspecified: Secondary | ICD-10-CM

## 2018-09-22 DIAGNOSIS — I10 Essential (primary) hypertension: Secondary | ICD-10-CM

## 2018-09-22 DIAGNOSIS — R7303 Prediabetes: Secondary | ICD-10-CM

## 2018-09-22 NOTE — Progress Notes (Signed)
Telehealth office visit note for Brittany Harvey, D.O- at Primary Care at [facility]   I connected with current patient today and verified that I am speaking with the correct person using two identifiers.   . Location of the patient: Home . Location of the provider: Office Only the patient (+/- their family members at pt's discretion) and myself were participating in the encounter    - This visit type was conducted due to national recommendations for restrictions regarding the COVID-19 Pandemic (e.g. social distancing) in an effort to limit this patient's exposure and mitigate transmission in our community.  This format is felt to be most appropriate for this patient at this time.   - The patient did not have access to video technology or had technical difficulties with video requiring transitioning to audio format only. - No physical exam could be performed with this format, beyond that communicated to [place] by the patient/ family members as noted.   - Additionally my office staff/ schedulers discussed with the patient that there may be a monetary charge related to this service, depending on their medical insurance.   The patient expressed understanding, and agreed to proceed.       History of Present Illness:  States she's doing great.  Reiterates that she's been doing good.  "I can't complain.  I'm still working and staying healthy, and you can't ask for more than that."  Goes to see the eye doctor next month.  Also has an appointment for her mammogram in October.  States she has been taking vinegar tablets because "it's supposed to be healthy for you."  Weight: Her weight was 144.4 without clothing this morning.  Temp: States her temperature was 98.5.  At work, her temperature was 96.7.  Feels her work temp monitor is not working properly.  Vitamin D: Continues taking 5000 IU's Vitamin D.  Stress: Patient feels her stress levels are controlled at work.  She works at [employer]   Says a few months ago, her baby girl had a stroke, but she's "all better now, walking and talking and everything."  Notes "they got her to the hospital in time to give her some kind of shot, and she's back to walking and talking and everything now."  Her daughter is in her 89's.  Notes her daughter does not smoke and she "isn't sure what happened."  Says "it just so happened she was with a nurse that knew what was going on, and told her to go to the hospital."  Daughter has been through speech therapy and PT for two weeks and has made a full recovery.  Says "they have a tendency to not tell me things."  Her daughter has two girls and a son that graduated in April.  1. HTN HPI:  BP: States doing fine, controlled at home.  At home, she checked and notes it was 133/59 on her left arm, with a pulse of 74.  Says she hasn't had any heart palpitations or problems since the time she had "to go to the hospital and had a shot."  Says "I haven't had a problem since."  Managed on Toprol, one and a half daily.  Patient reports good compliance with blood pressure medications.  Denies medication S-E.  Has an appointment with Dr. Gwenlyn Found in October.  - She denies new onset of: chest pain, exercise intolerance, shortness of breath, dizziness, visual changes, headache, lower extremity swelling or claudication.   Last 3 blood  pressure readings in our office are as follows: BP Readings from Last 3 Encounters:  09/22/18 127/68  06/28/18 (!) 146/83  03/16/18 140/74    Filed Weights   09/22/18 1604  Weight: 144 lb 6.4 oz (65.5 kg)    Cholesterol: States she's trying to eat right, walking, trying to watch what she eats.  Since last chronic follow-up, doesn't think she's done anything drastically different; just "watching junk food and watching salt."  Says "everything in, what is it? Modesty? Moderation."  Says that Dr. Gwenlyn Found of cardiology hasn't said anything to her about her cholesterol.  Says she's been  trying to keep her weight down; was 150 lbs prior.  Has been watching what she eats and how much she eats.  Says "there are some days you feel like you could eat all the ice cream, and other days you're like 'I'm gonna eat none of that stuff.' "  The cholesterol last visit was:  Lab Results  Component Value Date   CHOL 216 (H) 11/18/2017   HDL 86 11/18/2017   LDLCALC 109 (H) 11/18/2017   TRIG 104 11/18/2017   CHOLHDL 2.5 11/18/2017    Hepatic Function Latest Ref Rng & Units 11/18/2017 08/14/2016 05/09/2015  Total Protein 6.0 - 8.5 g/dL 7.3 7.3 -  Albumin 3.5 - 4.8 g/dL 4.4 4.4 -  AST 0 - 40 IU/L _0 ALT 0 - 32 IU/L _1 Alk Phosphatase 39 - 117 IU/L 60 63 68  Total Bilirubin 0.0 - 1.2 mg/dL 0.6 0.7 -    GAD 7 : Generalized Anxiety Score 08/12/2016  Nervous, Anxious, on Edge 1  Control/stop worrying 1  Worry too much - different things 1  Trouble relaxing 0  Restless 0  Easily annoyed or irritable 0  Afraid - awful might happen 0  Total GAD 7 Score 3  Anxiety Difficulty Not difficult at all    Depression screen [facility] 2/9 09/22/2018 06/28/2018 03/16/2018 12/22/2017 11/10/2017  Decreased Interest 0 0 0 0 0  Down, Depressed, Hopeless 0 0 0 0 0  PHQ - 2 Score 0 0 0 0 0  Altered sleeping 0 0 0 1 0  Tired, decreased energy 0 0 0 1 0  Change in appetite 0 0 0 1 0  Feeling bad or failure about yourself  0 0 0 0 0  Trouble concentrating 0 0 0 0 0  Moving slowly or fidgety/restless 0 0 0 0 0  Suicidal thoughts 0 0 0 0 0  PHQ-9 Score 0 0 0 3 0  Difficult doing work/chores Not difficult at all Not difficult at all Not difficult at all Not difficult at all Not difficult at all  Some recent data might be hidden        Impression and Recommendations:    1. Essential hypertension   2. White coat syndrome with hypertension   3. PSVT (paroxysmal supraventricular tachycardia) ([place])   4. Prediabetes   5. Pure hypercholesterolemia   6. Statin declined   7. Stressful job   8.  Family history of stroke- daughter age 32      Stressful Job - Per patient, mood and stress at work manageable at this time. - Patient states she feels safe at work during [place].  - Novel Covid -19 counseling done; all questions were answered.   - Current CDC / federal and [place] guidelines reviewed with patient  - Reminded pt of extreme importance of social distancing; wearing a mask  when out in public; insensate handwashing and cleaning of surfaces, avoiding unnecessary trips for shopping and avoiding ALL but emergency appts etc. - Told patient to be prepared, not scared; and be smart for the sake of others - Patient will call with any additional concerns  Essential Hypertension, White Coat Syndrome with HTN - BP stable at this time. - Continue treatment plan as prescribed. - Patient tolerating meds well without S-E.  - Lifestyle changes such as dash diet and engaging in a regular exercise program discussed with patient.  Educational handouts provided  - Ambulatory BP monitoring encouraged. Keep log and bring in next OV. - Will continue to monitor.  PSVT - Stable at this time. - Patient following up with Dr. Gwenlyn Found in October. - Encouraged patient to continue to follow up with cardiology with concerns PRN. - Will continue to monitor.  Vitamin D - Measurements at 50.7 and 50.3 last checks. - Continue supplementation as prescribed.  See med list. - Advised that keeping this supplementation and value up is important to prevent bone loss.  - Will continue to monitor.  Prediabetes - A1c 5.5 last check.  - Counseled patient on prevention of disease and discussed dietary and lifestyle modifications as first line.  Importance of low carb diet discussed with patient in addition to regular exercise.   - Will continue to monitor.  Pure Hypercholesterolemia -  Statin Declined - Last labs obtained October 24 of 2019. - Discussed that patient met criteria at that time to begin  meds. - Reviewed need to keep a close eye on cholesterol and re-check in two months.  - Dietary changes such as low saturated & trans fat and low carb diets discussed with patient.  Encouraged regular exercise and weight loss when appropriate.   - Will continue to monitor.  Recommendations - Encouraged patient to go to the [facility] website and read about the Shingrix vaccine.  - Return for lab re-check end of October 2020. - Patient due for Medicare Wellness, last done 12/22/2017. - Reviewed that pt may come in around December first for both labs and Wellness visit.   - As part of my medical decision making, I reviewed the following data within the [place] History obtained from pt /family, CMA notes reviewed and incorporated if applicable, Labs reviewed, Radiograph/ tests reviewed if applicable and OV notes from prior OV's with me, as well as other specialists she/he has seen since seeing me last, were all reviewed and used in my medical decision making process today.   - Additionally, discussion had with patient regarding txmnt plan, and their biases/concerns about that plan were used in my medical decision making today.   - The patient agreed with the plan and demonstrated an understanding of the instructions.   No barriers to understanding were identified.   - Red flag symptoms and signs discussed in detail.  Patient expressed understanding regarding what to do in case of emergency\ urgent symptoms.  The patient was advised to call back or seek an in-person evaluation if the symptoms worsen or if the condition fails to improve as anticipated.   Return for 12/26/18- medicare wellness w FBW same day.     I provided 22+ minutes of non face-to-face time during this encounter.  Additional time was spent with charting and coordination of care after the actual visit commenced.   Note:  This note was prepared with assistance of Dragon voice recognition software. Occasional  wrong-word or sound-a-like substitutions may have occurred  due to the inherent limitations of voice recognition software.   This document serves as a record of services personally performed by Brittany Dance, DO. It was created on her behalf by Toni Amend, a trained medical scribe. The creation of this record is based on the scribe's personal observations and the provider's statements to them.   I have reviewed the above medical documentation for accuracy and completeness and I concur.  Brittany Dance, DO 09/22/2018 5:27 PM        Patient Care Team    Relationship Specialty Notifications Start End  Brittany Dance, DO PCP - General Family Medicine  08/19/15   Lorretta Harp, MD PCP - Cardiology Cardiology  11/03/17   Juanita Craver, MD Consulting Physician Gastroenterology  09/24/15   Lorretta Harp, MD Consulting Physician Cardiology  09/24/15   Everlene Farrier, MD Consulting Physician Obstetrics and Gynecology  09/24/15    Comment: Juanda Chance, NP     -Vitals obtained; medications/ allergies reconciled;  personal medical, social, Sx etc.histories were updated by CMA, reviewed by me and are reflected in chart   Patient Active Problem List   Diagnosis Date Noted  . White coat syndrome with hypertension 03/15/2017    Priority: High  . Prediabetes 09/24/2015    Priority: High  . Hyperlipidemia 08/20/2015    Priority: High  . Essential hypertension 07/24/2014    Priority: High  . PSVT (paroxysmal supraventricular tachycardia) ([place]) 03/27/2013    Priority: High  . Elevated HDL = 90 09/24/2015    Priority: Medium  . Fatigue 09/24/2015    Priority: Medium  . GERD (gastroesophageal reflux disease) 11/27/2013    Priority: Medium  . Vitamin D deficiency 11/10/2016    Priority: Low  . Environmental and seasonal allergies 06/10/2016    Priority: Low  . Upper back strain 05/07/2016    Priority: Low  . Overweight (BMI 25.0-29.9) 09/24/2015    Priority: Low  .  Osteopenia by Dexa (8-9 yrs ago)  08/20/2015    Priority: Low  . Family history of stroke- daughter age 35 09/22/2018  . History of nephrolithiasis 07/22/2018  . Statin declined 03/16/2018  . Stressful job 08/12/2016  . Tiredness 08/12/2016  . Nonintractable headache 08/12/2016  . Other acute fatigue 08/12/2016  . Benign positional vertigo, bilateral 06/10/2016  . Cough 03/06/2016  . Chest pain 04/25/2013     Current Meds  Medication Sig  . acetaminophen (TYLENOL) 500 MG tablet Take 500 mg by mouth daily as needed (pain).  [name] aspirin EC 81 MG tablet Take 81 mg by mouth daily.  . Calcium Citrate-Vitamin D (CALCIUM CITRATE + D PO) Take 1 tablet by mouth daily.  . Cholecalciferol (VITAMIN D3) 1000 units CAPS Take 5 capsules by mouth daily. Told pt to increase to 5,000 IU per day  . Cranberry 1000 MG CAPS Take 1 capsule by mouth daily.  [name] ibuprofen (ADVIL,MOTRIN) 600 MG tablet Take 1 tablet (600 mg total) by mouth every 8 (eight) hours as needed.  . loratadine (CLARITIN) 10 MG tablet Take 1 tablet (10 mg total) by mouth daily.  . metoprolol succinate (TOPROL-XL) 25 MG 24 hr tablet TAKE 1 AND 1/2 TABLETS EVERY DAY  . Multiple Vitamin (MULTIVITAMIN WITH MINERALS) TABS tablet Take 1 tablet by mouth daily. Centrum Silver  . triamcinolone cream (KENALOG) 0.1 % Apply 1 application topically daily as needed (itching/eczema).      Allergies:  Allergies  Allergen Reactions  . Clindamycin/Lincomycin Nausea Only  . Penicillins Anaphylaxis and Swelling  .  Tussin Dm [Guaifenesin-Dm] Other (See Comments)    Dizziness/ Lightheadness  . Zithromax [Azithromycin] Other (See Comments)    Pre-syncope     ROS:  See above HPI for pertinent positives and negatives   Objective:   Blood pressure 127/68, pulse 78, temperature 98.5 F (36.9 C), temperature source Oral, weight 144 lb 6.4 oz (65.5 kg).  (if some vitals are omitted, this means that patient was UNABLE to obtain them even though they  were asked to get them prior to OV today.  They were asked to call us at their earliest convenience with these once obtained. )  General: A & O * 3; sounds in no acute distress; in usual state of health.  Skin: Pt confirms warm and dry extremities and pink fingertips HEENT: Pt confirms lips non-cyanotic Chest: Patient confirms normal chest excursion and movement Respiratory: speaking in full sentences, no conversational dyspnea; patient confirms no use of accessory muscles Psych: insight appears good, mood- appears full

## 2018-11-04 DIAGNOSIS — Z23 Encounter for immunization: Secondary | ICD-10-CM | POA: Diagnosis not present

## 2018-11-08 ENCOUNTER — Encounter: Payer: Self-pay | Admitting: Cardiovascular Disease

## 2018-11-08 ENCOUNTER — Other Ambulatory Visit: Payer: Self-pay

## 2018-11-08 ENCOUNTER — Ambulatory Visit (INDEPENDENT_AMBULATORY_CARE_PROVIDER_SITE_OTHER): Payer: Medicare Other | Admitting: Cardiovascular Disease

## 2018-11-08 DIAGNOSIS — I10 Essential (primary) hypertension: Secondary | ICD-10-CM | POA: Diagnosis not present

## 2018-11-08 DIAGNOSIS — R079 Chest pain, unspecified: Secondary | ICD-10-CM

## 2018-11-08 DIAGNOSIS — I471 Supraventricular tachycardia: Secondary | ICD-10-CM

## 2018-11-08 NOTE — Patient Instructions (Signed)
Medication Instructions:  Your physician recommends that you continue on your current medications as directed. Please refer to the Current Medication list given to you today.  If you need a refill on your cardiac medications before your next appointment, please call your pharmacy.   Lab work: none If you have labs (blood work) drawn today and your tests are completely normal, you will receive your results only by: [name] MyChart Message (if you have MyChart) OR . A paper copy in the mail If you have any lab test that is abnormal or we need to change your treatment, we will call you to review the results.  Testing/Procedures: none  Follow-Up: At [facility], you and your health needs are our priority.  As part of our continuing mission to provide you with exceptional heart care, we have created designated Provider Care Teams.  These Care Teams include your primary Cardiologist (physician) and Advanced Practice Providers (APPs -  Physician Assistants and Nurse Practitioners) who all work together to provide you with the care you need, when you need it. You will need a follow up appointment in 12 months with [name] and in 24 months with Dr. Quay Burow.  Please call our office 2 months in advance to schedule each appointment.

## 2018-11-08 NOTE — Assessment & Plan Note (Signed)
History of atypical chest pain October of last year with a negative 2D echo and Myoview stress test.  She said no recurrent symptoms.  In retrospect, she thinks her pain was musculoskeletal.

## 2018-11-08 NOTE — Assessment & Plan Note (Signed)
History of PSVT remotely declining ablation by Dr. Rayann Heman in 2015 without recurrent symptoms.

## 2018-11-08 NOTE — Assessment & Plan Note (Signed)
History of hyperlipidemia not on statin therapy with lipid profile performed 11/18/2017 revealing total cholesterol 216, LDL 109 and HDL 86.

## 2018-11-08 NOTE — Assessment & Plan Note (Signed)
History of essential hypertension with blood pressure measured today at 186/78.  Follow-up blood pressure was 152/74.  She is on Toprol.

## 2018-11-08 NOTE — Progress Notes (Signed)
11/08/2018 Brittany Harvey   02-05-1945  [id number]  Primary Physician Mellody Dance, DO Primary Cardiologist: Lorretta Harp MD [facility], [place], [place]  HPI:  Brittany Harvey is a 73 y.o.  moderately overweight widowed Caucasian female who I last saw  11/03/2017.[name] She saw Dr. Sallyanne Kuster back in 2011. She has a history of PSVT remotely back in 2004. She had a false positive GXT back in 2011 which led to a Myoview which was normal. She was admitted to the hospital back in March after I saw her in the office for chest pain/rule outmyocardial infarction. A stress MR I was completely normal. She said since and no recurrent symptoms. The presumed etiology of his of her chest pain was reflux. She saw [name] in the office 11/27/13 after she had been seen at [facility] emergency room for PSVT requiring adenosine conversion. She's had no recurrent symptoms.   When I saw her a year ago, she had developed some atypical back and chest pain associated with some shortness of breath.  The shortness of breath has awakened from sleep as well.  I obtain a 2D echocardiogram which was normal as was a Myoview stress test.  Since I saw her a year ago she is been asymptomatic denying chest pain.  In retrospect, she thinks her chest pain was musculoskeletal in nature.  Current Meds  Medication Sig  . acetaminophen (TYLENOL) 500 MG tablet Take 500 mg by mouth daily as needed (pain).  [name] aspirin EC 81 MG tablet Take 81 mg by mouth daily.  . Calcium Citrate-Vitamin D (CALCIUM CITRATE + D PO) Take 1 tablet by mouth daily.  . Cholecalciferol (VITAMIN D3) 1000 units CAPS Take 5 capsules by mouth daily. Told pt to increase to 5,000 IU per day  . Cranberry 1000 MG CAPS Take 1 capsule by mouth daily.  . fluticasone (FLONASE) 50 MCG/ACT nasal spray Place 1 spray into both nostrils 2 (two) times daily.  [name] ibuprofen (ADVIL,MOTRIN) 600 MG tablet Take 1 tablet (600 mg total) by mouth every 8 (eight) hours as  needed.  . loratadine (CLARITIN) 10 MG tablet Take 1 tablet (10 mg total) by mouth daily.  . metoprolol succinate (TOPROL-XL) 25 MG 24 hr tablet TAKE 1 AND 1/2 TABLETS EVERY DAY  . Multiple Vitamin (MULTIVITAMIN WITH MINERALS) TABS tablet Take 1 tablet by mouth daily. Centrum Silver  . triamcinolone cream (KENALOG) 0.1 % Apply 1 application topically daily as needed (itching/eczema).      Allergies  Allergen Reactions  . Clindamycin/Lincomycin Nausea Only  . Penicillins Anaphylaxis and Swelling  . Tussin Dm [Guaifenesin-Dm] Other (See Comments)    Dizziness/ Lightheadness  . Zithromax [Azithromycin] Other (See Comments)    Pre-syncope    Social History   Socioeconomic History  . Marital status: Married    Spouse name: Not on file  . Number of children: Not on file  . Years of education: Not on file  . Highest education level: Not on file  Occupational History  . Not on file  Social Needs  . Financial resource strain: Not on file  . Food insecurity    Worry: Not on file    Inability: Not on file  . Transportation needs    Medical: Not on file    Non-medical: Not on file  Tobacco Use  . Smoking status: Former Smoker    Quit date: 01/27/1992    Years since quitting: 26.8  . Smokeless tobacco: Never Used  Substance and Sexual Activity  . Alcohol use: No  . Drug use: No  . Sexual activity: Never    Birth control/protection: None  Lifestyle  . Physical activity    Days per week: Not on file    Minutes per session: Not on file  . Stress: Not on file  Relationships  . Social Herbalist on phone: Not on file    Gets together: Not on file    Attends religious service: Not on file    Active member of club or organization: Not on file    Attends meetings of clubs or organizations: Not on file    Relationship status: Not on file  . Intimate partner violence    Fear of current or ex partner: Not on file    Emotionally abused: Not on file    Physically abused:  Not on file    Forced sexual activity: Not on file  Other Topics Concern  . Not on file  Social History Narrative  . Not on file     Review of Systems: General: negative for chills, fever, night sweats or weight changes.  Cardiovascular: negative for chest pain, dyspnea on exertion, edema, orthopnea, palpitations, paroxysmal nocturnal dyspnea or shortness of breath Dermatological: negative for rash Respiratory: negative for cough or wheezing Urologic: negative for hematuria Abdominal: negative for nausea, vomiting, diarrhea, bright red blood per rectum, melena, or hematemesis Neurologic: negative for visual changes, syncope, or dizziness All other systems reviewed and are otherwise negative except as noted above.    Blood pressure (!) 152/74, pulse 65, height 5' 1.5" (1.562 m), weight 149 lb 12.8 oz (67.9 kg), SpO2 96 %.  General appearance: alert and no distress Neck: no adenopathy, no carotid bruit, no JVD, supple, symmetrical, trachea midline and thyroid not enlarged, symmetric, no tenderness/mass/nodules Lungs: clear to auscultation bilaterally Heart: regular rate and rhythm, S1, S2 normal, no murmur, click, rub or gallop Extremities: extremities normal, atraumatic, no cyanosis or edema Pulses: 2+ and symmetric Skin: Skin color, texture, turgor normal. No rashes or lesions Neurologic: Alert and oriented X 3, normal strength and tone. Normal symmetric reflexes. Normal coordination and gait  EKG sinus rhythm at 65 with nonspecific ST and T wave changes.  I personally reviewed this EKG  ASSESSMENT AND PLAN:   PSVT (paroxysmal supraventricular tachycardia) ([facility]) History of PSVT remotely declining ablation by Dr. Rayann Heman in 2015 without recurrent symptoms.  Essential hypertension History of essential hypertension with blood pressure measured today at 186/78.  Follow-up blood pressure was 152/74.  She is on Toprol.  Hyperlipidemia History of hyperlipidemia not on statin therapy  with lipid profile performed 11/18/2017 revealing total cholesterol 216, LDL 109 and HDL 86.  Chest pain History of atypical chest pain October of last year with a negative 2D echo and Myoview stress test.  She said no recurrent symptoms.  In retrospect, she thinks her pain was musculoskeletal.      Lorretta Harp MD [facility], [facility] 11/08/2018 8:58 AM

## 2018-11-15 DIAGNOSIS — M1990 Unspecified osteoarthritis, unspecified site: Secondary | ICD-10-CM | POA: Insufficient documentation

## 2018-11-15 DIAGNOSIS — M810 Age-related osteoporosis without current pathological fracture: Secondary | ICD-10-CM | POA: Insufficient documentation

## 2018-11-16 DIAGNOSIS — Z1231 Encounter for screening mammogram for malignant neoplasm of breast: Secondary | ICD-10-CM | POA: Diagnosis not present

## 2018-11-16 DIAGNOSIS — Z01419 Encounter for gynecological examination (general) (routine) without abnormal findings: Secondary | ICD-10-CM | POA: Diagnosis not present

## 2018-11-16 DIAGNOSIS — Z6828 Body mass index (BMI) 28.0-28.9, adult: Secondary | ICD-10-CM | POA: Diagnosis not present

## 2018-11-17 LAB — HM MAMMOGRAPHY

## 2018-11-23 ENCOUNTER — Telehealth: Payer: Self-pay | Admitting: Family Medicine

## 2018-11-23 NOTE — Telephone Encounter (Signed)
Patient states going to have to start SHINGLES Vaccine series over again because she missed her 2nd one.   --Patient taking 1st one over on Nov 3rd @ [place] and would like nurse to send over order for 2nd shingles vaccine to them anytime after that date ( trying scheduled them for her pay period week.  --Forwarding request to medical assistant.  --Dion Body

## 2018-11-24 NOTE — Telephone Encounter (Signed)
Pt should not need an additional RX for her 2nd dose.  Charyl Bigger, CMA

## 2018-12-26 ENCOUNTER — Other Ambulatory Visit: Payer: Medicare Other

## 2018-12-26 ENCOUNTER — Ambulatory Visit: Payer: Medicare Other | Admitting: Family Medicine

## 2019-01-12 ENCOUNTER — Other Ambulatory Visit: Payer: Medicare Other

## 2019-01-12 ENCOUNTER — Other Ambulatory Visit: Payer: Self-pay

## 2019-01-12 ENCOUNTER — Ambulatory Visit (INDEPENDENT_AMBULATORY_CARE_PROVIDER_SITE_OTHER): Payer: Medicare Other | Admitting: Family Medicine

## 2019-01-12 ENCOUNTER — Encounter: Payer: Self-pay | Admitting: Family Medicine

## 2019-01-12 VITALS — BP 105/67 | HR 64 | Wt 145.6 lb

## 2019-01-12 DIAGNOSIS — I10 Essential (primary) hypertension: Secondary | ICD-10-CM

## 2019-01-12 DIAGNOSIS — E785 Hyperlipidemia, unspecified: Secondary | ICD-10-CM

## 2019-01-12 DIAGNOSIS — E559 Vitamin D deficiency, unspecified: Secondary | ICD-10-CM

## 2019-01-12 DIAGNOSIS — R5382 Chronic fatigue, unspecified: Secondary | ICD-10-CM

## 2019-01-12 DIAGNOSIS — Z1159 Encounter for screening for other viral diseases: Secondary | ICD-10-CM

## 2019-01-12 DIAGNOSIS — Z0000 Encounter for general adult medical examination without abnormal findings: Secondary | ICD-10-CM

## 2019-01-12 DIAGNOSIS — R7303 Prediabetes: Secondary | ICD-10-CM

## 2019-01-12 DIAGNOSIS — E7889 Other lipoprotein metabolism disorders: Secondary | ICD-10-CM

## 2019-01-12 NOTE — Progress Notes (Signed)
Subjective:   Brittany Harvey is a 73 y.o. female who presents for Medicare Annual (Subsequent) preventive examination.   HPI: Notes her only memory concern was with that "one sentence." Notes she thinks she was able to remember the sentence earlier on when it was asked, but she can't remember now. She recalled it minuets later, but not at the time she was asked to recall Notes she returns to the OBGYN yearly, and the provider still checks with a bimanual exam, but does not perform pap smears. Last had her eyes checked in October of this year. Overall denies concerns or questions.   Review of Systems:  A fourteen system review of systems was performed and found to be positive as per HPI.  Objective:     Vitals: BP 105/67   Pulse 64   Wt 145 lb 9.6 oz (66 kg)   BMI 27.07 kg/m   Body mass index is 27.07 kg/m.  Advanced Directives 08/12/2016 08/20/2015 09/09/2014 11/19/2013 11/19/2013 04/26/2013  Does Patient Have a Medical Advance Directive? Yes Yes No No No Patient has advance directive, copy not in chart  Type of Advance Directive Healthcare Power of [place];Living will  Would patient like information on creating a medical advance directive? - - - No - patient declined information No - patient declined information -    Tobacco Social History   Tobacco Use  Smoking Status Former Smoker  . Quit date: 01/27/1992  . Years since quitting: 26.9  Smokeless Tobacco Never Used       Past Medical History:  Diagnosis Date  . Anxiety   . Atypical chest pain   . GERD (gastroesophageal reflux disease)   . Heart palpitations   . Hypertension   . Kidney stones   . PSVT (paroxysmal supraventricular tachycardia) ([place])    Past Surgical History:  Procedure Laterality Date  . CARDIOVASCULAR STRESS TEST  06/19/2009   Post stress myocardial perfusion images show a normal pattern of perfusion in all regions. ECG positive for ischemia.  Appears to be a "false-positive" ECG stress test.  . TUBAL LIGATION      Family History  Adopted: Yes   Social History   Socioeconomic History  . Marital status: Married    Spouse name: Not on file  . Number of children: Not on file  . Years of education: Not on file  . Highest education level: Not on file  Occupational History  . Not on file  Tobacco Use  . Smoking status: Former Smoker    Quit date: 01/27/1992    Years since quitting: 26.9  . Smokeless tobacco: Never Used  Substance and Sexual Activity  . Alcohol use: No  . Drug use: No  . Sexual activity: Never    Birth control/protection: None  Other Topics Concern  . Not on file  Social History Narrative  . Not on file   Social Determinants of Health   Financial Resource Strain:   . Difficulty of Paying Living Expenses: Not on file  Food Insecurity:   . Worried About Charity fundraiser in the Last Year: Not on file  . Ran Out of Food in the Last Year: Not on file  Transportation Needs:   . Lack of Transportation (Medical): Not on file  . Lack of Transportation (Non-Medical): Not on file  Physical Activity:   . Days of Exercise per Week: Not on file  . Minutes of Exercise per  Session: Not on file  Stress:   . Feeling of Stress : Not on file  Social Connections:   . Frequency of Communication with Friends and Family: Not on file  . Frequency of Social Gatherings with Friends and Family: Not on file  . Attends Religious Services: Not on file  . Active Member of Clubs or Organizations: Not on file  . Attends Archivist Meetings: Not on file  . Marital Status: Not on file    Outpatient Encounter Medications as of 01/12/2019  Medication Sig  . acetaminophen (TYLENOL) 500 MG tablet Take 500 mg by mouth daily as needed (pain).  . Apple Cider Vinegar 500 MG TABS apple cider vinegar  . aspirin EC 81 MG tablet Take 81 mg by mouth daily.  . Calcium Citrate-Vitamin D (CALCIUM CITRATE + D PO) Take 1 tablet  by mouth daily.  . Cholecalciferol (VITAMIN D3) 1000 units CAPS Take 5 capsules by mouth daily. Told pt to increase to 5,000 IU per day  . Cranberry 1000 MG CAPS Take 1 capsule by mouth daily.  . fluticasone (FLONASE) 50 MCG/ACT nasal spray Place 1 spray into both nostrils 2 (two) times daily.  [name] ibuprofen (ADVIL,MOTRIN) 600 MG tablet Take 1 tablet (600 mg total) by mouth every 8 (eight) hours as needed.  . metoprolol succinate (TOPROL-XL) 25 MG 24 hr tablet TAKE 1 AND 1/2 TABLETS EVERY DAY  . Multiple Vitamin (MULTIVITAMIN WITH MINERALS) TABS tablet Take 1 tablet by mouth daily. Centrum Silver  . triamcinolone cream (KENALOG) 0.1 % Apply 1 application topically daily as needed (itching/eczema).   . [DISCONTINUED] loratadine (CLARITIN) 10 MG tablet Take 1 tablet (10 mg total) by mouth daily. (Patient not taking: Reported on 01/12/2019)   No facility-administered encounter medications on file as of 01/12/2019.    Activities of Daily Living In your present state of health, do you have any difficulty performing the following activities: 01/12/2019  Hearing? N  Vision? N  Difficulty concentrating or making decisions? N  Walking or climbing stairs? N  Dressing or bathing? N  Doing errands, shopping? N  Some recent data might be hidden    Patient Care Team: Mellody Dance, DO as PCP - General (Family Medicine) Lorretta Harp, MD as PCP - Cardiology (Cardiology) Juanita Craver, MD as Consulting Physician (Gastroenterology) Lorretta Harp, MD as Consulting Physician (Cardiology) Everlene Farrier, MD as Consulting Physician (Obstetrics and Gynecology)     Assessment:   This is a routine wellness examination for Brittany Harvey.  - discussed AHA guidelines for heart healthy/ mediterranean diet along with AHA goal of at least 150 min/week of moderate intensity aerobic activity. - recommend wt bearing activities for bone health - recommend "brain activities" esp something like learning a new  language or a new instrument to help prevent memory issues as pt ages  Fall Risk Fall Risk  01/12/2019 09/22/2018 12/22/2017 01/27/2017 11/10/2016  Falls in the past year? 0 0 0 No No  Number falls in past yr: 0 0 - - -  Injury with Fall? 0 - - - -  Follow up Falls evaluation completed Falls evaluation completed - - -   Is the patient's home free of loose throw rugs in walkways, pet beds, electrical cords, etc?   yes      Grab bars in the bathroom? yes      Handrails on the stairs?   yes      Adequate lighting?   yes  Timed Get Up and Go  performed: Telehealth  Depression Screen PHQ 2/9 Scores 01/12/2019 09/22/2018 06/28/2018 03/16/2018  PHQ - 2 Score 0 0 0 0  PHQ- 9 Score 0 0 0 0     Cognitive Function     6CIT Screen 01/12/2019 12/22/2017  What Year? 0 points 0 points  What month? 0 points 0 points  What time? 0 points 0 points  Count back from 20 0 points 0 points  Months in reverse 0 points 0 points  Repeat phrase 3 points 0 points  Total Score 3 0  --> pt recalled 1/2 of the sentence minutes later, not at time she was asked to recall.    Immunization History  Administered Date(s) Administered  . Influenza, High Dose Seasonal PF 10/31/2015, 10/29/2016, 11/18/2017  . Influenza-Unspecified 11/04/2018  . Pneumococcal Conjugate-13 05/10/2015  . Pneumococcal Polysaccharide-23 01/18/2013  . Tdap 12/09/2010, 09/30/2012  . Zoster Recombinat (Shingrix) 12/22/2017     Qualifies for Shingles Vaccine?  Patient declines Shingrix at this time  Pneumococcal Vaccine: UTD  Influenza Vaccine: UTD   Screening Tests Health Maintenance  Topic Date Due  . MAMMOGRAM  06/28/2019 (Originally 05/19/2018)  . TETANUS/TDAP  10/01/2022  . COLONOSCOPY  09/23/2023  . INFLUENZA VACCINE  Completed  . DEXA SCAN  Completed  . Hepatitis C Screening  Completed  . PNA vac Low Risk Adult  Completed    Cancer Screenings:  Lung: Low Dose CT Chest recommended if Age 23-80 years, 30 pack-year  currently smoking OR have quit w/in 15years. Patient does not qualify.  Breast:  Up to date on Mammogram? Yes done Oct 21st 2020- reported as normal.   Up to date of Bone Density/Dexa? Yes done 2019- gets through her GYN's office- Dr Graciella Freer; pt knows she is due for this in Fall 2021  Colorectal: Cologuard done 2020- may 2020- was negative  - last colonoscopy done by Tyrone Sage Sept 2015 or 16- told repeat in 5 yrs due to polyps found.   Additional Screenings:  Hepatitis C Screening: future order placed      Plan:    PLAN:  - Patient had her lab work drawn today as advised.   - Reviewed signs of normal age-related memory loss with patient today. Education provided. Normal memory screening test results  - Last mammogram obtained November 16, 2018, WNL, through Dr. Sherlynn Stalls office.  - Per pt, last bone density DEXA obtained 2019 at same practice. Knows she is due 2021, also through Dr. Sherlynn Stalls office.   - Patient last had colonoscopy in September 2016 with Dr. Collene Mares, and was told to repeat in 5 years.  - Per patient, did have polyps removed last colonoscopy.  - Last ColoGuard obtained 06/08/2018, negative, with 3 year repeat. - Advised patient to call Dr. Lorie Apley office and inquire regarding need for further colonoscopy, given recent negative ColoGuard.   - Influenza vaccination obtained recently at pharmacy.   - Per patient, one round of Shingrix vaccine obtained in past. - Reviewed guidelines for receiving second dose of Shingrix with patient today.  NO NEED TO REPEAT  Obtain second dose as soon as possible. - Encouraged patient to visit CDC website for further education if desired.   - Discussed need to have all applicable records of screenings sent to office as recommended.   - Health counseling performed and all questions answered.   - Return in 4 months for chronic follow-up.    I have personally reviewed and noted the following in the patient's chart:   .  Medical and  social history . Use of alcohol, tobacco or illicit drugs  . Current medications and supplements . Functional ability and status . Nutritional status . Physical activity . Advanced directives . List of other physicians . Hospitalizations, surgeries, and ER visits in previous 12 months . Vitals . Screenings to include cognitive, depression, and falls . Referrals and appointments  In addition, I have reviewed and discussed with patient certain preventive protocols, quality metrics, and best practice recommendations. A written personalized care plan for preventive services as well as general preventive health recommendations were provided to patient.

## 2019-01-13 LAB — COMPREHENSIVE METABOLIC PANEL
ALT: 13 IU/L (ref 0–32)
AST: 21 IU/L (ref 0–40)
Albumin/Globulin Ratio: 1.8 (ref 1.2–2.2)
Albumin: 4.6 g/dL (ref 3.7–4.7)
Alkaline Phosphatase: 80 IU/L (ref 39–117)
BUN/Creatinine Ratio: 13 (ref 12–28)
BUN: 9 mg/dL (ref 8–27)
Bilirubin Total: 0.7 mg/dL (ref 0.0–1.2)
CO2: 23 mmol/L (ref 20–29)
Calcium: 9.4 mg/dL (ref 8.7–10.3)
Chloride: 107 mmol/L — ABNORMAL HIGH (ref 96–106)
Creatinine, Ser: 0.69 mg/dL (ref 0.57–1.00)
GFR calc Af Amer: 101 mL/min/{1.73_m2} (ref >59)
GFR calc non Af Amer: 87 mL/min/{1.73_m2} (ref >59)
Globulin, Total: 2.6 g/dL (ref 1.5–4.5)
Glucose: 90 mg/dL (ref 65–99)
Potassium: 4.2 mmol/L (ref 3.5–5.2)
Sodium: 143 mmol/L (ref 134–144)
Total Protein: 7.2 g/dL (ref 6.0–8.5)

## 2019-01-13 LAB — LIPID PANEL
Chol/HDL Ratio: 2.3 ratio (ref 0.0–4.4)
Cholesterol, Total: 211 mg/dL — ABNORMAL HIGH (ref 100–199)
HDL: 90 mg/dL (ref >39)
LDL Chol Calc (NIH): 107 mg/dL — ABNORMAL HIGH (ref 0–99)
Triglycerides: 79 mg/dL (ref 0–149)
VLDL Cholesterol Cal: 14 mg/dL (ref 5–40)

## 2019-01-13 LAB — CBC WITH DIFFERENTIAL/PLATELET
Basophils Absolute: 0.1 10*3/uL (ref 0.0–0.2)
Basos: 1 %
EOS (ABSOLUTE): 0.2 10*3/uL (ref 0.0–0.4)
Eos: 2 %
Hematocrit: 38.3 % (ref 34.0–46.6)
Hemoglobin: 13.2 g/dL (ref 11.1–15.9)
Immature Grans (Abs): 0 10*3/uL (ref 0.0–0.1)
Immature Granulocytes: 0 %
Lymphocytes Absolute: 3 10*3/uL (ref 0.7–3.1)
Lymphs: 43 %
MCH: 30.3 pg (ref 26.6–33.0)
MCHC: 34.5 g/dL (ref 31.5–35.7)
MCV: 88 fL (ref 79–97)
Monocytes Absolute: 0.5 10*3/uL (ref 0.1–0.9)
Monocytes: 7 %
Neutrophils Absolute: 3.3 10*3/uL (ref 1.4–7.0)
Neutrophils: 47 %
Platelets: 277 10*3/uL (ref 150–450)
RBC: 4.36 x10E6/uL (ref 3.77–5.28)
RDW: 12.9 % (ref 11.7–15.4)
WBC: 7.1 10*3/uL (ref 3.4–10.8)

## 2019-01-13 LAB — TSH: TSH: 1.4 u[IU]/mL (ref 0.450–4.500)

## 2019-01-13 LAB — VITAMIN D 25 HYDROXY (VIT D DEFICIENCY, FRACTURES): Vit D, 25-Hydroxy: 46.5 ng/mL (ref 30.0–100.0)

## 2019-01-13 LAB — HEMOGLOBIN A1C
Est. average glucose Bld gHb Est-mCnc: 114 mg/dL
Hgb A1c MFr Bld: 5.6 % (ref 4.8–5.6)

## 2019-01-13 LAB — T4, FREE: Free T4: 1.26 ng/dL (ref 0.82–1.77)

## 2019-01-13 LAB — T3: T3, Total: 112 ng/dL (ref 71–180)

## 2019-01-18 ENCOUNTER — Other Ambulatory Visit: Payer: Self-pay | Admitting: Cardiovascular Disease

## 2019-02-02 ENCOUNTER — Encounter: Payer: Self-pay | Admitting: Family Medicine

## 2019-02-02 NOTE — Progress Notes (Signed)
Unable to reach patient via phone. Sending letter with lab results and instructions from Dr. Raliegh Scarlet. AS,CMA

## 2019-02-13 ENCOUNTER — Other Ambulatory Visit: Payer: Self-pay

## 2019-02-13 ENCOUNTER — Ambulatory Visit: Payer: Medicare Other | Admitting: Family Medicine

## 2019-02-27 ENCOUNTER — Ambulatory Visit (INDEPENDENT_AMBULATORY_CARE_PROVIDER_SITE_OTHER): Payer: Medicare Other | Admitting: Family Medicine

## 2019-02-27 ENCOUNTER — Other Ambulatory Visit: Payer: Self-pay

## 2019-02-27 ENCOUNTER — Encounter: Payer: Self-pay | Admitting: Family Medicine

## 2019-02-27 VITALS — BP 121/71 | HR 64 | Temp 97.2°F | Ht 61.5 in | Wt 142.6 lb

## 2019-02-27 DIAGNOSIS — J3089 Other allergic rhinitis: Secondary | ICD-10-CM | POA: Diagnosis not present

## 2019-02-27 DIAGNOSIS — R7303 Prediabetes: Secondary | ICD-10-CM | POA: Diagnosis not present

## 2019-02-27 DIAGNOSIS — E7889 Other lipoprotein metabolism disorders: Secondary | ICD-10-CM | POA: Diagnosis not present

## 2019-02-27 DIAGNOSIS — I10 Essential (primary) hypertension: Secondary | ICD-10-CM | POA: Diagnosis not present

## 2019-02-27 DIAGNOSIS — E785 Hyperlipidemia, unspecified: Secondary | ICD-10-CM | POA: Diagnosis not present

## 2019-02-27 DIAGNOSIS — Z79899 Other long term (current) drug therapy: Secondary | ICD-10-CM

## 2019-02-27 MED ORDER — ROSUVASTATIN CALCIUM 10 MG PO TABS: ORAL_TABLET | ORAL | 3 refills | Status: DC

## 2019-02-27 NOTE — Progress Notes (Signed)
Telehealth office visit note for Brittany Harvey, D.O- at Primary Care at [facility]   I connected with current patient today and verified that I am speaking with the correct person using two identifiers.   . Location of the patient: Home . Location of the provider: Office Only the patient (+/- their family members at pt's discretion) and myself were participating in the encounter - This visit type was conducted due to national recommendations for restrictions regarding the COVID-19 Pandemic (e.g. social distancing) in an effort to limit this patient's exposure and mitigate transmission in our community.  This format is felt to be most appropriate for this patient at this time.   - No physical exam could be performed with this format, beyond that communicated to [place] by the patient/ family members as noted.   - Additionally my office staff/ schedulers discussed with the patient that there may be a monetary charge related to this service, depending on their medical insurance.   The patient expressed understanding, and agreed to proceed.       History of Present Illness: Follow-up   I, Toni Amend, am serving as scribe for Dr. Mellody Harvey.  Patient notes she is doing great today.  She obtained her first COVID-19 vaccination and notes "it made me hyper."  The patient is feeling well overall, mainly wondering about the side-effects of statin/cholesterol medications.  She is also curious about the possible health benefits of supplemental flax and cumin.  - Eating Habits She mostly eats chicken, "can do without red meat."  Says she was recently "getting carried away with the fried chicken at work," because it was really tasty.  Says she has been craving bacon for three months but has yet to have eaten a piece.  - Use of OTC medications Patient takes tylenol very seldom, "and only takes on" instead of two.  Notes "I have to be hurting pretty bad to take anything."  -  Weight Loss Notes she's lost weight down to 142 from 150-something prior, "just trying to eat right."  - Environmental & Seasonal Allergies Her allergies are fine; "I don't usually get them until the pollen comes."  She has "flakes that look like dandruff" on her eyelids/eyelashes, and notes she will be using a special soap to wash this.  HPI:  Hyperlipidemia:  74 y.o. female here for cholesterol follow-up.   Notes she has been eating more cheese recently; states "that's something I didn't normally do."  She has also been eating Cheez-its.  She is willing to begin statin management today, taking a half tablet Sunday night through Thursday night, with Friday and Saturday off.  Notes she doesn't take many medications, and typically doesn't take any pills at all over the weekend.  Notes she was taking krill oil in the past, "but my burp was awful."  - She denies new onset of: myalgias, arthralgias, increased fatigue more than normal, chest pains, exercise intolerance, shortness of breath, dizziness, visual changes, headache, lower extremity swelling or claudication.   Most recent cholesterol panel was:  Lab Results  Component Value Date   CHOL 211 (H) 01/12/2019   HDL 90 01/12/2019   LDLCALC 107 (H) 01/12/2019   TRIG 79 01/12/2019   CHOLHDL 2.3 01/12/2019   Hepatic Function Latest Ref Rng & Units 01/12/2019 11/18/2017 08/14/2016  Total Protein 6.0 - 8.5 g/dL 7.2 7.3 7.3  Albumin 3.7 - 4.7 g/dL 4.6 4.4 4.4  AST 0 - 40 IU/L 21 22  20  ALT 0 - 32 IU/L '13 19 17  ' Alk Phosphatase 39 - 117 IU/L 80 60 63  Total Bilirubin 0.0 - 1.2 mg/dL 0.7 0.6 0.7   HPI:  Hypertension:  -  Her blood pressure at home has been running: 121/71 in one arm, 112/74 in the other arm, with a pulse of 69.  She continues to follow up with her cardiologist as established.  - Patient reports good compliance with medication and/or lifestyle modification  - Her denies acute concerns or problems related to treatment  plan  - She denies new onset of: chest pain, exercise intolerance, shortness of breath, dizziness, visual changes, headache, lower extremity swelling or claudication.   Last 3 blood pressure readings in our office are as follows: BP Readings from Last 3 Encounters:  02/27/19 121/71  01/12/19 105/67  11/08/18 (!) 152/74   Filed Weights   02/27/19 1513  Weight: 142 lb 9.6 oz (64.7 kg)      GAD 7 : Generalized Anxiety Score 08/12/2016  Nervous, Anxious, on Edge 1  Control/stop worrying 1  Worry too much - different things 1  Trouble relaxing 0  Restless 0  Easily annoyed or irritable 0  Afraid - awful might happen 0  Total GAD 7 Score 3  Anxiety Difficulty Not difficult at all    Depression screen [facility] 2/9 01/12/2019 09/22/2018 06/28/2018 03/16/2018 12/22/2017  Decreased Interest 0 0 0 0 0  Down, Depressed, Hopeless 0 0 0 0 0  PHQ - 2 Score 0 0 0 0 0  Altered sleeping 0 0 0 0 1  Tired, decreased energy 0 0 0 0 1  Change in appetite 0 0 0 0 1  Feeling bad or failure about yourself  0 0 0 0 0  Trouble concentrating 0 0 0 0 0  Moving slowly or fidgety/restless 0 0 0 0 0  Suicidal thoughts 0 0 0 0 0  PHQ-9 Score 0 0 0 0 3  Difficult doing work/chores - Not difficult at all Not difficult at all Not difficult at all Not difficult at all  Some recent data might be hidden      Impression and Recommendations:    1. Hyperlipidemia, unspecified hyperlipidemia type   2. Elevated HDL = 90   3. High risk medications (not anticoagulants) long-term use   4. Essential hypertension   5. White coat syndrome with hypertension   6. h/o Prediabetes   7. Environmental and seasonal allergies      - Reviewed recent lab work (01/12/2019) in depth with patient today.  All lab work within normal limits unless otherwise noted.  Extensive health education provided and all questions answered.   Hyperlipidemia-->   Still poorly controlled  - Reviewed patient's recent fasting lipid panel during  appointment.  - Last FLP obtained one month ago: Triglycerides = 79, WNL. HDL = 90, elevated, at goal. LDL = 107, elevated, suboptimally controlled.  The 10-year ASCVD risk score Mikey Bussing DC Jr., et al., 2013) is: 14%  - Strongly recommended beginning statin management today. - Reviewed that while managed on statins, aching of muscles and joints may occur in patients who are inadequately hydrated and do not engage in regular physical activity.  - Patient agrees to begin taking a half tablet of Crestor (5 mg) nightly, Sunday-Thursday night, with Friday and Saturday off.  - To reduce chance of S-E on statin, encouraged patient to drink adequate water and exercise regularly.  - Discussed that lifestyle and genetics both  play large roles in cholesterol mitigation. - Advised patient to review the nutrition facts of any food she plans to eat.   - Encouraged patient to avoid processed and prepackaged foods.  - To improve LDL, dietary changes such as low saturated & trans fat diets for hyperlipidemia and low carb diets for hypertriglyceridemia discussed with patient.    - To maintain HDL WNL, encouraged patient to follow AHA guidelines for regular exercise, obtaining 30-45 minutes of moderate-intensity aerobic activity daily.  - We will continue to monitor and re-check as discussed. - If patient experiences any concerns while on Crestor, she will call in to clinic prior to f/up appt.   Essential Hypertension, White Coat Syndrome w/ Hypertension - Blood pressure currently is at goal, stable, well-controlled. - Patient will continue current treatment regimen.  See med list. - Continue follow-up with cardiology as established.  - Counseled patient on pathophysiology of disease and discussed various treatment options, which always includes dietary and lifestyle modification as first line.   - Lifestyle changes such as dash and heart healthy diets and engaging in a regular exercise program  discussed extensively with patient.   - Ambulatory blood pressure monitoring encouraged at least 3 times weekly.  Keep log and bring in every office visit.  Reminded patient that if they ever feel poorly in any way, to check their blood pressure and pulse.  - We will continue to monitor.   History of Prediabetes - A1c 5.6 one month ago, at goal, stable from 5.5 prior. - Counseled patient on prevention of diabetes and discussed dietary and lifestyle modification as first line.    - Importance of low carb, heart-healthy diet discussed with patient in addition to regular aerobic exercise of 15mn 5d/week or more.   - Will continue to monitor and re-check as discussed.   Vitamin D Deficiency - Last measured at 46.5, stable from 50.7 prior.  At goal.  - Discussed goal of maintenance in 40-60 range. - Continue supplementation as prescribed. - Will continue to monitor and re-check as discussed.   Environmental and Seasonal Allergies - Stable at this time. - Per patient, her allergies worsen when the pollen arrives. - Continue management as established. - Will continue to monitor.   COVID-19 Counseling, Vaccination Counseling - Extensive COVID-19 counseling provided today.  All questions answered. - Discussion held with patient regarding vaccination and expectations moving forward. - Patient knows that she may call any time with further questions or concerns. -  Will continue to monitor.   Health Counseling & Preventative Maintenance - Advised patient to continue working toward exercising to improve overall mental, physical, and emotional health.    - Reviewed the "spokes of the wheel" of mood and health management.  Stressed the importance of ongoing prudent habits, including regular exercise, appropriate sleep hygiene, healthful dietary habits, and prayer/meditation to relax.  - Encouraged patient to engage in daily physical activity, especially a formal exercise routine.   Recommended that the patient eventually strive for at least 150 minutes of moderate cardiovascular activity per week according to guidelines established by the [facility]   - Healthy dietary habits encouraged, including low-carb, and high amounts of lean protein in diet.   - Patient should also consume adequate amounts of water.  - Health counseling performed.  All questions answered.   - As part of my medical decision making, I reviewed the following data [place] the [place] obtained from pt /family, CMA notes reviewed and incorporated if applicable,  Labs reviewed, Radiograph/ tests reviewed if applicable and OV notes from prior OV's with me, as well as other specialists she/he has seen since seeing me last, were all reviewed and used in my medical decision making process today.    - Additionally, discussion had with patient regarding our treatment plan, and their biases/concerns about that plan were used in my medical decision making today.    - The patient agreed with the plan and demonstrated an understanding of the instructions.   No barriers to understanding were identified.     Return for f/up 2 months for lab-only FLP, liver enzymes, with telehealth OV 3-5 days later.     Orders Placed This Encounter  Procedures  . ALT  . Lipid panel     Meds ordered this encounter  Medications  . rosuvastatin (CRESTOR) 10 MG tablet    Sig: 1/2 tab q hs on weekdays    Dispense:  90 tablet    Refill:  3    I provided 30+ minutes of non face-to-face time during this encounter.  Additional time was spent with charting and coordination of care before and after the actual visit commenced.   Note:  This note was prepared with assistance of Dragon voice recognition software. Occasional wrong-word or sound-a-like substitutions may have occurred due to the inherent limitations of voice recognition software.  This document serves as a record of services personally performed  by Brittany Dance, DO. It was created on her behalf by Toni Amend, a trained medical scribe. The creation of this record is based on the scribe's personal observations and the provider's statements to them.   This case required medical decision making of at least moderate complexity. The above documentation has been reviewed to be accurate and was completed by Marjory Sneddon, D.O.      Patient Care Team    Relationship Specialty Notifications Start End  Brittany Dance, DO PCP - General Family Medicine  08/19/15   Lorretta Harp, MD PCP - Cardiology Cardiology  11/03/17   Juanita Craver, MD Consulting Physician Gastroenterology  09/24/15   Lorretta Harp, MD Consulting Physician Cardiology  09/24/15   Everlene Farrier, MD Consulting Physician Obstetrics and Gynecology  09/24/15    Comment: Juanda Chance, NP     -Vitals obtained; medications/ allergies reconciled;  personal medical, social, Sx etc.histories were updated by CMA, reviewed by me and are reflected in chart   Patient Active Problem List   Diagnosis Date Noted  . White coat syndrome with hypertension 03/15/2017  . Prediabetes 09/24/2015  . Hyperlipidemia 08/20/2015  . Hypertensive disorder 07/24/2014  . PSVT (paroxysmal supraventricular tachycardia) ([place]) 03/27/2013  . Elevated HDL = 90 09/24/2015  . Fatigue 09/24/2015  . GERD (gastroesophageal reflux disease) 11/27/2013  . Vitamin D deficiency 11/10/2016  . Environmental and seasonal allergies 06/10/2016  . Upper back strain 05/07/2016  . Overweight (BMI 25.0-29.9) 09/24/2015  . Osteopenia by Dexa (8-9 yrs ago)  08/20/2015  . High risk medications (not anticoagulants) long-term use 02/27/2019  . Arthritis 11/15/2018  . Osteoporosis 11/15/2018  . Family history of stroke- daughter age 31 09/22/2018  . History of nephrolithiasis 07/22/2018  . Statin declined 03/16/2018  . Stressful job 08/12/2016  . Tiredness 08/12/2016  . Nonintractable headache  08/12/2016  . Other acute fatigue 08/12/2016  . Benign positional vertigo, bilateral 06/10/2016  . Cough 03/06/2016  . Chest pain 04/25/2013     Current Meds  Medication Sig  . acetaminophen (TYLENOL) 500 MG tablet  Take 500 mg by mouth daily as needed (pain).  . Apple Cider Vinegar 500 MG TABS apple cider vinegar  . aspirin EC 81 MG tablet Take 81 mg by mouth daily.  . Calcium Citrate-Vitamin D (CALCIUM CITRATE + D PO) Take 1 tablet by mouth daily.  . Cholecalciferol (VITAMIN D3) 1000 units CAPS Take 5 capsules by mouth daily. Told pt to increase to 5,000 IU per day  . Cranberry 1000 MG CAPS Take 1 capsule by mouth daily.  . fluticasone (FLONASE) 50 MCG/ACT nasal spray Place 1 spray into both nostrils 2 (two) times daily.  [name] ibuprofen (ADVIL,MOTRIN) 600 MG tablet Take 1 tablet (600 mg total) by mouth every 8 (eight) hours as needed.  . metoprolol succinate (TOPROL-XL) 25 MG 24 hr tablet TAKE 1 AND 1/2 TABLETS EVERY DAY  . Multiple Vitamin (MULTIVITAMIN WITH MINERALS) TABS tablet Take 1 tablet by mouth daily. Centrum Silver  . triamcinolone cream (KENALOG) 0.1 % Apply 1 application topically daily as needed (itching/eczema).      Allergies:  Allergies  Allergen Reactions  . Clindamycin/Lincomycin Nausea Only  . Penicillins Anaphylaxis and Swelling  . Tussin Dm [Guaifenesin-Dm] Other (See Comments)    Dizziness/ Lightheadness  . Zithromax [Azithromycin] Other (See Comments)    Pre-syncope     ROS:  See above HPI for pertinent positives and negatives   Objective:   Blood pressure 121/71, pulse 64, temperature (!) 97.2 F (36.2 C), temperature source Oral, height 5' 1.5" (1.562 m), weight 142 lb 9.6 oz (64.7 kg).  (if some vitals are omitted, this means that patient was UNABLE to obtain them even though they were asked to get them prior to OV today.  They were asked to call us at their earliest convenience with these once obtained. )  General: A & O * 3; sounds in no acute  distress; in usual state of health.  Skin: Pt confirms warm and dry extremities and pink fingertips HEENT: Pt confirms lips non-cyanotic Chest: Patient confirms normal chest excursion and movement Respiratory: speaking in full sentences, no conversational dyspnea; patient confirms no use of accessory muscles Psych: insight appears good, mood- appears full

## 2019-03-08 ENCOUNTER — Telehealth: Payer: Self-pay | Admitting: Family Medicine

## 2019-03-08 NOTE — Telephone Encounter (Signed)
Patient left a voicemail stating that she had the [place] and was keeping a headache since then and had some questions regarding this. Please Advise. AM

## 2019-03-08 NOTE — Telephone Encounter (Signed)
Patient called states has been having nagging Headaches since 2/1 when she received her 1st COVID Vaccine-- wonders if this is a side effect or could it be something else-- she states HA have eased off since Sunday but is concerned & ask that someone call her @ [phone] you can leave message on Cellphone voicemail. She will be working and may not be able to answer phone.  --glh

## 2019-03-08 NOTE — Telephone Encounter (Signed)
Left message for patient to call our office back regarding below. AS, CMA

## 2019-03-09 NOTE — Telephone Encounter (Signed)
See other opened message from this day. On same issue. AS, CMA

## 2019-03-09 NOTE — Telephone Encounter (Signed)
This is very likely a side effect pressure of the Covid vaccine.  Please have her drink at least half of her weight in ounces of water per day or more as dehydration can significantly worsen headaches.  Also please have her take Advil, Tylenol or Aleve or what ever she typically would take for headaches.

## 2019-03-09 NOTE — Telephone Encounter (Signed)
Patient called states has been having nagging Headaches since 2/1 when she received her 1st COVID Vaccine-- wonders if this is a side effect or could it be something else-- she states HA have eased off since Sunday but is concerned & ask that someone call her @ [phone] you can leave message on Cellphone voicemail. She will be working and may not be able to answer phone.   --glh

## 2019-03-09 NOTE — Telephone Encounter (Signed)
Called patient and advised her of the below. She verbalized understanding and says she is feeling better. States she is going start taking Statin drug recently rxed by [facility]. AS, CMA

## 2019-03-15 ENCOUNTER — Telehealth: Payer: Self-pay | Admitting: Family Medicine

## 2019-03-15 NOTE — Telephone Encounter (Signed)
Attempted to call patient back regarding the below with no answer. Left message. AS, CMA

## 2019-03-15 NOTE — Telephone Encounter (Signed)
Patient is requesting a call back from clinic staff regarding her Crestor, she states she get back acid reflux when she first takes it and wants to know if that's normal and that she is not doing any esophagus/stomach damage from this feeling. Please advise

## 2019-03-15 NOTE — Telephone Encounter (Signed)
Patient states that she is having some indigestion issues when taking the Crestor. It only bothers her when she doesn't eat when taking. She states that she seldomly eats after 6 pm and doesn't want to have to eat when taking the med.   I advised patient to try taking the med with a glass of milk and see if that helps. I advised I would forward message to provider to review and call her back to advise what Dr. Raliegh Harvey thinks. AS, CMA

## 2019-03-15 NOTE — Telephone Encounter (Signed)
Yes thanks for letting her know that however, if she cannot tolerate it by taking it before bedtime even with milk, let her know she can take it after she eats dinner.  Then is better than not being able to take it at all.    Thanks.

## 2019-03-16 NOTE — Telephone Encounter (Signed)
Patient is aware of the below and states that she didn't have any trouble last night when she drank a glass of milk with the medication but that she would also try taking it with her last meal of the day to see which time works best. Patient advised to call back with any questions or concerns. AS, CMA

## 2019-03-28 ENCOUNTER — Telehealth: Payer: Self-pay | Admitting: Family Medicine

## 2019-03-28 NOTE — Telephone Encounter (Signed)
Patient is aware of the below and verbalized understanding. AS< CMA 

## 2019-03-28 NOTE — Telephone Encounter (Signed)
Sorry, that was my mistake.  Patient received 2nd vaccine yesterday, 03/27/19. AS, CMA

## 2019-03-28 NOTE — Telephone Encounter (Signed)
Is important to know when patient received her second vaccine.  Please find out and let me know.

## 2019-03-28 NOTE — Telephone Encounter (Signed)
Patient calling stating that she received her 2nd covid vaccine and that she is having severe headaches with a fever of 100.7  I advised patient she could take Tylenol for the headache and temp. Patient requesting additional advise from provider. AS, CMA

## 2019-03-28 NOTE — Telephone Encounter (Signed)
Got it.    Please let patient know that these types of symptoms as a side effect to the vaccine are common, these can last 1-3 days and should decrease and spontaneously resolve as quickly as they come on.  Let her know that they had given her handouts when she received her vaccine of common side effects and what to do if you develop them as well as a number to call to discuss her side effects.  This is important for tracking purposes to ensure we are monitoring patient's reactions to the vaccine

## 2019-04-24 ENCOUNTER — Ambulatory Visit: Payer: Medicare Other | Admitting: Family Medicine

## 2019-05-09 ENCOUNTER — Other Ambulatory Visit: Payer: Medicare Other

## 2019-05-09 ENCOUNTER — Other Ambulatory Visit: Payer: Self-pay

## 2019-05-09 DIAGNOSIS — Z79899 Other long term (current) drug therapy: Secondary | ICD-10-CM

## 2019-05-09 DIAGNOSIS — E7889 Other lipoprotein metabolism disorders: Secondary | ICD-10-CM

## 2019-05-09 DIAGNOSIS — E785 Hyperlipidemia, unspecified: Secondary | ICD-10-CM

## 2019-05-09 DIAGNOSIS — Z1159 Encounter for screening for other viral diseases: Secondary | ICD-10-CM

## 2019-05-10 LAB — HEPATITIS C ANTIBODY: Hep C Virus Ab: <0.1 s/co ratio (ref 0.0–0.9)

## 2019-05-10 LAB — LIPID PANEL
Chol/HDL Ratio: 2 ratio (ref 0.0–4.4)
Cholesterol, Total: 169 mg/dL (ref 100–199)
HDL: 85 mg/dL (ref >39)
LDL Chol Calc (NIH): 69 mg/dL (ref 0–99)
Triglycerides: 79 mg/dL (ref 0–149)
VLDL Cholesterol Cal: 15 mg/dL (ref 5–40)

## 2019-05-10 LAB — ALT: ALT: 11 IU/L (ref 0–32)

## 2019-05-31 ENCOUNTER — Other Ambulatory Visit: Payer: Self-pay | Admitting: Cardiovascular Disease

## 2019-06-05 ENCOUNTER — Telehealth: Payer: Self-pay | Admitting: Physician Assistant

## 2019-06-05 NOTE — Telephone Encounter (Signed)
Patient is requesting to speak with clinic staff about her Crestor. She only takes in on the weekdays and when she does take it, she has bilateral LE weakness/pain. When the weekend comes and she does not have to take this med, she has no issues. She wants to speak with clinic staff to see if this is normal or if she needs to switch meds. Patient will be at work until 3:30 pm today, please contact at home number after this time.

## 2019-06-05 NOTE — Telephone Encounter (Signed)
Per patient's symptoms and onset sounds like possible side effects from Crestor. Has she noticed any dark urine? If she has, then I would recommend stopping Crestor. Otherwise, I recommend alternating dose and take only on M,W,F. She would continue same Crestor dose of 0.5 tablet of 10 mg. Or we can switch to a lower intensity statin with less risk for myalgias such as pravastatin. Encourage adequate hydration of at least 64 fl ounces to help reduce side effects. Let me know what patient prefers.    Thank you, Herb Grays

## 2019-06-05 NOTE — Telephone Encounter (Signed)
Patient denies any dark colored urine.   Patient agreeable to taking same dose of Crestor M, W, F for 2-3 weeks to see if she has any side effects. Patient will call our office if she does.   Patient aware to drink at least 64 oz of water daily.  AS, CMA

## 2019-06-05 NOTE — Telephone Encounter (Signed)
Please review the below message and advise if this is concerning and if patient should d/c Crestor? AS, CMA

## 2019-06-08 ENCOUNTER — Other Ambulatory Visit: Payer: Self-pay

## 2019-06-08 ENCOUNTER — Ambulatory Visit (INDEPENDENT_AMBULATORY_CARE_PROVIDER_SITE_OTHER): Payer: Medicare Other | Admitting: Orthopedic Surgery

## 2019-06-08 ENCOUNTER — Ambulatory Visit (INDEPENDENT_AMBULATORY_CARE_PROVIDER_SITE_OTHER): Payer: Medicare Other

## 2019-06-08 DIAGNOSIS — M533 Sacrococcygeal disorders, not elsewhere classified: Secondary | ICD-10-CM

## 2019-06-10 ENCOUNTER — Encounter: Payer: Self-pay | Admitting: Orthopedic Surgery

## 2019-06-10 NOTE — Progress Notes (Signed)
Office Visit Note   Patient: Brittany Harvey           Date of Birth: 09-19-1945           MRN: [id number] Visit Date: 06/08/2019 Requested by: Mellody Dance, DO [address] [place],   [id number] PCP: Lorrene Reid, PA-C  Subjective: Chief Complaint  Patient presents with  . Spine - Pain    HPI: Brittany Harvey is a patient with 1 month history of sacrum and coccygeal pain with some mild bilateral leg pain.  Denies a history of injury.  States that she was put on Crestor and it was stopped due to multiple joint pains and muscle pains.  She no longer has any symptoms other than some focal pain in the sacral region.  Denies any pain with bowel movements.  She is okay to sit and no real pain going from sitting to standing.  When the dog pulls her she does report some pain.  She is okay with walking.  Takes Tylenol for symptoms.  Denies any urinary symptoms.              ROS: All systems reviewed are negative as they relate to the chief complaint within the history of present illness.  Patient denies  fevers or chills.   Assessment & Plan: Visit Diagnoses:  1. Coccyx pain     Plan: Impression is sacral pain primarily with no evidence of pilonidal cyst on exam and normal radiographs.  No nerve root tension signs.  I think we should wait this out for another month.  If she has continued symptoms or symptoms that are worsening then we can consider scanning with MRI scan to look for insufficiency stress reaction in the sacral region.  Follow-up at that time for persistent symptoms.  No red flag symptoms today.  Follow-Up Instructions: Return if symptoms worsen or fail to improve.   Orders:  Orders Placed This Encounter  Procedures  . XR Sacrum/Coccyx   No orders of the defined types were placed in this encounter.     Procedures: No procedures performed   Clinical Data: No additional findings.  Objective: Vital Signs: There were no vitals taken for this  visit.  Physical Exam:   Constitutional: Patient appears well-developed HEENT:  Head: Normocephalic Eyes:EOM are normal Neck: Normal range of motion Cardiovascular: Normal rate Pulmonary/chest: Effort normal Neurologic: Patient is alert Skin: Skin is warm Psychiatric: Patient has normal mood and affect    Ortho Exam: Ortho exam demonstrates normal gait alignment.  Palpable pedal pulses.  No nerve root tension signs.  No groin pain with internal X rotation of either leg.  No other masses lymphadenopathy or skin changes noted in that pelvic region.  Specifically no discrete pilonidal cystic structures noted in that gluteal fold region.  No discrete tenderness to palpation around the coccygeal region.  Specialty Comments:  No specialty comments available.  Imaging: No results found.   PMFS History: Patient Active Problem List   Diagnosis Date Noted  . High risk medications (not anticoagulants) long-term use 02/27/2019  . Arthritis 11/15/2018  . Osteoporosis 11/15/2018  . Family history of stroke- daughter age 84 09/22/2018  . History of nephrolithiasis 07/22/2018  . Statin declined 03/16/2018  . White coat syndrome with hypertension 03/15/2017  . Vitamin D deficiency 11/10/2016  . Stressful job 08/12/2016  . Tiredness 08/12/2016  . Nonintractable headache 08/12/2016  . Other acute fatigue 08/12/2016  . Benign positional vertigo, bilateral 06/10/2016  .  Environmental and seasonal allergies 06/10/2016  . Upper back strain 05/07/2016  . Cough 03/06/2016  . Prediabetes 09/24/2015  . Overweight (BMI 25.0-29.9) 09/24/2015  . Elevated HDL = 90 09/24/2015  . Fatigue 09/24/2015  . Osteopenia by Dexa (8-9 yrs ago)  08/20/2015  . Hyperlipidemia 08/20/2015  . Hypertensive disorder 07/24/2014  . GERD (gastroesophageal reflux disease) 11/27/2013  . Chest pain 04/25/2013  . PSVT (paroxysmal supraventricular tachycardia) ([place]) 03/27/2013   Past Medical History:  Diagnosis Date   . Anxiety   . Atypical chest pain   . GERD (gastroesophageal reflux disease)   . Heart palpitations   . Hypertension   . Kidney stones   . PSVT (paroxysmal supraventricular tachycardia) ([place])     Family History  Adopted: Yes    Past Surgical History:  Procedure Laterality Date  . CARDIOVASCULAR STRESS TEST  06/19/2009   Post stress myocardial perfusion images show a normal pattern of perfusion in all regions. ECG positive for ischemia. Appears to be a "false-positive" ECG stress test.  . TUBAL LIGATION     Social History   Occupational History  . Not on file  Tobacco Use  . Smoking status: Former Smoker    Quit date: 01/27/1992    Years since quitting: 27.3  . Smokeless tobacco: Never Used  Substance and Sexual Activity  . Alcohol use: No  . Drug use: No  . Sexual activity: Never    Birth control/protection: None

## 2019-06-13 ENCOUNTER — Telehealth: Payer: Self-pay

## 2019-06-13 NOTE — Telephone Encounter (Signed)
Ok to discontinue Crestor. Her last lipid panel was within normal limits but she was on Crestor, so recommend to be diligent with watching her diet and reducing cholesterol/sat and trans fat (fried foods, red meat, dairy, butter, etc) and stay as active as possible. I don't see a follow-up appointment scheduled yet, so recommend to schedule 3 month f/up visit and will recheck lipid panel too.   Thank you, Herb Grays

## 2019-06-13 NOTE — Telephone Encounter (Signed)
Pt called stating that she is currently on Crestor 5mg  on M, W, F only d/t myalgias with daily dosing. The pt states that this reduced dosage is still causing significant myalgias, wishes to stop this medication and requests advise.  Please advise.  Charyl Bigger, CMA

## 2019-06-13 NOTE — Telephone Encounter (Signed)
Left message for patient to call back. AS, CMA 

## 2019-06-14 NOTE — Telephone Encounter (Signed)
Patient called back while office was at lunch. I have attempted to call back. Unable to reach. Left voicemail. AS, CMA

## 2019-06-14 NOTE — Telephone Encounter (Signed)
Left message for patient to call back. AS, CMA 

## 2019-06-14 NOTE — Telephone Encounter (Signed)
Patient is aware of the below and verbalized understanding. Call transferred to front desk for scheduling. AS, CMA

## 2019-07-05 ENCOUNTER — Encounter: Payer: Self-pay | Admitting: Physician Assistant

## 2019-07-05 ENCOUNTER — Other Ambulatory Visit: Payer: Self-pay

## 2019-07-05 ENCOUNTER — Ambulatory Visit (INDEPENDENT_AMBULATORY_CARE_PROVIDER_SITE_OTHER): Payer: Medicare Other | Admitting: Physician Assistant

## 2019-07-05 VITALS — BP 158/94 | HR 76 | Temp 97.4°F | Ht 61.5 in | Wt 148.7 lb

## 2019-07-05 DIAGNOSIS — L309 Dermatitis, unspecified: Secondary | ICD-10-CM

## 2019-07-05 MED ORDER — TRIAMCINOLONE ACETONIDE 0.1 % EX CREA: 1.0000 "application " | TOPICAL_CREAM | Freq: Every day | CUTANEOUS | 3 refills | Status: DC | PRN

## 2019-07-05 NOTE — Progress Notes (Signed)
Acute Office Visit  Subjective:    Patient ID: Brittany Harvey, female    DOB: 1945-02-28, 74 y.o.   MRN: [id number]  Chief Complaint  Patient presents with  . Insect Bite    HPI Patient is in today for concerns of insect bites. She has several skin lesions that appeared last week. States she just woke up with them. Lesions are itchy. She has tried baby shampoo and cortisone which have helped. Denies new detergents, injury, pain, fever, or chills.   Past Medical History:  Diagnosis Date  . Anxiety   . Atypical chest pain   . GERD (gastroesophageal reflux disease)   . Heart palpitations   . Hypertension   . Kidney stones   . PSVT (paroxysmal supraventricular tachycardia) ([place])     Past Surgical History:  Procedure Laterality Date  . CARDIOVASCULAR STRESS TEST  06/19/2009   Post stress myocardial perfusion images show a normal pattern of perfusion in all regions. ECG positive for ischemia. Appears to be a "false-positive" ECG stress test.  . TUBAL LIGATION      Family History  Adopted: Yes    Social History   Socioeconomic History  . Marital status: Married    Spouse name: Not on file  . Number of children: Not on file  . Years of education: Not on file  . Highest education level: Not on file  Occupational History  . Not on file  Tobacco Use  . Smoking status: Former Smoker    Quit date: 01/27/1992    Years since quitting: 27.4  . Smokeless tobacco: Never Used  Vaping Use  . Vaping Use: Never used  Substance and Sexual Activity  . Alcohol use: No  . Drug use: No  . Sexual activity: Never    Birth control/protection: None  Other Topics Concern  . Not on file  Social History Narrative  . Not on file   Social Determinants of Health   Financial Resource Strain:   . Difficulty of Paying Living Expenses:   Food Insecurity:   . Worried About Charity fundraiser in the Last Year:   . Arboriculturist in the Last Year:   Transportation Needs:   . Lexicographer (Medical):   [name] Lack of Transportation (Non-Medical):   Physical Activity:   . Days of Exercise per Week:   . Minutes of Exercise per Session:   Stress:   . Feeling of Stress :   Social Connections:   . Frequency of Communication with Friends and Family:   . Frequency of Social Gatherings with Friends and Family:   . Attends Religious Services:   . Active Member of Clubs or Organizations:   . Attends Archivist Meetings:   [name] Marital Status:   Intimate Partner Violence:   . Fear of Current or Ex-Partner:   . Emotionally Abused:   [name] Physically Abused:   . Sexually Abused:     Outpatient Medications Prior to Visit  Medication Sig Dispense Refill  . acetaminophen (TYLENOL) 500 MG tablet Take 500 mg by mouth daily as needed (pain).    . Apple Cider Vinegar 500 MG TABS apple cider vinegar    . aspirin EC 81 MG tablet Take 81 mg by mouth daily.    . Calcium Citrate-Vitamin D (CALCIUM CITRATE + D PO) Take 1 tablet by mouth daily.    . Cholecalciferol (VITAMIN D3) 1000 units CAPS Take 5 capsules by mouth daily. Told pt to  increase to 5,000 IU per day    . Cranberry 1000 MG CAPS Take 1 capsule by mouth daily.    . fluticasone (FLONASE) 50 MCG/ACT nasal spray Place 1 spray into both nostrils 2 (two) times daily. 16 g 6  . ibuprofen (ADVIL,MOTRIN) 600 MG tablet Take 1 tablet (600 mg total) by mouth every 8 (eight) hours as needed. 30 tablet 0  . metoprolol succinate (TOPROL-XL) 25 MG 24 hr tablet TAKE 1 AND 1/2 TABLETS EVERY DAY 135 tablet 1  . Multiple Vitamin (MULTIVITAMIN WITH MINERALS) TABS tablet Take 1 tablet by mouth daily. Centrum Silver    . triamcinolone cream (KENALOG) 0.1 % Apply 1 application topically daily as needed (itching/eczema).   3  . rosuvastatin (CRESTOR) 10 MG tablet 1/2 tab q hs on weekdays (Patient not taking: Reported on 07/05/2019) 90 tablet 3   No facility-administered medications prior to visit.    Allergies  Allergen Reactions  .  Clindamycin/Lincomycin Nausea Only  . Penicillins Anaphylaxis and Swelling  . Tussin Dm [Guaifenesin-Dm] Other (See Comments)    Dizziness/ Lightheadness  . Zithromax [Azithromycin] Other (See Comments)    Pre-syncope    Review of Systems  A fourteen system review of systems was performed and found to be positive as per HPI.     Objective:    Physical Exam   General: Well nourished, in no apparent distress. Eyes: PERRLA, EOMs, conjunctiva clr Resp: Respiratory effort- normal, ECTA B/L w/o W/R/R  Cardio: RRR w/o MRGs. Abdomen: no gross distention. Lymphatics:  less 2 sec cap RF, No edema present M-sk: Full ROM, good strength, normal gait.  Skin: Several macular-papular skin lesions and few vesicular lesions present in right forearm, left side and left lower leg.  Neuro: Alert, Oriented Psych: Normal affect, Insight and Judgment appropriate.    BP (!) 158/94   Pulse 76   Temp (!) 97.4 F (36.3 C) (Oral)   Ht 5' 1.5" (1.562 m)   Wt 148 lb 11.2 oz (67.4 kg)   SpO2 97%   BMI 27.64 kg/m  Wt Readings from Last 3 Encounters:  07/05/19 148 lb 11.2 oz (67.4 kg)  02/27/19 142 lb 9.6 oz (64.7 kg)  01/12/19 145 lb 9.6 oz (66 kg)    Health Maintenance Due  Topic Date Due  . COVID-19 Vaccine (1) Never done    There are no preventive care reminders to display for this patient.   Lab Results  Component Value Date   TSH 1.400 01/12/2019   Lab Results  Component Value Date   WBC 7.1 01/12/2019   HGB 13.2 01/12/2019   HCT 38.3 01/12/2019   MCV 88 01/12/2019   PLT 277 01/12/2019   Lab Results  Component Value Date   NA 143 01/12/2019   K 4.2 01/12/2019   CO2 23 01/12/2019   GLUCOSE 90 01/12/2019   BUN 9 01/12/2019   CREATININE 0.69 01/12/2019   BILITOT 0.7 01/12/2019   ALKPHOS 80 01/12/2019   AST 21 01/12/2019   ALT 11 05/09/2019   PROT 7.2 01/12/2019   ALBUMIN 4.6 01/12/2019   CALCIUM 9.4 01/12/2019   ANIONGAP 12 09/09/2014   Lab Results  Component Value  Date   CHOL 169 05/09/2019   Lab Results  Component Value Date   HDL 85 05/09/2019   Lab Results  Component Value Date   LDLCALC 69 05/09/2019   Lab Results  Component Value Date   TRIG 79 05/09/2019   Lab Results  Component Value Date  CHOLHDL 2.0 05/09/2019   Lab Results  Component Value Date   HGBA1C 5.6 01/12/2019       Assessment & Plan:   Problem List Items Addressed This Visit    None    Visit Diagnoses    Dermatitis    -  Primary   Relevant Medications   triamcinolone cream (KENALOG) 0.1 %      Dermatitis: - Rash appearance is suggestive of dermatitis, most likely contact yet unable to clearly identify cause. Will prescribe triamcinolone cream to use as needed for itching, and advised not to apply on face if any new lesions were to develop. - Use emollients or moisturizers frequently - Follow up if rash worsens or fails to improve.    Meds ordered this encounter  Medications  . triamcinolone cream (KENALOG) 0.1 %    Sig: Apply 1 application topically daily as needed (itching/eczema).    Dispense:  30 g    Refill:  3    Order Specific Question:   Supervising Provider    Answer:   Beatrice Lecher D [[id number]]     Lorrene Reid, PA-C

## 2019-08-31 ENCOUNTER — Other Ambulatory Visit: Payer: Self-pay

## 2019-08-31 ENCOUNTER — Ambulatory Visit (INDEPENDENT_AMBULATORY_CARE_PROVIDER_SITE_OTHER): Payer: Medicare Other | Admitting: Physician Assistant

## 2019-08-31 VITALS — Ht 61.5 in | Wt 148.0 lb

## 2019-08-31 DIAGNOSIS — R300 Dysuria: Secondary | ICD-10-CM

## 2019-08-31 LAB — POCT URINALYSIS DIPSTICK
Bilirubin, UA: NEGATIVE
Glucose, UA: NEGATIVE
Ketones, UA: NEGATIVE
Leukocytes, UA: NEGATIVE
Nitrite, UA: NEGATIVE
Protein, UA: NEGATIVE
Spec Grav, UA: 1.025 (ref 1.010–1.025)
Urobilinogen, UA: 0.2 E.U./dL
pH, UA: 7 (ref 5.0–8.0)

## 2019-08-31 NOTE — Progress Notes (Signed)
Patient complaining of dysuria and came in for UA dipstick, which was negative for nitrites, leukocytes, hematuria. Will send for urine culture to rule out cystitis.  Lorrene Reid, PA-C

## 2019-09-02 LAB — URINE CULTURE

## 2019-09-04 ENCOUNTER — Other Ambulatory Visit: Payer: Self-pay

## 2019-09-04 ENCOUNTER — Telehealth: Payer: Self-pay | Admitting: Orthopedic Surgery

## 2019-09-04 DIAGNOSIS — M533 Sacrococcygeal disorders, not elsewhere classified: Secondary | ICD-10-CM

## 2019-09-04 NOTE — Telephone Encounter (Signed)
Pt is aware that Dr. Marlou Sa wants to order the MRI of her sacrum. Please make sure that I ordered correctly.

## 2019-09-04 NOTE — Telephone Encounter (Signed)
Please advise. Your last OV note mentions possibly MRI scan but does not specify scan you would like ordered. Please advise. Thanks.

## 2019-09-04 NOTE — Telephone Encounter (Signed)
Okay for MRI sacrum evaluate for stress fracture/reaction versus pilonidal cyst.  Thanks

## 2019-09-04 NOTE — Telephone Encounter (Signed)
Pt called stating her last appt was 06/08/19 and the pain has insisted; pt states Dr.Dean had mentioned something about getting a scan done, pt would like to proceed  [phone]

## 2019-09-19 ENCOUNTER — Ambulatory Visit: Payer: Medicare Other | Admitting: Physician Assistant

## 2019-09-30 ENCOUNTER — Ambulatory Visit
Admission: RE | Admit: 2019-09-30 | Discharge: 2019-09-30 | Disposition: A | Discharge status: Home / Self Care | Payer: Medicare Other | Source: Ambulatory Visit | Attending: Orthopedic Surgery | Admitting: Orthopedic Surgery

## 2019-09-30 DIAGNOSIS — M533 Sacrococcygeal disorders, not elsewhere classified: Secondary | ICD-10-CM

## 2019-09-30 DIAGNOSIS — M4319 Spondylolisthesis, multiple sites in spine: Secondary | ICD-10-CM | POA: Diagnosis not present

## 2019-09-30 DIAGNOSIS — M7138 Other bursal cyst, other site: Secondary | ICD-10-CM | POA: Diagnosis not present

## 2019-09-30 DIAGNOSIS — N281 Cyst of kidney, acquired: Secondary | ICD-10-CM | POA: Diagnosis not present

## 2019-09-30 DIAGNOSIS — M47817 Spondylosis without myelopathy or radiculopathy, lumbosacral region: Secondary | ICD-10-CM | POA: Diagnosis not present

## 2019-10-04 ENCOUNTER — Ambulatory Visit (INDEPENDENT_AMBULATORY_CARE_PROVIDER_SITE_OTHER): Payer: Medicare Other | Admitting: Orthopedic Surgery

## 2019-10-04 DIAGNOSIS — M533 Sacrococcygeal disorders, not elsewhere classified: Secondary | ICD-10-CM | POA: Diagnosis not present

## 2019-10-06 ENCOUNTER — Telehealth: Payer: Self-pay | Admitting: Physician Assistant

## 2019-10-06 NOTE — Telephone Encounter (Signed)
Attempted to call patient. Someone answered but did not speak after saying hello. Attempted to call again, no answer. Left message for patient to call back. AS, CMA

## 2019-10-06 NOTE — Telephone Encounter (Signed)
Patient called and left VM asking to speak with a nurse about a medical issue she is having, she did not specify on the VM the issue in questions, only that she is wanting to speak with a nurse.

## 2019-10-07 ENCOUNTER — Encounter: Payer: Self-pay | Admitting: Orthopedic Surgery

## 2019-10-07 NOTE — Progress Notes (Signed)
Office Visit Note   Patient: Brittany Harvey           Date of Birth: August 04, 1945           MRN: [id number] Visit Date: 10/04/2019 Requested by: Lorrene Reid, PA-C [place],  [place] [address] PCP: Lorrene Reid, PA-C  Subjective: Chief Complaint  Patient presents with  . review scan    HPI: Brittany Harvey is a patient with coccyx and low back pain who presents for follow-up of MRI scan.  Patient states the pain keeps her awake and is worse at night.  Takes Tylenol for her symptoms.  She still is working.  Reports some pain radiating down her left side and into the knee.  MRI scan is reviewed and she has no real coccyx or sacral issue but does have severe L5-S1 facet arthritis.              ROS: All systems reviewed are negative as they relate to the chief complaint within the history of present illness.  Patient denies  fevers or chills.   Assessment & Plan: Visit Diagnoses:  1. Coccyx pain     Plan: Impression is referred pain from the facet joints into the coccyx region.  Takes Tylenol for symptoms.  We talked about exercise type program today.  I do think epidural steroid injection and possible facet injection and later ablation may be beneficial for her.  She is going to call back to get that scheduled if her symptoms worsen.  Follow-Up Instructions: Return if symptoms worsen or fail to improve.   Orders:  No orders of the defined types were placed in this encounter.  No orders of the defined types were placed in this encounter.     Procedures: No procedures performed   Clinical Data: No additional findings.  Objective: Vital Signs: There were no vitals taken for this visit.  Physical Exam:   Constitutional: Patient appears well-developed HEENT:  Head: Normocephalic Eyes:EOM are normal Neck: Normal range of motion Cardiovascular: Normal rate Pulmonary/chest: Effort normal Neurologic: Patient is alert Skin: Skin is warm Psychiatric:  Patient has normal mood and affect    Ortho Exam: Ortho exam demonstrates full active and passive range of motion of both knees ankles and hips.  Some pain with forward lateral bending.  No discrete SI joint tenderness today but she does have some pain in that lower lumbar region.  No paresthesias L1 S1 bilaterally.  Mild pain with forward lateral bending.  No direct trochanteric tenderness.  Specialty Comments:  No specialty comments available.  Imaging: No results found.   PMFS History: Patient Active Problem List   Diagnosis Date Noted  . High risk medications (not anticoagulants) long-term use 02/27/2019  . Arthritis 11/15/2018  . Osteoporosis 11/15/2018  . Family history of stroke- daughter age 73 09/22/2018  . History of nephrolithiasis 07/22/2018  . Statin declined 03/16/2018  . White coat syndrome with hypertension 03/15/2017  . Vitamin D deficiency 11/10/2016  . Stressful job 08/12/2016  . Tiredness 08/12/2016  . Nonintractable headache 08/12/2016  . Other acute fatigue 08/12/2016  . Benign positional vertigo, bilateral 06/10/2016  . Environmental and seasonal allergies 06/10/2016  . Upper back strain 05/07/2016  . Cough 03/06/2016  . Prediabetes 09/24/2015  . Overweight (BMI 25.0-29.9) 09/24/2015  . Elevated HDL = 90 09/24/2015  . Fatigue 09/24/2015  . Osteopenia by Dexa (8-9 yrs ago)  08/20/2015  . Hyperlipidemia 08/20/2015  . Hypertensive disorder 07/24/2014  .  GERD (gastroesophageal reflux disease) 11/27/2013  . Chest pain 04/25/2013  . PSVT (paroxysmal supraventricular tachycardia) ([place]) 03/27/2013   Past Medical History:  Diagnosis Date  . Anxiety   . Atypical chest pain   . GERD (gastroesophageal reflux disease)   . Heart palpitations   . Hypertension   . Kidney stones   . PSVT (paroxysmal supraventricular tachycardia) ([place])     Family History  Adopted: Yes    Past Surgical History:  Procedure Laterality Date  . CARDIOVASCULAR STRESS TEST   06/19/2009   Post stress myocardial perfusion images show a normal pattern of perfusion in all regions. ECG positive for ischemia. Appears to be a "false-positive" ECG stress test.  . TUBAL LIGATION     Social History   Occupational History  . Not on file  Tobacco Use  . Smoking status: Former Smoker    Quit date: 01/27/1992    Years since quitting: 27.7  . Smokeless tobacco: Never Used  Vaping Use  . Vaping Use: Never used  Substance and Sexual Activity  . Alcohol use: No  . Drug use: No  . Sexual activity: Never    Birth control/protection: None

## 2019-10-09 NOTE — Telephone Encounter (Signed)
Left message for patient to call back. AS, CMA 

## 2019-10-10 ENCOUNTER — Other Ambulatory Visit: Payer: Self-pay

## 2019-10-10 ENCOUNTER — Encounter: Payer: Self-pay | Admitting: Physician Assistant

## 2019-10-10 ENCOUNTER — Ambulatory Visit (INDEPENDENT_AMBULATORY_CARE_PROVIDER_SITE_OTHER): Payer: Medicare Other | Admitting: Physician Assistant

## 2019-10-10 VITALS — BP 112/62 | HR 60 | Temp 96.8°F | Ht 61.5 in | Wt 142.0 lb

## 2019-10-10 DIAGNOSIS — J3089 Other allergic rhinitis: Secondary | ICD-10-CM | POA: Diagnosis not present

## 2019-10-10 DIAGNOSIS — I471 Supraventricular tachycardia: Secondary | ICD-10-CM

## 2019-10-10 DIAGNOSIS — G720 Drug-induced myopathy: Secondary | ICD-10-CM

## 2019-10-10 DIAGNOSIS — E785 Hyperlipidemia, unspecified: Secondary | ICD-10-CM

## 2019-10-10 DIAGNOSIS — R42 Dizziness and giddiness: Secondary | ICD-10-CM

## 2019-10-10 DIAGNOSIS — I10 Essential (primary) hypertension: Secondary | ICD-10-CM | POA: Diagnosis not present

## 2019-10-10 NOTE — Progress Notes (Signed)
Telehealth office visit note for Brittany Reid, PA-C- at Primary Care at [facility]   I connected with current patient today by telephone and verified that I am speaking with the correct person   . Location of the patient: Home . Location of the provider: Office - This visit type was conducted due to national recommendations for restrictions regarding the COVID-19 Pandemic (e.g. social distancing) in an effort to limit this patient's exposure and mitigate transmission in our community.    - No physical exam could be performed with this format, beyond that communicated to [place] by the patient/ family members as noted.   - Additionally my office staff/ schedulers were to discuss with the patient that there may be a monetary charge related to this service, depending on their medical insurance.  My understanding is that patient understood and consented to proceed.     _________________________________________________________________________________   History of Present Illness: Pt calls in to follow up on follow-up on hypertension and hyperlipidemia.[name]  HTN: Pt denies new onset chest pain, palpitations, dizziness or leg swelling. Taking medication as directed without side effects. Checks BP at home  and readings range in 110s/60s. Pt follows a low salt diet.  Patient does report an episode of severe dizziness this past weekend.  She does have a history of vertigo. She contacted EMS and states everything looked good except for her blood pressure being up at 168/90, which usually happens due to whitecoat syndrome.  Reports she is feeling much better. Reports good hydration.  HLD: Pt intolerant to statins due to side effects- severe myalgias. Reports that she has made some dietary changes and has reduced grilled cheese and cheese crackers.   Seasonal allergies: States pollen and possibly trigger her allergies and takes an over-the-counter antihistamine allergy medicine and uses Flonase to help  with her symptoms when needed.  PVST: Followed by cardiology.  Reports compliance with metoprolol.  She does monitor her pulse at home and if pulse is low she does not take half tablet beta-blocker.    GAD 7 : Generalized Anxiety Score 08/12/2016  Nervous, Anxious, on Edge 1  Control/stop worrying 1  Worry too much - different things 1  Trouble relaxing 0  Restless 0  Easily annoyed or irritable 0  Afraid - awful might happen 0  Total GAD 7 Score 3  Anxiety Difficulty Not difficult at all    Depression screen [facility] 2/9 10/10/2019 07/05/2019 01/12/2019 09/22/2018 06/28/2018  Decreased Interest 0 0 0 0 0  Down, Depressed, Hopeless 0 0 0 0 0  PHQ - 2 Score 0 0 0 0 0  Altered sleeping 0 0 0 0 0  Tired, decreased energy 0 0 0 0 0  Change in appetite 0 0 0 0 0  Feeling bad or failure about yourself  0 0 0 0 0  Trouble concentrating 0 0 0 0 0  Moving slowly or fidgety/restless 0 0 0 0 0  Suicidal thoughts 0 0 0 0 0  PHQ-9 Score 0 0 0 0 0  Difficult doing work/chores - - - Not difficult at all Not difficult at all  Some recent data might be hidden      Impression and Recommendations:     1. Essential hypertension   2. Hyperlipidemia, unspecified hyperlipidemia type   3. Environmental and seasonal allergies   4. White coat syndrome with hypertension   5. PSVT (paroxysmal supraventricular tachycardia) (HCC)     Essential hypertension, White coat  syndrome with hypertension: -BP at goal -Continue current medication regimen. -Continue ambulatory BP and pulse monitoring. -Continue low-sodium diet.  Hyperlipidemia, Statin myopathy: -Last lipid panel WNL, LDL 69 -Recommend to continue with dietary changes and follow a heart healthy diet. -Will continue to monitor and plan to recheck lipid panel with [facility].  Environmental and seasonal allergies: -Stable -Continue current medication regimen as needed.  PSVT: -Followed by cardiology. -Asymptomatic -Continue current medication regimen  and ambulatory pulse monitoring.  Dizziness:  -Resolved -Recommend to continue with good hydration.  - As part of my medical decision making, I reviewed the following data within the [place] History obtained from pt /family, CMA notes reviewed and incorporated if applicable, Labs reviewed, Radiograph/ tests reviewed if applicable and OV notes from prior OV's with me, as well as any other specialists she/he has seen since seeing me last, were all reviewed and used in my medical decision making process today.    - Additionally, when appropriate, discussion had with patient regarding our treatment plan, and their biases/concerns about that plan were used in my medical decision making today.    - The patient agreed with the plan and demonstrated an understanding of the instructions.   No barriers to understanding were identified.     - The patient was advised to call back or seek an in-person evaluation if the symptoms worsen or if the condition fails to improve as anticipated.   No follow-ups on file.    No orders of the defined types were placed in this encounter.   No orders of the defined types were placed in this encounter.   There are no discontinued medications.     Time spent on visit including pre-visit chart review and post-visit care was 15 minutes.      The [place] was signed into law in 2016 which includes the topic of electronic health records.  This provides immediate access to information in MyChart.  This includes consultation notes, operative notes, office notes, lab results and pathology reports.  If you have any questions about what you read please let us know at your next visit or call us at the office.  We are right here with you.  Note:  This note was prepared with assistance of Dragon voice recognition software. Occasional wrong-word or sound-a-like substitutions may have occurred due to the inherent limitations of voice  recognition software.  __________________________________________________________________________________     Patient Care Team    Relationship Specialty Notifications Start End  Brittany Harvey, [place] PCP - General   05/28/19   Lorretta Harp, MD PCP - Cardiology Cardiology  11/03/17   Juanita Craver, MD Consulting Physician Gastroenterology  09/24/15   Lorretta Harp, MD Consulting Physician Cardiology  09/24/15   Everlene Farrier, MD Consulting Physician Obstetrics and Gynecology  09/24/15    Comment: Juanda Chance, NP     -Vitals obtained; medications/ allergies reconciled;  personal medical, social, Sx etc.histories were updated by CMA, reviewed by me and are reflected in chart   Patient Active Problem List   Diagnosis Date Noted  . High risk medications (not anticoagulants) long-term use 02/27/2019  . Arthritis 11/15/2018  . Osteoporosis 11/15/2018  . Family history of stroke- daughter age 55 09/22/2018  . History of nephrolithiasis 07/22/2018  . Statin declined 03/16/2018  . White coat syndrome with hypertension 03/15/2017  . Vitamin D deficiency 11/10/2016  . Stressful job 08/12/2016  . Tiredness 08/12/2016  . Nonintractable headache 08/12/2016  .  Other acute fatigue 08/12/2016  . Benign positional vertigo, bilateral 06/10/2016  . Environmental and seasonal allergies 06/10/2016  . Upper back strain 05/07/2016  . Cough 03/06/2016  . Prediabetes 09/24/2015  . Overweight (BMI 25.0-29.9) 09/24/2015  . Elevated HDL = 90 09/24/2015  . Fatigue 09/24/2015  . Osteopenia by Dexa (8-9 yrs ago)  08/20/2015  . Hyperlipidemia 08/20/2015  . Hypertensive disorder 07/24/2014  . GERD (gastroesophageal reflux disease) 11/27/2013  . Chest pain 04/25/2013  . PSVT (paroxysmal supraventricular tachycardia) (HCC) 03/27/2013     Current Meds  Medication Sig  . acetaminophen (TYLENOL) 500 MG tablet Take 500 mg by mouth daily as needed (pain).  . Apple Cider Vinegar 500 MG TABS apple  cider vinegar  . aspirin EC 81 MG tablet Take 81 mg by mouth daily.  . Calcium Citrate-Vitamin D (CALCIUM CITRATE + D PO) Take 1 tablet by mouth daily.  . Cholecalciferol (VITAMIN D3) 1000 units CAPS Take 5 capsules by mouth daily. Told pt to increase to 5,000 IU per day  . Cranberry 1000 MG CAPS Take 1 capsule by mouth daily.  [name] ibuprofen (ADVIL,MOTRIN) 600 MG tablet Take 1 tablet (600 mg total) by mouth every 8 (eight) hours as needed.  . metoprolol succinate (TOPROL-XL) 25 MG 24 hr tablet TAKE 1 AND 1/2 TABLETS EVERY DAY  . Multiple Vitamin (MULTIVITAMIN WITH MINERALS) TABS tablet Take 1 tablet by mouth daily. Centrum Silver  . triamcinolone cream (KENALOG) 0.1 % Apply 1 application topically daily as needed (itching/eczema).     Allergies:  Allergies  Allergen Reactions  . Clindamycin/Lincomycin Nausea Only  . Penicillins Anaphylaxis and Swelling  . Tussin Dm [Guaifenesin-Dm] Other (See Comments)    Dizziness/ Lightheadness  . Zithromax [Azithromycin] Other (See Comments)    Pre-syncope     ROS:  See above HPI for pertinent positives and negatives   Objective:   Blood pressure 112/62, pulse 60, temperature (!) 96.8 F (36 C), height 5' 1.5" (1.562 m), weight 142 lb (64.4 kg), SpO2 97 %.  (if some vitals are omitted, this means that patient was UNABLE to obtain them even though they were asked to get them prior to OV today.  They were asked to call us at their earliest convenience with these once obtained. ) General: A & O * 3; sounds in no acute distress; in usual state of health.  Respiratory: speaking in full sentences, no conversational dyspnea; Psych: insight appears good, mood- appears full

## 2019-11-02 ENCOUNTER — Other Ambulatory Visit: Payer: Self-pay

## 2019-11-02 ENCOUNTER — Ambulatory Visit (INDEPENDENT_AMBULATORY_CARE_PROVIDER_SITE_OTHER): Payer: Medicare Other

## 2019-11-02 ENCOUNTER — Other Ambulatory Visit: Payer: Self-pay | Admitting: Cardiovascular Disease

## 2019-11-02 VITALS — BP 153/70 | HR 71 | Ht 61.0 in | Wt 148.3 lb

## 2019-11-02 DIAGNOSIS — Z23 Encounter for immunization: Secondary | ICD-10-CM

## 2019-11-02 NOTE — Progress Notes (Signed)
Patient tolerated vaccine well. VIS given. AS, CMA 

## 2019-11-14 ENCOUNTER — Telehealth: Payer: Self-pay | Admitting: Orthopedic Surgery

## 2019-11-14 NOTE — Telephone Encounter (Signed)
Patient called asked if there is another alternative for her instead of getting the injection from Dr. Ernestina Patches? Patient said all of her family lives out of town and there's no one to bring her. Patient said if prescribed any medicine it would have to be a very low dosage. The pharmacy patient uses is [facility].  The number to contact patient is [phone]

## 2019-11-14 NOTE — Telephone Encounter (Signed)
Pls advise.  

## 2019-11-14 NOTE — Telephone Encounter (Signed)
I think she could try a course of oral anti-inflammatories vs the ESI.  Don't think that any narcotic pain medication is indicated.  I can prescribe something like a month of Meloxicam if she can tolerate NSAIDs and see if that helps?

## 2019-11-15 ENCOUNTER — Telehealth: Payer: Self-pay

## 2019-11-15 ENCOUNTER — Telehealth: Payer: Self-pay | Admitting: Orthopedic Surgery

## 2019-11-15 NOTE — Telephone Encounter (Signed)
Tried calling. No answer. LMVM with details advising per below. Advised her to call us and let us know how she wanted to proceed.

## 2019-11-15 NOTE — Telephone Encounter (Signed)
Patient called to leave cell phone number for call back  [phone]    See previous message

## 2019-11-15 NOTE — Telephone Encounter (Signed)
Patient called she is referring back to last message she wants to try a low dose of meloxicam patient would like a call back. Call back:[phone]

## 2019-11-16 ENCOUNTER — Other Ambulatory Visit: Payer: Self-pay | Admitting: Surgical

## 2019-11-16 MED ORDER — MELOXICAM 7.5 MG PO TABS
7.5000 mg | ORAL_TABLET | Freq: Every day | ORAL | 1 refills | Status: DC
Start: 2019-11-16 — End: 2020-01-25

## 2019-11-16 NOTE — Telephone Encounter (Signed)
Noted  

## 2019-11-16 NOTE — Telephone Encounter (Signed)
Sent in to [facility]

## 2019-11-16 NOTE — Telephone Encounter (Signed)
See below

## 2019-11-21 DIAGNOSIS — N958 Other specified menopausal and perimenopausal disorders: Secondary | ICD-10-CM | POA: Diagnosis not present

## 2019-11-21 DIAGNOSIS — Z1231 Encounter for screening mammogram for malignant neoplasm of breast: Secondary | ICD-10-CM | POA: Diagnosis not present

## 2019-11-21 DIAGNOSIS — Z01419 Encounter for gynecological examination (general) (routine) without abnormal findings: Secondary | ICD-10-CM | POA: Diagnosis not present

## 2019-11-21 DIAGNOSIS — M8588 Other specified disorders of bone density and structure, other site: Secondary | ICD-10-CM | POA: Diagnosis not present

## 2019-11-21 DIAGNOSIS — Z6827 Body mass index (BMI) 27.0-27.9, adult: Secondary | ICD-10-CM | POA: Diagnosis not present

## 2019-12-05 ENCOUNTER — Other Ambulatory Visit: Payer: Self-pay | Admitting: Cardiovascular Disease

## 2019-12-27 ENCOUNTER — Other Ambulatory Visit: Payer: Self-pay | Admitting: Physician Assistant

## 2019-12-27 ENCOUNTER — Other Ambulatory Visit: Payer: Self-pay | Admitting: Cardiovascular Disease

## 2019-12-27 DIAGNOSIS — E559 Vitamin D deficiency, unspecified: Secondary | ICD-10-CM

## 2019-12-27 DIAGNOSIS — E7889 Other lipoprotein metabolism disorders: Secondary | ICD-10-CM

## 2019-12-27 DIAGNOSIS — E785 Hyperlipidemia, unspecified: Secondary | ICD-10-CM

## 2019-12-27 DIAGNOSIS — R7303 Prediabetes: Secondary | ICD-10-CM

## 2019-12-27 DIAGNOSIS — Z0000 Encounter for general adult medical examination without abnormal findings: Secondary | ICD-10-CM

## 2019-12-27 DIAGNOSIS — I10 Essential (primary) hypertension: Secondary | ICD-10-CM

## 2019-12-29 ENCOUNTER — Other Ambulatory Visit: Payer: Medicare Other

## 2019-12-29 ENCOUNTER — Other Ambulatory Visit: Payer: Self-pay

## 2019-12-29 ENCOUNTER — Encounter: Payer: Self-pay | Admitting: Physician Assistant

## 2019-12-29 ENCOUNTER — Ambulatory Visit (INDEPENDENT_AMBULATORY_CARE_PROVIDER_SITE_OTHER): Payer: Medicare Other | Admitting: Physician Assistant

## 2019-12-29 VITALS — BP 137/91 | HR 70 | Ht 61.5 in | Wt 142.0 lb

## 2019-12-29 DIAGNOSIS — E559 Vitamin D deficiency, unspecified: Secondary | ICD-10-CM

## 2019-12-29 DIAGNOSIS — R7303 Prediabetes: Secondary | ICD-10-CM | POA: Diagnosis not present

## 2019-12-29 DIAGNOSIS — Z0000 Encounter for general adult medical examination without abnormal findings: Secondary | ICD-10-CM | POA: Diagnosis not present

## 2019-12-29 DIAGNOSIS — E7889 Other lipoprotein metabolism disorders: Secondary | ICD-10-CM | POA: Diagnosis not present

## 2019-12-29 DIAGNOSIS — E785 Hyperlipidemia, unspecified: Secondary | ICD-10-CM

## 2019-12-29 DIAGNOSIS — I10 Essential (primary) hypertension: Secondary | ICD-10-CM

## 2019-12-29 DIAGNOSIS — M545 Low back pain, unspecified: Secondary | ICD-10-CM | POA: Diagnosis not present

## 2019-12-29 MED ORDER — KETOROLAC TROMETHAMINE 60 MG/2ML IM SOLN
60.0000 mg | Freq: Once | INTRAMUSCULAR | Status: AC
  Administered 2019-12-29: 60 mg via INTRAMUSCULAR

## 2019-12-29 MED ORDER — MELOXICAM 15 MG PO TABS: 15.0000 mg | ORAL_TABLET | Freq: Every day | ORAL | 0 refills | Status: DC

## 2019-12-29 NOTE — Progress Notes (Signed)
Acute Office Visit  Subjective:    Patient ID: Brittany Harvey, female    DOB: 11/08/1945, 74 y.o.   MRN: [id number]  Chief Complaint  Patient presents with  . Back Pain    HPI Patient is in today for back pain. Patient is experiencing an exacerbation of low back pain from coccyx that radiates down to left leg. Patient is followed by Dr. Marlou Sa (Orthopedics) and was recommended to have epidural injection but patient requested alternative treatment option because family lives out of town and will be challenging to have someone accompany her for the procedure. Dr. Marlou Sa prescribed Meloxicam 7.5 mg. Medication has not been helping with current pain. Patient has also applied heat/ice with minimal relief.  Past Medical History:  Diagnosis Date  . Anxiety   . Atypical chest pain   . GERD (gastroesophageal reflux disease)   . Heart palpitations   . Hypertension   . Kidney stones   . PSVT (paroxysmal supraventricular tachycardia) ([facility])     Past Surgical History:  Procedure Laterality Date  . CARDIOVASCULAR STRESS TEST  06/19/2009   Post stress myocardial perfusion images show a normal pattern of perfusion in all regions. ECG positive for ischemia. Appears to be a "false-positive" ECG stress test.  . TUBAL LIGATION      Family History  Adopted: Yes    Social History   Socioeconomic History  . Marital status: Married    Spouse name: Not on file  . Number of children: Not on file  . Years of education: Not on file  . Highest education level: Not on file  Occupational History  . Not on file  Tobacco Use  . Smoking status: Former Smoker    Quit date: 01/27/1992    Years since quitting: 27.9  . Smokeless tobacco: Never Used  Vaping Use  . Vaping Use: Never used  Substance and Sexual Activity  . Alcohol use: No  . Drug use: No  . Sexual activity: Never    Birth control/protection: None  Other Topics Concern  . Not on file  Social History Narrative  . Not on file   Social  Determinants of Health   Financial Resource Strain:   . Difficulty of Paying Living Expenses: Not on file  Food Insecurity:   . Worried About Charity fundraiser in the Last Year: Not on file  . Ran Out of Food in the Last Year: Not on file  Transportation Needs:   . Lack of Transportation (Medical): Not on file  . Lack of Transportation (Non-Medical): Not on file  Physical Activity:   . Days of Exercise per Week: Not on file  . Minutes of Exercise per Session: Not on file  Stress:   . Feeling of Stress : Not on file  Social Connections:   . Frequency of Communication with Friends and Family: Not on file  . Frequency of Social Gatherings with Friends and Family: Not on file  . Attends Religious Services: Not on file  . Active Member of Clubs or Organizations: Not on file  . Attends Archivist Meetings: Not on file  . Marital Status: Not on file  Intimate Partner Violence:   . Fear of Current or Ex-Partner: Not on file  . Emotionally Abused: Not on file  . Physically Abused: Not on file  . Sexually Abused: Not on file    Outpatient Medications Prior to Visit  Medication Sig Dispense Refill  . acetaminophen (TYLENOL) 500 MG tablet Take  500 mg by mouth daily as needed (pain).    . Apple Cider Vinegar 500 MG TABS apple cider vinegar    . aspirin EC 81 MG tablet Take 81 mg by mouth daily.    . Calcium Citrate-Vitamin D (CALCIUM CITRATE + D PO) Take 1 tablet by mouth daily.    . Cholecalciferol (VITAMIN D3) 1000 units CAPS Take 5 capsules by mouth daily. Told pt to increase to 5,000 IU per day    . Cranberry 1000 MG CAPS Take 1 capsule by mouth daily.    . fluticasone (FLONASE) 50 MCG/ACT nasal spray Place 1 spray into both nostrils 2 (two) times daily. 16 g 6  . ibuprofen (ADVIL,MOTRIN) 600 MG tablet Take 1 tablet (600 mg total) by mouth every 8 (eight) hours as needed. 30 tablet 0  . meloxicam (MOBIC) 7.5 MG tablet Take 1 tablet (7.5 mg total) by mouth daily. 30 tablet 1   . metoprolol succinate (TOPROL-XL) 25 MG 24 hr tablet TAKE 1 AND 1/2 TABLETS EVERY DAY 30 tablet 0  . Multiple Vitamin (MULTIVITAMIN WITH MINERALS) TABS tablet Take 1 tablet by mouth daily. Centrum Silver    . triamcinolone cream (KENALOG) 0.1 % Apply 1 application topically daily as needed (itching/eczema). 30 g 3   No facility-administered medications prior to visit.    Allergies  Allergen Reactions  . Clindamycin/Lincomycin Nausea Only  . Penicillins Anaphylaxis and Swelling  . Tussin Dm [Guaifenesin-Dm] Other (See Comments)    Dizziness/ Lightheadness  . Zithromax [Azithromycin] Other (See Comments)    Pre-syncope    Review of Systems A fourteen system review of systems was performed and found to be positive as per HPI.    Objective:    Physical Exam General: In no acute distress, but does appear uncomfortable sitting related to pain Eyes: PERRLA, EOMs, conjunctiva clr Resp: Respiratory effort- normal. Abdomen: no gross distention. Lymphatics:  No gross edema M-sk: Limited ROM due to pain, TTP of left gluteus and lower leg. Muscle atrophy of left gluteus related to prior injury. Skin: Warm, dry  Neuro: Alert, Oriented Psych: Normal affect, Insight and Judgment appropriate.   BP (!) 137/91   Pulse 70   Ht 5' 1.5" (1.562 m)   Wt 142 lb (64.4 kg)   SpO2 98%   BMI 26.40 kg/m  Wt Readings from Last 3 Encounters:  12/29/19 142 lb (64.4 kg)  11/02/19 148 lb 4.8 oz (67.3 kg)  10/10/19 142 lb (64.4 kg)    Health Maintenance Due  Topic Date Due  . COVID-19 Vaccine (1) Never done    There are no preventive care reminders to display for this patient.   Lab Results  Component Value Date   TSH 1.400 01/12/2019   Lab Results  Component Value Date   WBC 7.1 01/12/2019   HGB 13.2 01/12/2019   HCT 38.3 01/12/2019   MCV 88 01/12/2019   PLT 277 01/12/2019   Lab Results  Component Value Date   NA 143 01/12/2019   K 4.2 01/12/2019   CO2 23 01/12/2019   GLUCOSE  90 01/12/2019   BUN 9 01/12/2019   CREATININE 0.69 01/12/2019   BILITOT 0.7 01/12/2019   ALKPHOS 80 01/12/2019   AST 21 01/12/2019   ALT 11 05/09/2019   PROT 7.2 01/12/2019   ALBUMIN 4.6 01/12/2019   CALCIUM 9.4 01/12/2019   ANIONGAP 12 09/09/2014   Lab Results  Component Value Date   CHOL 169 05/09/2019   Lab Results  Component Value  Date   HDL 85 05/09/2019   Lab Results  Component Value Date   LDLCALC 69 05/09/2019   Lab Results  Component Value Date   TRIG 79 05/09/2019   Lab Results  Component Value Date   CHOLHDL 2.0 05/09/2019   Lab Results  Component Value Date   HGBA1C 5.6 01/12/2019       Assessment & Plan:   Problem List Items Addressed This Visit    None    Visit Diagnoses    Acute left-sided low back pain, unspecified whether sciatica present    -  Primary   Relevant Medications   meloxicam (MOBIC) 15 MG tablet   ketorolac (TORADOL) injection 60 mg (Completed)     Acute left-sided low back pain, unspecified whether sciatica present: -Due to severity of acute back pain will order IM of Ketorolac 60 mg. Last renal function wnl. Advised to not take any NSAIDs today, take Tylenol for pain and continue with heat/ice therapy. Recommend to avoid exacerbating activities. Will provide work note. -Discussed with patient to highly consider epidural injection if symptoms fail to improve or worsen. -Will send rx for Meloxicam 15 mg for severe pain. Advised to not take together with 7.5 mg or with other NSAIDs. -Follow up with Orthopedics.  Meds ordered this encounter  Medications  . meloxicam (MOBIC) 15 MG tablet    Sig: Take 1 tablet (15 mg total) by mouth daily.    Dispense:  30 tablet    Refill:  0    Order Specific Question:   Supervising Provider    Answer:   Beatrice Lecher D [[id number]]  . ketorolac (TORADOL) injection 60 mg     Lorrene Reid, PA-C

## 2019-12-29 NOTE — Patient Instructions (Signed)
Acute Back Pain, Adult Acute back pain is sudden and usually short-lived. It is often caused by an injury to the muscles and tissues in the back. The injury may result from:  A muscle or ligament getting overstretched or torn (strained). Ligaments are tissues that connect bones to each other. Lifting something improperly can cause a back strain.  Wear and tear (degeneration) of the spinal disks. Spinal disks are circular tissue that provides cushioning between the bones of the spine (vertebrae).  Twisting motions, such as while playing sports or doing yard work.  A hit to the back.  Arthritis. You may have a physical exam, lab tests, and imaging tests to find the cause of your pain. Acute back pain usually goes away with rest and home care. Follow these instructions at home: Managing pain, stiffness, and swelling  Take over-the-counter and prescription medicines only as told by your health care provider.  Your health care provider may recommend applying ice during the first 24-48 hours after your pain starts. To do this: ? Put ice in a plastic bag. ? Place a towel between your skin and the bag. ? Leave the ice on for 20 minutes, 2-3 times a day.  If directed, apply heat to the affected area as often as told by your health care provider. Use the heat source that your health care provider recommends, such as a moist heat pack or a heating pad. ? Place a towel between your skin and the heat source. ? Leave the heat on for 20-30 minutes. ? Remove the heat if your skin turns bright red. This is especially important if you are unable to feel pain, heat, or cold. You have a greater risk of getting burned. Activity   Do not stay in bed. Staying in bed for more than 1-2 days can delay your recovery.  Sit up and stand up straight. Avoid leaning forward when you sit, or hunching over when you stand. ? If you work at a desk, sit close to it so you do not need to lean over. Keep your chin tucked  in. Keep your neck drawn back, and keep your elbows bent at a right angle. Your arms should look like the letter "L." ? Sit high and close to the steering wheel when you drive. Add lower back (lumbar) support to your car seat, if needed.  Take short walks on even surfaces as soon as you are able. Try to increase the length of time you walk each day.  Do not sit, drive, or stand in one place for more than 30 minutes at a time. Sitting or standing for long periods of time can put stress on your back.  Do not drive or use heavy machinery while taking prescription pain medicine.  Use proper lifting techniques. When you bend and lift, use positions that put less stress on your back: ? Bend your knees. ? Keep the load close to your body. ? Avoid twisting.  Exercise regularly as told by your health care provider. Exercising helps your back heal faster and helps prevent back injuries by keeping muscles strong and flexible.  Work with a physical therapist to make a safe exercise program, as recommended by your health care provider. Do any exercises as told by your physical therapist. Lifestyle  Maintain a healthy weight. Extra weight puts stress on your back and makes it difficult to have good posture.  Avoid activities or situations that make you feel anxious or stressed. Stress and anxiety increase muscle   tension and can make back pain worse. Learn ways to manage anxiety and stress, such as through exercise. General instructions  Sleep on a firm mattress in a comfortable position. Try lying on your side with your knees slightly bent. If you lie on your back, put a pillow under your knees.  Follow your treatment plan as told by your health care provider. This may include: ? Cognitive or behavioral therapy. ? Acupuncture or massage therapy. ? Meditation or yoga. Contact a health care provider if:  You have pain that is not relieved with rest or medicine.  You have increasing pain going down  into your legs or buttocks.  Your pain does not improve after 2 weeks.  You have pain at night.  You lose weight without trying.  You have a fever or chills. Get help right away if:  You develop new bowel or bladder control problems.  You have unusual weakness or numbness in your arms or legs.  You develop nausea or vomiting.  You develop abdominal pain.  You feel faint. Summary  Acute back pain is sudden and usually short-lived.  Use proper lifting techniques. When you bend and lift, use positions that put less stress on your back.  Take over-the-counter and prescription medicines and apply heat or ice as directed by your health care provider. This information is not intended to replace advice given to you by your health care provider. Make sure you discuss any questions you have with your health care provider. Document Revised: 05/03/2018 Document Reviewed: 08/26/2016 Elsevier Patient Education  2020 [employer]  

## 2019-12-30 ENCOUNTER — Encounter (HOSPITAL_COMMUNITY): Payer: Self-pay | Admitting: Emergency Medicine

## 2019-12-30 ENCOUNTER — Emergency Department (HOSPITAL_COMMUNITY)
Admission: EM | Admit: 2019-12-30 | Discharge: 2019-12-30 | Disposition: A | Discharge status: Home / Self Care | Payer: Medicare Other | Attending: Emergency Medicine | Admitting: Emergency Medicine

## 2019-12-30 ENCOUNTER — Other Ambulatory Visit: Payer: Self-pay

## 2019-12-30 DIAGNOSIS — M5136 Other intervertebral disc degeneration, lumbar region: Secondary | ICD-10-CM

## 2019-12-30 DIAGNOSIS — M5442 Lumbago with sciatica, left side: Secondary | ICD-10-CM | POA: Diagnosis not present

## 2019-12-30 DIAGNOSIS — I10 Essential (primary) hypertension: Secondary | ICD-10-CM | POA: Diagnosis not present

## 2019-12-30 DIAGNOSIS — Z87891 Personal history of nicotine dependence: Secondary | ICD-10-CM | POA: Insufficient documentation

## 2019-12-30 DIAGNOSIS — Z79899 Other long term (current) drug therapy: Secondary | ICD-10-CM | POA: Diagnosis not present

## 2019-12-30 DIAGNOSIS — M545 Low back pain, unspecified: Secondary | ICD-10-CM | POA: Diagnosis not present

## 2019-12-30 DIAGNOSIS — Z7982 Long term (current) use of aspirin: Secondary | ICD-10-CM | POA: Insufficient documentation

## 2019-12-30 DIAGNOSIS — G8929 Other chronic pain: Secondary | ICD-10-CM

## 2019-12-30 LAB — CBC
Hematocrit: 38.9 % (ref 34.0–46.6)
Hemoglobin: 13.5 g/dL (ref 11.1–15.9)
MCH: 31 pg (ref 26.6–33.0)
MCHC: 34.7 g/dL (ref 31.5–35.7)
MCV: 89 fL (ref 79–97)
Platelets: 301 10*3/uL (ref 150–450)
RBC: 4.35 x10E6/uL (ref 3.77–5.28)
RDW: 12.8 % (ref 11.7–15.4)
WBC: 7.9 10*3/uL (ref 3.4–10.8)

## 2019-12-30 LAB — COMPREHENSIVE METABOLIC PANEL
ALT: 20 IU/L (ref 0–32)
AST: 22 IU/L (ref 0–40)
Albumin/Globulin Ratio: 2 (ref 1.2–2.2)
Albumin: 4.8 g/dL — ABNORMAL HIGH (ref 3.7–4.7)
Alkaline Phosphatase: 79 IU/L (ref 44–121)
BUN/Creatinine Ratio: 22 (ref 12–28)
BUN: 15 mg/dL (ref 8–27)
Bilirubin Total: 0.5 mg/dL (ref 0.0–1.2)
CO2: 21 mmol/L (ref 20–29)
Calcium: 9.7 mg/dL (ref 8.7–10.3)
Chloride: 102 mmol/L (ref 96–106)
Creatinine, Ser: 0.68 mg/dL (ref 0.57–1.00)
GFR calc Af Amer: 100 mL/min/{1.73_m2} (ref >59)
GFR calc non Af Amer: 87 mL/min/{1.73_m2} (ref >59)
Globulin, Total: 2.4 g/dL (ref 1.5–4.5)
Glucose: 97 mg/dL (ref 65–99)
Potassium: 4.1 mmol/L (ref 3.5–5.2)
Sodium: 141 mmol/L (ref 134–144)
Total Protein: 7.2 g/dL (ref 6.0–8.5)

## 2019-12-30 LAB — LIPID PANEL
Chol/HDL Ratio: 2.3 ratio (ref 0.0–4.4)
Cholesterol, Total: 219 mg/dL — ABNORMAL HIGH (ref 100–199)
HDL: 96 mg/dL (ref >39)
LDL Chol Calc (NIH): 107 mg/dL — ABNORMAL HIGH (ref 0–99)
Triglycerides: 91 mg/dL (ref 0–149)
VLDL Cholesterol Cal: 16 mg/dL (ref 5–40)

## 2019-12-30 LAB — HEMOGLOBIN A1C
Est. average glucose Bld gHb Est-mCnc: 120 mg/dL
Hgb A1c MFr Bld: 5.8 % — ABNORMAL HIGH (ref 4.8–5.6)

## 2019-12-30 LAB — VITAMIN D 25 HYDROXY (VIT D DEFICIENCY, FRACTURES): Vit D, 25-Hydroxy: 47.7 ng/mL (ref 30.0–100.0)

## 2019-12-30 LAB — TSH: TSH: 1.35 u[IU]/mL (ref 0.450–4.500)

## 2019-12-30 MED ORDER — METHOCARBAMOL 500 MG PO TABS
750.0000 mg | ORAL_TABLET | Freq: Once | ORAL | Status: AC
  Administered 2019-12-30: 750 mg via ORAL
  Filled 2019-12-30: qty 2

## 2019-12-30 MED ORDER — PREDNISONE 20 MG PO TABS: ORAL_TABLET | ORAL | 0 refills | Status: DC

## 2019-12-30 MED ORDER — DEXAMETHASONE SODIUM PHOSPHATE 10 MG/ML IJ SOLN
10.0000 mg | Freq: Once | INTRAMUSCULAR | Status: AC
  Administered 2019-12-30: 10 mg via INTRAMUSCULAR
  Filled 2019-12-30: qty 1

## 2019-12-30 MED ORDER — HYDROMORPHONE HCL 1 MG/ML IJ SOLN
1.0000 mg | Freq: Once | INTRAMUSCULAR | Status: AC
  Administered 2019-12-30: 1 mg via INTRAMUSCULAR
  Filled 2019-12-30: qty 1

## 2019-12-30 MED ORDER — TRAMADOL HCL 50 MG PO TABS
50.0000 mg | ORAL_TABLET | Freq: Four times a day (QID) | ORAL | 0 refills | Status: DC | PRN
Start: 2019-12-30 — End: 2020-01-12

## 2019-12-30 MED ORDER — OXYCODONE-ACETAMINOPHEN 5-325 MG PO TABS
1.0000 | ORAL_TABLET | Freq: Once | ORAL | Status: AC
  Administered 2019-12-30: 1 via ORAL
  Filled 2019-12-30: qty 1

## 2019-12-30 NOTE — ED Notes (Signed)
DC  Instructions reviewed with pt. PT verbalized understanding. Pt DC

## 2019-12-30 NOTE — ED Notes (Signed)
Daughter would like to be updated [phone]

## 2019-12-30 NOTE — Discharge Instructions (Addendum)
It was our pleasure to provide your ER care today - we hope that you feel better.  Avoid bending at waist, or heavy lifting > 10 lbs for the next week. Try heat therapy. Try to find position of comfort - for example, lying on side with pillow between legs, or on back with pillow behind knees.   Take prednisone as prescribed. Take ultram as need for pain - no driving for the next 6 hours or when taking ultram.   Also follow up with your doctor for recheck of blood pressure in the next 1-2 weeks, as it is high today.   Follow up with your back specialist this Wednesday as planned.   Return to ER if worse, new symptoms, fevers, problems with normal bowel/bladder function, saddle area/perineal numbness, new leg numbness/weakness, intractable pain, or other concern.

## 2019-12-30 NOTE — ED Provider Notes (Signed)
[facility]PART[facility] Provider Note   CSN: [id number] Arrival date & time: 12/30/19  1044     History Chief Complaint  Patient presents with  . Back Pain    Brittany Harvey is a 74 y.o. female.  Patient c/o low back pain radiating to left leg. Hx same, states symptoms gradual onset months ago, states saw ortho/Dr Marlou Sa in Sept, had mri, but did not follow up for epidural/facet injection. Saw pcp yesterday with same, taking meloxicam, but pain not controlled. No acute or abrupt change today, but persistence of symptoms. Pain dull, low back, radiating posterior/lat left leg, with occasional numbness/tingling to left foot. No saddle area numbness. No urinary retention or incontinence. No stool incontinence. States has f/u with her orthopedist/Dr Marlou Sa this Wednesday.   The history is provided by the patient.  Back Pain Associated symptoms: no abdominal pain, no chest pain, no fever and no headaches        Past Medical History:  Diagnosis Date  . Anxiety   . Atypical chest pain   . GERD (gastroesophageal reflux disease)   . Heart palpitations   . Hypertension   . Kidney stones   . PSVT (paroxysmal supraventricular tachycardia) [facility])     Patient Active Problem List   Diagnosis Date Noted  . Statin myopathy 10/10/2019  . High risk medications (not anticoagulants) long-term use 02/27/2019  . Arthritis 11/15/2018  . Osteoporosis 11/15/2018  . Family history of stroke- daughter age 46 09/22/2018  . History of nephrolithiasis 07/22/2018  . Statin declined 03/16/2018  . White coat syndrome with hypertension 03/15/2017  . Vitamin D deficiency 11/10/2016  . Stressful job 08/12/2016  . Tiredness 08/12/2016  . Nonintractable headache 08/12/2016  . Other acute fatigue 08/12/2016  . Benign positional vertigo, bilateral 06/10/2016  . Environmental and seasonal allergies 06/10/2016  . Upper back strain 05/07/2016  . Cough 03/06/2016  . Prediabetes  09/24/2015  . Overweight (BMI 25.0-29.9) 09/24/2015  . Elevated HDL = 90 09/24/2015  . Fatigue 09/24/2015  . Osteopenia by Dexa (8-9 yrs ago)  08/20/2015  . Hyperlipidemia 08/20/2015  . Hypertensive disorder 07/24/2014  . GERD (gastroesophageal reflux disease) 11/27/2013  . Chest pain 04/25/2013  . PSVT (paroxysmal supraventricular tachycardia) ([place]) 03/27/2013    Past Surgical History:  Procedure Laterality Date  . CARDIOVASCULAR STRESS TEST  06/19/2009   Post stress myocardial perfusion images show a normal pattern of perfusion in all regions. ECG positive for ischemia. Appears to be a "false-positive" ECG stress test.  . TUBAL LIGATION       OB History   No obstetric history on file.     Family History  Adopted: Yes    Social History   Tobacco Use  . Smoking status: Former Smoker    Quit date: 01/27/1992    Years since quitting: 27.9  . Smokeless tobacco: Never Used  Vaping Use  . Vaping Use: Never used  Substance Use Topics  . Alcohol use: No  . Drug use: No    Home Medications Prior to Admission medications   Medication Sig Start Date End Date Taking? Authorizing Provider  acetaminophen (TYLENOL) 500 MG tablet Take 500 mg by mouth daily as needed (pain).    [provider]  Apple Cider Vinegar 500 MG TABS apple cider vinegar    [provider]  aspirin EC 81 MG tablet Take 81 mg by mouth daily.    [provider]  Calcium Citrate-Vitamin D (CALCIUM CITRATE + D  PO) Take 1 tablet by mouth daily.    [provider]  Cholecalciferol (VITAMIN D3) 1000 units CAPS Take 5 capsules by mouth daily. Told pt to increase to 5,000 IU per day    [provider]  Cranberry 1000 MG CAPS Take 1 capsule by mouth daily.    [provider]  fluticasone (FLONASE) 50 MCG/ACT nasal spray Place 1 spray into both nostrils 2 (two) times daily. 06/10/16   Opalski, Neoma Laming, DO  ibuprofen (ADVIL,MOTRIN) 600 MG tablet Take 1 tablet (600 mg  total) by mouth every 8 (eight) hours as needed. 01/27/17   Opalski, Neoma Laming, DO  meloxicam (MOBIC) 15 MG tablet Take 1 tablet (15 mg total) by mouth daily. 12/29/19   Lorrene Reid, PA-C  meloxicam (MOBIC) 7.5 MG tablet Take 1 tablet (7.5 mg total) by mouth daily. 11/16/19 11/15/20  Magnant, Charles L, PA-C  metoprolol succinate (TOPROL-XL) 25 MG 24 hr tablet TAKE 1 AND 1/2 TABLETS EVERY DAY 12/28/19   Lorretta Harp, MD  Multiple Vitamin (MULTIVITAMIN WITH MINERALS) TABS tablet Take 1 tablet by mouth daily. Centrum Silver    [provider]  triamcinolone cream (KENALOG) 0.1 % Apply 1 application topically daily as needed (itching/eczema). 07/05/19   Lorrene Reid, PA-C    Allergies    Clindamycin/lincomycin, Penicillins, Tussin dm [guaifenesin-dm], and Zithromax [azithromycin]  Review of Systems   Review of Systems  Constitutional: Negative for chills and fever.  HENT: Negative for sore throat.   Eyes: Negative for redness.  Respiratory: Negative for shortness of breath.   Cardiovascular: Negative for chest pain.  Gastrointestinal: Negative for abdominal pain.  Genitourinary: Negative for flank pain.  Musculoskeletal: Positive for back pain. Negative for neck pain.  Skin: Negative for rash.  Neurological: Negative for headaches.  Hematological: Does not bruise/bleed easily.  Psychiatric/Behavioral: Negative for confusion.    Physical Exam Updated Vital Signs BP (!) 187/57 (BP Location: Left Arm)   Pulse 73   Temp 98 F (36.7 C) (Oral)   Resp 20   SpO2 100%   Physical Exam Vitals and nursing note reviewed.  Constitutional:      Appearance: Normal appearance. She is well-developed.  HENT:     Head: Atraumatic.     Nose: Nose normal.     Mouth/Throat:     Mouth: Mucous membranes are moist.  Eyes:     General: No scleral icterus.    Conjunctiva/sclera: Conjunctivae normal.  Neck:     Trachea: No tracheal deviation.  Cardiovascular:     Rate and Rhythm:  Normal rate.     Pulses: Normal pulses.  Pulmonary:     Effort: Pulmonary effort is normal. No respiratory distress.  Abdominal:     General: There is no distension.     Tenderness: There is no abdominal tenderness.  Genitourinary:    Comments: No cva tenderness.  Musculoskeletal:        General: No swelling.     Cervical back: Neck supple. No muscular tenderness.     Comments: T/L/S spine non tender, aligned, no step off. Left lumbar muscular and left sciatic notch area tenderness. Distal pulses palp. No leg swelling.   Skin:    General: Skin is warm and dry.     Findings: No rash.  Neurological:     Mental Status: She is alert.     Comments: Alert, speech normal. LLE motor intact, stre 5/5. Sens pressure/touch grossly intact. Steady gait. Reflexes present.   Psychiatric:  Mood and Affect: Mood normal.     ED Results / Procedures / Treatments   Labs (all labs ordered are listed, but only abnormal results are displayed) Labs Reviewed - No data to display  EKG None  Radiology No results found.  Procedures Procedures (including critical care time)  Medications Ordered in ED Medications  dexamethasone (DECADRON) injection 10 mg (has no administration in time range)  oxyCODONE-acetaminophen (PERCOCET/ROXICET) 5-325 MG per tablet 1 tablet (has no administration in time range)    ED Course  I have reviewed the triage vital signs and the nursing notes.  Pertinent labs & imaging results that were available during my care of the patient were reviewed by me and considered in my medical decision making (see chart for details).    MDM Rules/Calculators/A&P                          Reviewed nursing notes and prior charts for additional history.  Reviewed recent pcp note and prior MRI - pt with L5/S1 ddd, facet arthropathy.   Decadron im. Percocet po. Pt indicates has ride, does not have to drive.   Recheck pt comfortable, no distress. No new numbness/weakness or  bowel/bladder c/o, and has close f/u already arranged w specialist.   Return precautions provided. rx for home.    Final Clinical Impression(s) / ED Diagnoses Final diagnoses:  None    Rx / DC Orders ED Discharge Orders    None       Lajean Saver, MD 12/30/19 1708

## 2019-12-30 NOTE — ED Triage Notes (Signed)
Pt reports lower back pain with L sided sciatica that she states has been going on for "a while"  Had MRI and is seeing Dr. Moshe Cipro for same.

## 2020-01-01 ENCOUNTER — Telehealth: Payer: Self-pay | Admitting: Orthopedic Surgery

## 2020-01-01 NOTE — Telephone Encounter (Signed)
Please advise. Thanks.  

## 2020-01-01 NOTE — Telephone Encounter (Signed)
Pt called stating Dr.Dean had mentioned doing an injection in her hip and the pt would like to know if she could have this done at her appt on 01/03/20; pt would like a CB to be updated   [phone]

## 2020-01-02 ENCOUNTER — Telehealth: Payer: Self-pay | Admitting: Cardiovascular Disease

## 2020-01-02 ENCOUNTER — Telehealth: Payer: Self-pay | Admitting: Physician Assistant

## 2020-01-02 MED ORDER — METOPROLOL SUCCINATE ER 25 MG PO TB24
25.0000 mg | ORAL_TABLET | Freq: Every day | ORAL | 0 refills | Status: DC
Start: 2020-01-02 — End: 2021-02-24

## 2020-01-02 NOTE — Telephone Encounter (Signed)
Holding for LF as FYI

## 2020-01-02 NOTE — Telephone Encounter (Signed)
Difficult time of year, needs bilateral L5-S1 facet joint injections. Everybody wants in before end of year. See what you can do or [place]. If here and you squezze her in let her know it will be quick injection. Needs driver etc.

## 2020-01-02 NOTE — Telephone Encounter (Signed)
Per Lorrene Reid, PA pt was advised to cut tramadol in half and discuss with orthopedist, Dr. Marlou Sa, tomorrow at her appt.  Pt expressed understanding and is agreeable.  Charyl Bigger, CMA

## 2020-01-02 NOTE — Telephone Encounter (Signed)
Pt c/o medication issue:  1. Name of Medication: metoprolol succinate (TOPROL-XL) 25 MG 24 hr tablet  2. How are you currently taking this medication (dosage and times per day)? 1 tablet by mouth daily   3. Are you having a reaction (difficulty breathing--STAT)? No   4. What is your medication issue? [facility] is calling stating [facility] is sending her to much of this medication. She states she has reached out to them about this and they advise her they will hold the medication, but she just received a new prescription. She states she is no longer taking 1 and a 1/2 tablets daily per her PCP request so she is not needing it as often as they are sending it. Please advise.

## 2020-01-02 NOTE — Telephone Encounter (Signed)
Worked in for appointment tomorrow with driver.

## 2020-01-02 NOTE — Telephone Encounter (Signed)
I put in a phone not previously for this patient and she called back to let nurses know metoporol is now cut back to one instead of one a half. Thanks

## 2020-01-02 NOTE — Telephone Encounter (Signed)
See below. IC s/w patient. She would like to just be seen by Dr Ernestina Patches if possible. However she is asking if there is anyway you guys could get her worked in [employer] at some point because her grandaughter is coming from [place] to bring her to her appointment. Please call patient to advise. Thanks.

## 2020-01-02 NOTE — Telephone Encounter (Signed)
Spoke with pt, she is currently taking metoprolol 25 mg once daily. Medication list updated.

## 2020-01-02 NOTE — Telephone Encounter (Signed)
Her best course would be to see Dr. Ernestina Patches for lumbar ESI and facet injection.  That is really the thing that she needs as opposed to coming to see me again..  If she wants to come in I think that is fine but I am going to refer her to Dr. Ernestina Patches and she can save her self a trip if she just wants to go directly there for shot.

## 2020-01-02 NOTE — Telephone Encounter (Signed)
The medication change has been updated in patients chart by her Cardiologist. AS, CMA

## 2020-01-02 NOTE — Telephone Encounter (Signed)
Please advise 

## 2020-01-02 NOTE — Telephone Encounter (Signed)
Patient was put on prednisone after being put in hospital, patient is also taking tramadol, and patient is shaky and sleepy. Patient would like to know if she can take tramadol in half. Thanks

## 2020-01-02 NOTE — Telephone Encounter (Signed)
Will this be for just a hip injection?

## 2020-01-02 NOTE — Telephone Encounter (Signed)
Dr Marlou Sa suggested ESI vs facet

## 2020-01-03 ENCOUNTER — Ambulatory Visit (INDEPENDENT_AMBULATORY_CARE_PROVIDER_SITE_OTHER): Payer: Medicare Other | Admitting: Physical Medicine and Rehabilitation

## 2020-01-03 ENCOUNTER — Encounter: Payer: Self-pay | Admitting: Physician Assistant

## 2020-01-03 ENCOUNTER — Ambulatory Visit: Payer: Self-pay

## 2020-01-03 ENCOUNTER — Encounter: Payer: Self-pay | Admitting: Physical Medicine and Rehabilitation

## 2020-01-03 ENCOUNTER — Ambulatory Visit (INDEPENDENT_AMBULATORY_CARE_PROVIDER_SITE_OTHER): Payer: Medicare Other | Admitting: Physician Assistant

## 2020-01-03 ENCOUNTER — Other Ambulatory Visit: Payer: Self-pay

## 2020-01-03 ENCOUNTER — Ambulatory Visit: Payer: Medicare Other | Admitting: Orthopedic Surgery

## 2020-01-03 VITALS — BP 118/59 | HR 65 | Temp 98.0°F | Ht 61.0 in | Wt 140.8 lb

## 2020-01-03 VITALS — BP 159/71 | HR 70

## 2020-01-03 DIAGNOSIS — Z0000 Encounter for general adult medical examination without abnormal findings: Secondary | ICD-10-CM | POA: Diagnosis not present

## 2020-01-03 DIAGNOSIS — M47816 Spondylosis without myelopathy or radiculopathy, lumbar region: Secondary | ICD-10-CM

## 2020-01-03 MED ORDER — METHYLPREDNISOLONE ACETATE 80 MG/ML IJ SUSP
80.0000 mg | Freq: Once | INTRAMUSCULAR | Status: AC
  Administered 2020-01-03: 80 mg

## 2020-01-03 NOTE — Progress Notes (Signed)
Left sided low back and buttock pain radiating down leg. Numbness in toes. Numeric Pain Rating Scale and Functional Assessment Average Pain 9   In the last MONTH (on 0-10 scale) has pain interfered with the following?  1. General activity like being  able to carry out your everyday physical activities such as walking, climbing stairs, carrying groceries, or moving a chair?  Rating(9)   +Driver, -BT, -Dye Allergies.

## 2020-01-03 NOTE — Progress Notes (Signed)
Virtual Visit via Telephone Note:  I connected with Brittany Harvey. Budney by telephone and verified that I am speaking with the correct person using two identifiers.    I discussed the limitations, risks, security and privacy concerns for performing an evaluation and management service by telephone and the availability of in person appointments. The staff discussed with patient that there may be a patient responsible charge related to this service. The patient expressed understanding and agreed to proceed.   Location of Patient- Home Location of Provider- Office    Subjective:   Brittany Harvey is a 74 y.o. female who presents for Medicare Annual (Subsequent) preventive examination.  Review of Systems    General:   No F/C, wt loss Pulm:   No DIB, SOB, pleuritic chest pain Card:  No CP, palpitations Abd:  No n/v/d or pain Ext:  No inc edema from baseline    Objective:    Today's Vitals   01/03/20 1418  BP: (!) 118/59  Pulse: 65  Temp: 98 F (36.7 C)  SpO2: 97%  Weight: 140 lb 12.8 oz (63.9 kg)  Height: 5\' 1"  (1.549 m)   Body mass index is 26.6 kg/m.  Advanced Directives 08/12/2016 08/20/2015 09/09/2014 11/19/2013 11/19/2013 04/26/2013  Does Patient Have a Medical Advance Directive? Yes Yes No No No Patient has advance directive, copy not in chart  Type of Advance Directive Healthcare Power of [place];Living will  Would patient like information on creating a medical advance directive? - - - No - patient declined information No - patient declined information -    Current Medications (verified) Outpatient Encounter Medications as of 01/03/2020  Medication Sig  . acetaminophen (TYLENOL) 500 MG tablet Take 500 mg by mouth daily as needed (pain).  . Apple Cider Vinegar 500 MG TABS apple cider vinegar  . aspirin EC 81 MG tablet Take 81 mg by mouth daily.  . Calcium Citrate-Vitamin D (CALCIUM CITRATE + D PO) Take 1 tablet by  mouth daily.  . Cholecalciferol (VITAMIN D3) 1000 units CAPS Take 5 capsules by mouth daily. Told pt to increase to 5,000 IU per day  . Cranberry 1000 MG CAPS Take 1 capsule by mouth daily.  . fluticasone (FLONASE) 50 MCG/ACT nasal spray Place 1 spray into both nostrils 2 (two) times daily.  [name] ibuprofen (ADVIL,MOTRIN) 600 MG tablet Take 1 tablet (600 mg total) by mouth every 8 (eight) hours as needed.  . metoprolol succinate (TOPROL-XL) 25 MG 24 hr tablet Take 1 tablet (25 mg total) by mouth daily.  . Multiple Vitamin (MULTIVITAMIN WITH MINERALS) TABS tablet Take 1 tablet by mouth daily. Centrum Silver  . predniSONE (DELTASONE) 20 MG tablet 3 po once a day for 2 days, then 2 po once a day for 3 days, then 1 po once a day for 3 days  . traMADol (ULTRAM) 50 MG tablet Take 1 tablet (50 mg total) by mouth every 6 (six) hours as needed.  . triamcinolone cream (KENALOG) 0.1 % Apply 1 application topically daily as needed (itching/eczema).  . meloxicam (MOBIC) 15 MG tablet Take 1 tablet (15 mg total) by mouth daily. (Patient not taking: Reported on 01/03/2020)  . meloxicam (MOBIC) 7.5 MG tablet Take 1 tablet (7.5 mg total) by mouth daily. (Patient not taking: Reported on 01/03/2020)  . [EXPIRED] methylPREDNISolone acetate (DEPO-MEDROL) injection 80 mg    No facility-administered encounter medications on file as of 01/03/2020.  Allergies (verified) Clindamycin/lincomycin, Penicillins, Tussin dm [guaifenesin-dm], and Zithromax [azithromycin]   History: Past Medical History:  Diagnosis Date  . Anxiety   . Atypical chest pain   . GERD (gastroesophageal reflux disease)   . Heart palpitations   . Hypertension   . Kidney stones   . PSVT (paroxysmal supraventricular tachycardia) ([place])    Past Surgical History:  Procedure Laterality Date  . CARDIOVASCULAR STRESS TEST  06/19/2009   Post stress myocardial perfusion images show a normal pattern of perfusion in all regions. ECG positive for ischemia.  Appears to be a "false-positive" ECG stress test.  . TUBAL LIGATION     Family History  Adopted: Yes   Social History   Socioeconomic History  . Marital status: Married    Spouse name: Not on file  . Number of children: Not on file  . Years of education: Not on file  . Highest education level: Not on file  Occupational History  . Not on file  Tobacco Use  . Smoking status: Former Smoker    Quit date: 01/27/1992    Years since quitting: 27.9  . Smokeless tobacco: Never Used  Vaping Use  . Vaping Use: Never used  Substance and Sexual Activity  . Alcohol use: No  . Drug use: No  . Sexual activity: Never    Birth control/protection: None  Other Topics Concern  . Not on file  Social History Narrative  . Not on file   Social Determinants of Health   Financial Resource Strain:   . Difficulty of Paying Living Expenses: Not on file  Food Insecurity:   . Worried About Charity fundraiser in the Last Year: Not on file  . Ran Out of Food in the Last Year: Not on file  Transportation Needs:   . Lack of Transportation (Medical): Not on file  . Lack of Transportation (Non-Medical): Not on file  Physical Activity:   . Days of Exercise per Week: Not on file  . Minutes of Exercise per Session: Not on file  Stress:   . Feeling of Stress : Not on file  Social Connections:   . Frequency of Communication with Friends and Family: Not on file  . Frequency of Social Gatherings with Friends and Family: Not on file  . Attends Religious Services: Not on file  . Active Member of Clubs or Organizations: Not on file  . Attends Archivist Meetings: Not on file  . Marital Status: Not on file    Tobacco Counseling Counseling given: Not Answered   Diabetic?No     Activities of Daily Living In your present state of health, do you have any difficulty performing the following activities: 01/03/2020 10/10/2019  Hearing? N N  Vision? N N  Difficulty concentrating or making  decisions? N N  Walking or climbing stairs? N N  Dressing or bathing? N N  Doing errands, shopping? N N  Some recent data might be hidden    Patient Care Team: Lorrene Reid, PA-C as PCP - General Gwenlyn Found Pearletha Forge, MD as PCP - Cardiology (Cardiology) Juanita Craver, MD as Consulting Physician (Gastroenterology) Lorretta Harp, MD as Consulting Physician (Cardiology) Everlene Farrier, MD as Consulting Physician (Obstetrics and Gynecology)  Indicate any recent Medical Services you may have received from other than [facility] providers in the past year (date may be approximate).     Assessment:   This is a routine wellness examination for Adaleen.  Hearing/Vision screen No exam data present  Dietary  issues and exercise activities discussed: -Recommend to reduce fried foods and follow a heart healthy diet. Patient has history of statin intolerance. Monitor carbohydrates and glucose. Continue to stay as active as possible.  Goals   None    Depression Screen PHQ 2/9 Scores 01/03/2020 10/10/2019 07/05/2019 01/12/2019 09/22/2018 06/28/2018 03/16/2018  PHQ - 2 Score 0 0 0 0 0 0 0  PHQ- 9 Score 0 0 0 0 0 0 0    Fall Risk Fall Risk  01/03/2020 10/10/2019 01/12/2019 09/22/2018 12/22/2017  Falls in the past year? 0 0 0 0 0  Number falls in past yr: - - 0 0 -  Injury with Fall? - - 0 - -  Follow up Falls evaluation completed Falls evaluation completed Falls evaluation completed Falls evaluation completed -    FALL RISK PREVENTION PERTAINING TO THE HOME:  Any stairs in or around the home? Yes  If so, are there any without handrails? Yes  Home free of loose throw rugs in walkways, pet beds, electrical cords, etc? Yes  Adequate lighting in your home to reduce risk of falls? Yes   ASSISTIVE DEVICES UTILIZED TO PREVENT FALLS:  Life alert? Yes  Use of a cane, walker or w/c? No  Grab bars in the bathroom? Yes  Shower chair or bench in shower? Yes  Elevated toilet seat or a handicapped toilet? No    TIMED UP AND GO:  Was the test performed? No .  Length of time to ambulate 10 feet: 0 sec.  TELEHEALTH  Cognitive Function: wnl     6CIT Screen 01/03/2020 01/12/2019 12/22/2017  What Year? 0 points 0 points 0 points  What month? 0 points 0 points 0 points  What time? 0 points 0 points 0 points  Count back from 20 0 points 0 points 0 points  Months in reverse 0 points 0 points 0 points  Repeat phrase 0 points 6 points 0 points  Total Score 0 6 0    Immunizations Immunization History  Administered Date(s) Administered  . Influenza, High Dose Seasonal PF 10/31/2015, 10/29/2016, 11/18/2017, 11/02/2019  . Influenza-Unspecified 11/04/2018  . Pneumococcal Conjugate-13 05/10/2015  . Pneumococcal Polysaccharide-23 01/18/2013  . Tdap 12/09/2010, 09/30/2012  . Zoster Recombinat (Shingrix) 12/22/2017    TDAP status: Up to date  Flu Vaccine status: Up to date  Pneumococcal vaccine status: Up to date  Covid-19 vaccine status: Completed vaccines  Qualifies for Shingles Vaccine? Yes   Zostavax completed No   Shingrix Completed?: No.    Education has been provided regarding the importance of this vaccine. Patient has been advised to call insurance company to determine out of pocket expense if they have not yet received this vaccine. Advised may also receive vaccine at local pharmacy or Health Dept. Verbalized acceptance and understanding.  Screening Tests Health Maintenance  Topic Date Due  . COVID-19 Vaccine (1) Never done  . MAMMOGRAM  11/16/2020  . TETANUS/TDAP  10/01/2022  . COLONOSCOPY  09/23/2023  . INFLUENZA VACCINE  Completed  . DEXA SCAN  Completed  . Hepatitis C Screening  Completed  . PNA vac Low Risk Adult  Completed    Health Maintenance  Health Maintenance Due  Topic Date Due  . COVID-19 Vaccine (1) Never done    Colonoscopy: Scheduled for 2022 with Dr. Collene Mares  Mammogram status: Completed 11/21/2019. Repeat every year  Dexa: Done 11/21/2019 osteopenia  repeat 2 yrs  Lung Cancer Screening: (Low Dose CT Chest recommended if Age 54-80 years, 30 pack-year currently  smoking OR have quit w/in 15years.) does not qualify.   Lung Cancer Screening Referral: no  Additional Screening:  Hepatitis C Screening: does qualify; Completed 05/09/19  Vision Screening: Recommended annual ophthalmology exams for early detection of glaucoma and other disorders of the eye. Is the patient up to date with their annual eye exam?  Yes  Who is the provider or what is the name of the office in which the patient attends annual eye exams? [facility] If pt is not established with a provider, would they like to be referred to a provider to establish care? No .   Dental Screening: Recommended annual dental exams for proper oral hygiene  Community Resource Referral / Chronic Care Management: CRR required this visit?  No   CCM required this visit?  No      Plan:  -Discussed most recent labs. Most labs are essentially within normal limits or stable from prior with the exception of lipid panel. Patient prefers to manage with diet and lifestyle modifications. Unable to tolerate statin in the past. -Continue to follow up with various specialists. -Follow up in 4 months for prediabetes, HLD and FBW (repeat lipid panel).  I have personally reviewed and noted the following in the patient's chart:   . Medical and social history . Use of alcohol, tobacco or illicit drugs  . Current medications and supplements . Functional ability and status . Nutritional status . Physical activity . Advanced directives . List of other physicians . Hospitalizations, surgeries, and ER visits in previous 12 months . Vitals . Screenings to include cognitive, depression, and falls . Referrals and appointments  In addition, I have reviewed and discussed with patient certain preventive protocols, quality metrics, and best practice recommendations. A written personalized care plan for  preventive services as well as general preventive health recommendations were provided to patient.

## 2020-01-08 ENCOUNTER — Telehealth: Payer: Self-pay | Admitting: Physician Assistant

## 2020-01-08 NOTE — Telephone Encounter (Signed)
Patient had two shots in her back, one on each side and it having back pain and having trouble working. She would like a muscle relaxer called into [facility]. Thanks

## 2020-01-09 ENCOUNTER — Telehealth: Payer: Self-pay | Admitting: Orthopedic Surgery

## 2020-01-09 ENCOUNTER — Other Ambulatory Visit: Payer: Self-pay | Admitting: Surgical

## 2020-01-09 MED ORDER — METHOCARBAMOL 500 MG PO TABS: 500.0000 mg | ORAL_TABLET | Freq: Three times a day (TID) | ORAL | 0 refills | Status: DC | PRN

## 2020-01-09 NOTE — Telephone Encounter (Signed)
Patient called for an update about muscle relaxer medication. Please call patient when sent to pharmacy at [phone].

## 2020-01-09 NOTE — Telephone Encounter (Signed)
Pt called stating her left hip is drawing up which is causing her pain and she's not even able to lay on her left side; pt would like a muscle relaxer to ease her hip. Pt would like to be notified if this can be sent in and when it has been done so   [phone]

## 2020-01-09 NOTE — Telephone Encounter (Signed)
Sent in to [facility]

## 2020-01-09 NOTE — Telephone Encounter (Signed)
IC advised Dr Marlou Sa and Lurena Joiner in surgery all day today  Also advised of our 24 hour prescription policy. Advised her would let her know as soon as I heard back form them.

## 2020-01-09 NOTE — Telephone Encounter (Signed)
Please advise. Thanks.  

## 2020-01-10 NOTE — Telephone Encounter (Signed)
I called patient and advised. 

## 2020-01-12 ENCOUNTER — Ambulatory Visit (INDEPENDENT_AMBULATORY_CARE_PROVIDER_SITE_OTHER): Payer: Medicare Other | Admitting: Surgical

## 2020-01-12 ENCOUNTER — Encounter: Payer: Self-pay | Admitting: Surgical

## 2020-01-12 ENCOUNTER — Ambulatory Visit (INDEPENDENT_AMBULATORY_CARE_PROVIDER_SITE_OTHER): Payer: Medicare Other

## 2020-01-12 ENCOUNTER — Other Ambulatory Visit: Payer: Self-pay

## 2020-01-12 DIAGNOSIS — M5442 Lumbago with sciatica, left side: Secondary | ICD-10-CM | POA: Diagnosis not present

## 2020-01-12 DIAGNOSIS — M25562 Pain in left knee: Secondary | ICD-10-CM

## 2020-01-12 MED ORDER — ACETAMINOPHEN-CODEINE #3 300-30 MG PO TABS
1.0000 | ORAL_TABLET | Freq: Two times a day (BID) | ORAL | 0 refills | Status: DC | PRN
Start: 2020-01-12 — End: 2020-05-07

## 2020-01-12 MED ORDER — GABAPENTIN 100 MG PO CAPS: 100.0000 mg | ORAL_CAPSULE | Freq: Three times a day (TID) | ORAL | 0 refills | Status: DC

## 2020-01-12 NOTE — Progress Notes (Signed)
Office Visit Note   Patient: Brittany Harvey           Date of Birth: [birth year]           MRN: [id number] Visit Date: 01/12/2020 Requested by: Lorrene Reid, PA-C [address] [place],  [place] [address] PCP: Lorrene Reid, PA-C  Subjective: Chief Complaint  Patient presents with  . Lower Back - Pain    HPI: Brittany Harvey is a 74 y.o. female who presents to the office complaining of buttocks pain and left leg pain.  Patient has history of severe facet joint arthritis at L5-S1 that was found on MRI scan of the sacrum.  She had injection by Dr. Ernestina Patches on 01/03/2020 that provided no significant relief and she states made her pain worse.  She notes continued numbness and tingling down her leg with subjective weakness of the left leg.  She states this numbness and tingling that travels down her leg is associated with pain that feels radicular and travels from her low back and left buttocks down into her left foot.  She notes her leg feels like it wants to give out on her when she walks.  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                ROS: All systems reviewed are negative as they relate to the chief complaint within the history of present illness.  Patient denies fevers or chills.  Assessment & Plan: Visit Diagnoses:  1. Left-sided low back pain with left-sided sciatica, unspecified chronicity   2. Left knee pain, unspecified chronicity     Plan: Patient is a 74 year old female who presents complaining of radicular left leg pain.  She was seen by Dr. Marlou Sa on 10/04/2019 for coccygeal pain and low back pain.  At the time she was having some pain radiating down toward the knee on the left side but this was not her main concern.  Today she returns 3 months later with significantly worsening pain and her radicular leg pain traveling all the way to her foot.  Her radicular leg pain is now the main concern and it is steadily worsening.  She  had no significant relief from injection by Dr. Ernestina Patches on 01/03/2020.  Impression is radicular pain from nerve involvement of the lumbar spine.  She had mild spinal stenosis and mild foraminal narrowing noted at L5-S1 on her sacrum MRI but unlikely that her severe pain is coming from this area and further evaluation of the lumbar spine is warranted.  Ordered MRI of the lumbar spine for further evaluation.  Prescribed gabapentin and Tylenol 3 for symptomatic relief.  Follow-up after MRI to review results with likely epidural steroid injections to follow-up  Follow-Up Instructions: No follow-ups on file.   Orders:  Orders Placed This Encounter  Procedures  . XR Lumbar Spine 2-3 Views  . XR KNEE 3 VIEW LEFT  . MR Lumbar Spine w/o contrast   Meds ordered this encounter  Medications  . gabapentin (NEURONTIN) 100 MG capsule    Sig: Take 1 capsule (100 mg total) by mouth 3 (three) times daily.    Dispense:  90 capsule    Refill:  0  . acetaminophen-codeine (TYLENOL #3) 300-30 MG tablet    Sig: Take 1 tablet by mouth every 12 (twelve) hours as needed for moderate pain.    Dispense:  30 tablet  Refill:  0      Procedures: No procedures performed   Clinical Data: No additional findings.  Objective: Vital Signs: There were no vitals taken for this visit.  Physical Exam:  Constitutional: Patient appears well-developed HEENT:  Head: Normocephalic Eyes:EOM are normal Neck: Normal range of motion Cardiovascular: Normal rate Pulmonary/chest: Effort normal Neurologic: Patient is alert Skin: Skin is warm Psychiatric: Patient has normal mood and affect  Ortho Exam: Ortho exam demonstrates left leg with decreased sensation throughout all dermatomes of the left lower extremity when compared with the right lower extremity.  Positive straight leg raise of the left leg.  Tenderness throughout the buttocks, particularly over the left SI joint.  Tenderness over the axial lumbar spine mildly.   5/5 motor strength of the bilateral hip flexors, quadricep, hamstring, dorsiflexion, plantarflexion.  Specialty Comments:  No specialty comments available.  Imaging: No results found.   PMFS History: Patient Active Problem List   Diagnosis Date Noted  . Statin myopathy 10/10/2019  . High risk medications (not anticoagulants) long-term use 02/27/2019  . Arthritis 11/15/2018  . Osteoporosis 11/15/2018  . Family history of stroke- daughter age 72 09/22/2018  . History of nephrolithiasis 07/22/2018  . Statin declined 03/16/2018  . White coat syndrome with hypertension 03/15/2017  . Vitamin D deficiency 11/10/2016  . Stressful job 08/12/2016  . Tiredness 08/12/2016  . Nonintractable headache 08/12/2016  . Other acute fatigue 08/12/2016  . Benign positional vertigo, bilateral 06/10/2016  . Environmental and seasonal allergies 06/10/2016  . Upper back strain 05/07/2016  . Cough 03/06/2016  . Prediabetes 09/24/2015  . Overweight (BMI 25.0-29.9) 09/24/2015  . Elevated HDL = 90 09/24/2015  . Fatigue 09/24/2015  . Osteopenia by Dexa (8-9 yrs ago)  08/20/2015  . Hyperlipidemia 08/20/2015  . Hypertensive disorder 07/24/2014  . GERD (gastroesophageal reflux disease) 11/27/2013  . Chest pain 04/25/2013  . PSVT (paroxysmal supraventricular tachycardia) ([place]) 03/27/2013   Past Medical History:  Diagnosis Date  . Anxiety   . Atypical chest pain   . GERD (gastroesophageal reflux disease)   . Heart palpitations   . Hypertension   . Kidney stones   . PSVT (paroxysmal supraventricular tachycardia) ([place])     Family History  Adopted: Yes    Past Surgical History:  Procedure Laterality Date  . CARDIOVASCULAR STRESS TEST  06/19/2009   Post stress myocardial perfusion images show a normal pattern of perfusion in all regions. ECG positive for ischemia. Appears to be a "false-positive" ECG stress test.  . TUBAL LIGATION     Social History   Occupational History  . Not on file   Tobacco Use  . Smoking status: Former Smoker    Quit date: 01/27/1992    Years since quitting: 27.9  . Smokeless tobacco: Never Used  Vaping Use  . Vaping Use: Never used  Substance and Sexual Activity  . Alcohol use: No  . Drug use: No  . Sexual activity: Never    Birth control/protection: None

## 2020-01-18 ENCOUNTER — Ambulatory Visit
Admission: RE | Admit: 2020-01-18 | Discharge: 2020-01-18 | Disposition: A | Discharge status: Home / Self Care | Payer: Medicare Other | Source: Ambulatory Visit | Attending: Surgical | Admitting: Surgical

## 2020-01-18 DIAGNOSIS — M545 Low back pain, unspecified: Secondary | ICD-10-CM | POA: Diagnosis not present

## 2020-01-18 DIAGNOSIS — M48061 Spinal stenosis, lumbar region without neurogenic claudication: Secondary | ICD-10-CM | POA: Diagnosis not present

## 2020-01-18 DIAGNOSIS — M5442 Lumbago with sciatica, left side: Secondary | ICD-10-CM

## 2020-01-22 ENCOUNTER — Other Ambulatory Visit: Payer: Self-pay | Admitting: Radiology

## 2020-01-22 DIAGNOSIS — M5442 Lumbago with sciatica, left side: Secondary | ICD-10-CM

## 2020-01-23 ENCOUNTER — Telehealth: Payer: Self-pay | Admitting: Orthopedic Surgery

## 2020-01-23 NOTE — Telephone Encounter (Signed)
[employer] forms received. Sent to [facility] 

## 2020-01-24 ENCOUNTER — Other Ambulatory Visit: Payer: Self-pay

## 2020-01-24 ENCOUNTER — Telehealth: Payer: Self-pay

## 2020-01-24 ENCOUNTER — Ambulatory Visit (INDEPENDENT_AMBULATORY_CARE_PROVIDER_SITE_OTHER): Payer: Medicare Other | Admitting: Surgical

## 2020-01-24 DIAGNOSIS — M5442 Lumbago with sciatica, left side: Secondary | ICD-10-CM

## 2020-01-24 DIAGNOSIS — M48061 Spinal stenosis, lumbar region without neurogenic claudication: Secondary | ICD-10-CM

## 2020-01-24 NOTE — Telephone Encounter (Signed)
Referral cancelled. 

## 2020-01-24 NOTE — Telephone Encounter (Signed)
Could either of you please cancel neurosurgery referral for patient? Thanks.

## 2020-01-25 ENCOUNTER — Telehealth: Payer: Self-pay | Admitting: Physical Medicine and Rehabilitation

## 2020-01-25 NOTE — Telephone Encounter (Signed)
Pt states she is suppose to be getting a cyst drained and a shot in her hip. She says [facility] was suppose to be referring her to Dr.Newton

## 2020-01-25 NOTE — Telephone Encounter (Signed)
We put in an order on 12/27. Looks like Dr Alvester Morin sent note back to [place] today

## 2020-01-25 NOTE — Progress Notes (Signed)
Brittany Harvey - 74 y.o. female MRN [id number]  Date of birth: 26-Feb-1945  Office Visit Note: Visit Date: 01/03/2020 PCP: Lorrene Reid, PA-C Referred by: Lorrene Reid, PA-C  Subjective: Chief Complaint  Patient presents with  . Lower Back - Pain   HPI:  Brittany Harvey is a 74 y.o. female who comes in today at the request of Annie Main, PA-C for planned Left  L5-S1 Lumbar facet/medial branch block with fluoroscopic guidance.  The patient has failed conservative care including home exercise, medications, time and activity modification.  This injection will be diagnostic and hopefully therapeutic.  Please see requesting physician notes for further details and justification.  Exam has shown concordant pain with facet joint loading.  MRI of the pelvis reviewed with images and spine model.  MRI reviewed in the note below.  Depending on relief consider transforaminal epidural injection at S1 or L4.  Consider lumbar spine MRI.   ROS Otherwise per HPI.  Assessment & Plan: Visit Diagnoses:    ICD-10-CM   1. Spondylosis without myelopathy or radiculopathy, lumbar region  M47.816 XR C-ARM NO REPORT    Facet Injection    methylPREDNISolone acetate (DEPO-MEDROL) injection 80 mg    Plan: No additional findings.   Meds & Orders:  Meds ordered this encounter  Medications  . methylPREDNISolone acetate (DEPO-MEDROL) injection 80 mg    Orders Placed This Encounter  Procedures  . Facet Injection  . XR C-ARM NO REPORT    Follow-up: Return if symptoms worsen or fail to improve.   Procedures: No procedures performed  Lumbar Facet Joint Intra-Articular Injection(s) with Fluoroscopic Guidance  Patient: Brittany Harvey      Date of Birth: 01-04-1946 MRN: [id number] PCP: Lorrene Reid, PA-C      Visit Date: 01/03/2020   Universal Protocol:    Date/Time: 01/03/2020  Consent Given By: the patient  Position: PRONE   Additional Comments: Vital signs were monitored before and after the  procedure. Patient was prepped and draped in the usual sterile fashion. The correct patient, procedure, and site was verified.   Injection Procedure Details:  Procedure Site One Meds Administered:  Meds ordered this encounter  Medications  . methylPREDNISolone acetate (DEPO-MEDROL) injection 80 mg     Laterality: Bilateral  Location/Site:  L5-S1  Needle size: 22 guage  Needle type: Spinal  Needle Placement: Articular  Findings:  -Comments: Excellent flow of contrast producing a partial arthrogram.  Procedure Details: The fluoroscope beam is vertically oriented in AP, and the inferior recess is visualized beneath the lower pole of the inferior apophyseal process, which represents the target point for needle insertion. When direct visualization is difficult the target point is located at the medial projection of the vertebral pedicle. The region overlying each aforementioned target is locally anesthetized with a 1 to 2 ml. volume of 1% Lidocaine without Epinephrine.   The spinal needle was inserted into each of the above mentioned facet joints using biplanar fluoroscopic guidance. A 0.25 to 0.5 ml. volume of Isovue-250 was injected and a partial facet joint arthrogram was obtained. A single spot film was obtained of the resulting arthrogram.    One to 1.25 ml of the steroid/anesthetic solution was then injected into each of the facet joints noted above.   Additional Comments:  The patient tolerated the procedure well Dressing: 2 x 2 sterile gauze and Band-Aid    Post-procedure details: Patient was observed during the procedure. Post-procedure instructions were reviewed.  Patient left the clinic in  stable condition.     Clinical History: MRI LUMBAR SPINE WITHOUT CONTRAST  TECHNIQUE: Multiplanar, multisequence MR imaging of the lumbar spine was performed. No intravenous contrast was administered.  COMPARISON:  X-ray 01/12/2020  FINDINGS: Segmentation:   Standard.  Alignment:  3 mm grade 1 anterolisthesis L5 on S1.  Vertebrae: No fracture, evidence of discitis, or bone lesion. Multilevel anterior endplate spurring. Mild diffuse intrinsic canal narrowing on the basis of congenitally short pedicles.  Conus medullaris and cauda equina: Conus extends to the L1 level. Conus and cauda equina appear normal.  Paraspinal and other soft tissues: None.  Disc levels:  T12-L1: No significant disc protrusion, foraminal stenosis, or canal stenosis.  L1-L2: Mild circumferential disc bulge and congenitally short pedicles contribute to mild canal stenosis. No significant foraminal stenosis.  L2-L3: Moderate diffuse disc bulge, bilateral facet hypertrophy and ligamentum flavum buckling with congenitally short pedicles result in moderate canal stenosis with mild right foraminal stenosis.  L3-L4: Mild diffuse disc bulge with mild bilateral facet hypertrophy and ligamentum flavum buckling as well as congenitally short pedicles resulting in mild-to-moderate canal stenosis with mild bilateral foraminal stenosis, right worse than left.  L4-L5: Diffuse disc bulge with bilateral facet arthropathy and ligamentum flavum buckling along with congenitally short pedicles contribute to moderate to severe canal stenosis with bilateral subarticular recess stenosis and moderate to severe bilateral foraminal stenosis.  L5-S1: Anterolisthesis with disc uncovering in mild diffuse disc bulge. Severe bilateral facet arthropathy. There is a prominent facet synovial cyst located anterior to the left L5-S1 facet joint measuring 11 x 6 x 12 mm (series 13, image 33) effacing the left subarticular recess and impinging the descending left S1 nerve root. There is moderate canal stenosis with mild left worse than right foraminal stenosis.  IMPRESSION: 1. Multilevel lumbar spondylosis superimposed on a congenitally narrowed canal. Findings are most pronounced  at L4-L5 where there is moderate-to-severe canal stenosis, bilateral subarticular recess stenosis, and moderate-to-severe bilateral foraminal stenosis. 2. Advanced facet arthropathy at L5-S1 with a 11 x 6 mm facet synovial cyst located anterior to the left L5-S1 facet joint effacing the left subarticular recess and impinging the descending left S1 nerve root. Moderate canal stenosis at this level with mild left worse than right foraminal stenosis.   Electronically Signed   By: Duanne Guess D.O.   On: 01/18/2020 12:39 ------  MRI PELVIS WITHOUT CONTRAST  IMPRESSION: 1. Severe bilateral facet hypertrophy at L5-S1 with surrounding soft tissue edema and small synovial cysts. This facet disease may contribute to the patient's symptoms. Consider lumbar MRI for further evaluation. 2. No sacral insufficiency fracture or evidence of sacroiliitis.   Electronically Signed By: Carey Bullocks M.D. On: 10/01/2019 13:03     Objective:  VS:  HT:    WT:   BMI:     BP:(!) 159/71  HR:70bpm  TEMP: ( )  RESP:  Physical Exam Constitutional:      General: She is not in acute distress.    Appearance: Normal appearance. She is not ill-appearing.  HENT:     Head: Normocephalic and atraumatic.     Right Ear: External ear normal.     Left Ear: External ear normal.  Eyes:     Extraocular Movements: Extraocular movements intact.  Cardiovascular:     Rate and Rhythm: Normal rate.     Pulses: Normal pulses.  Musculoskeletal:     Right lower leg: No edema.     Left lower leg: No edema.     Comments: Patient  has good distal strength with no pain over the greater trochanters.  No clonus or focal weakness.  She does have pain with facet loading across the lower back.  She does have pain over the left PSIS.  Skin:    Findings: No erythema, lesion or rash.  Neurological:     General: No focal deficit present.     Mental Status: She is alert and oriented to person, place, and time.      Sensory: No sensory deficit.     Motor: No weakness or abnormal muscle tone.     Coordination: Coordination normal.  Psychiatric:        Mood and Affect: Mood normal.        Behavior: Behavior normal.      Imaging: No results found.

## 2020-01-25 NOTE — Procedures (Signed)
Lumbar Facet Joint Intra-Articular Injection(s) with Fluoroscopic Guidance  Patient: Brittany Harvey      Date of Birth: [birth year] MRN: [id number] PCP: Mayer Masker, PA-C      Visit Date: 01/03/2020   Universal Protocol:    Date/Time: 01/03/2020  Consent Given By: the patient  Position: PRONE   Additional Comments: Vital signs were monitored before and after the procedure. Patient was prepped and draped in the usual sterile fashion. The correct patient, procedure, and site was verified.   Injection Procedure Details:  Procedure Site One Meds Administered:  Meds ordered this encounter  Medications  . methylPREDNISolone acetate (DEPO-MEDROL) injection 80 mg     Laterality: Bilateral  Location/Site:  L5-S1  Needle size: 22 guage  Needle type: Spinal  Needle Placement: Articular  Findings:  -Comments: Excellent flow of contrast producing a partial arthrogram.  Procedure Details: The fluoroscope beam is vertically oriented in AP, and the inferior recess is visualized beneath the lower pole of the inferior apophyseal process, which represents the target point for needle insertion. When direct visualization is difficult the target point is located at the medial projection of the vertebral pedicle. The region overlying each aforementioned target is locally anesthetized with a 1 to 2 ml. volume of 1% Lidocaine without Epinephrine.   The spinal needle was inserted into each of the above mentioned facet joints using biplanar fluoroscopic guidance. A 0.25 to 0.5 ml. volume of Isovue-250 was injected and a partial facet joint arthrogram was obtained. A single spot film was obtained of the resulting arthrogram.    One to 1.25 ml of the steroid/anesthetic solution was then injected into each of the facet joints noted above.   Additional Comments:  The patient tolerated the procedure well Dressing: 2 x 2 sterile gauze and Band-Aid    Post-procedure details: Patient was observed  during the procedure. Post-procedure instructions were reviewed.  Patient left the clinic in stable condition.

## 2020-01-25 NOTE — Telephone Encounter (Signed)
Please advise 

## 2020-01-28 ENCOUNTER — Encounter: Payer: Self-pay | Admitting: Surgical

## 2020-01-28 NOTE — Progress Notes (Signed)
Office Visit Note   Patient: Brittany Harvey           Date of Birth: 18-Feb-1945           MRN: [id number] Visit Date: 01/24/2020 Requested by: Mayer Masker, PA-C [address] [place],  [place] [address] PCP: Mayer Masker, PA-C  Subjective: Chief Complaint  Patient presents with  . Other     Scan review    HPI: Brittany Harvey is a 75 y.o. female who presents to the office complaining of low back pain and left radicular leg pain.  Patient notes continued symptoms that are very slightly improved compared with the last office visit.  She does report continued subjective weakness of the left leg though she no longer has to use a wheelchair.  Pain is waking her up at night.  She has pain that travels down the entirety of her left leg into her foot.  She is here today to review MRI lumbar spine.  Denies any bowel/bladder incontinence or saddle anesthesia..                ROS: All systems reviewed are negative as they relate to the chief complaint within the history of present illness.  Patient denies fevers or chills.  Assessment & Plan: Visit Diagnoses:  1. Spinal stenosis of lumbar region, unspecified whether neurogenic claudication present     Plan: Patient is a 75 year old female presents for evaluation of low back pain with left leg radicular pain.  She is here to review MRI scan of the lumbar spine.  MRI revealed multilevel lumbar spondylosis with moderate to severe canal stenosis and moderate to severe bilateral foraminal stenosis at L4-L5 primarily.  She has advanced facet arthropathy at L5-S1 with a 11 x 6 mm synovial cyst in the left subarticular recess that is impinging the descending left S1 nerve root as well as moderate canal stenosis at L5-S1.  Reviewed these findings with her and went over the imaging.  She does have very pronounced stenosis which may require surgery but she wants to avoid this at all cost.  Plan to refer patient to Dr. Alvester Morin for lumbar spine ESI  with possible aspiration of synovial cyst.  She understands that this may not provide lasting relief and she may need to pursue discussion with a spine surgeon or a neurosurgeon.  Plan to follow-up for 6 weeks after ESI to evaluate the extent of relief that she received.  No significant weakness on exam today.  Follow-Up Instructions: No follow-ups on file.   Orders:  No orders of the defined types were placed in this encounter.  No orders of the defined types were placed in this encounter.     Procedures: No procedures performed   Clinical Data: No additional findings.  Objective: Vital Signs: There were no vitals taken for this visit.  Physical Exam:  Constitutional: Patient appears well-developed HEENT:  Head: Normocephalic Eyes:EOM are normal Neck: Normal range of motion Cardiovascular: Normal rate Pulmonary/chest: Effort normal Neurologic: Patient is alert Skin: Skin is warm Psychiatric: Patient has normal mood and affect  Ortho Exam: Ortho exam demonstrates 5 -/5 strength of left hip flexor, quadricep, hamstring, dorsiflexion, plantar flexion.  +5 motor strength of right hip flexor, quadricep, hamstring, dorsiflexion, plantarflexion.  Sensation intact through all dermatomes of the bilateral lower extremities with very slight decrease in sensation in the left medial/lateral calf.  No clonus present on exam bilaterally.  Tenderness throughout the lumbar spine, most pronounced  at around L4-L5.  No significant pain with hip range of motion.  No knee effusion is present.  Specialty Comments:  No specialty comments available.  Imaging: No results found.   PMFS History: Patient Active Problem List   Diagnosis Date Noted  . Statin myopathy 10/10/2019  . High risk medications (not anticoagulants) long-term use 02/27/2019  . Arthritis 11/15/2018  . Osteoporosis 11/15/2018  . Family history of stroke- daughter age 40 09/22/2018  . History of nephrolithiasis 07/22/2018  .  Statin declined 03/16/2018  . White coat syndrome with hypertension 03/15/2017  . Vitamin D deficiency 11/10/2016  . Stressful job 08/12/2016  . Tiredness 08/12/2016  . Nonintractable headache 08/12/2016  . Other acute fatigue 08/12/2016  . Benign positional vertigo, bilateral 06/10/2016  . Environmental and seasonal allergies 06/10/2016  . Upper back strain 05/07/2016  . Cough 03/06/2016  . Prediabetes 09/24/2015  . Overweight (BMI 25.0-29.9) 09/24/2015  . Elevated HDL = 90 09/24/2015  . Fatigue 09/24/2015  . Osteopenia by Dexa (8-9 yrs ago)  08/20/2015  . Hyperlipidemia 08/20/2015  . Hypertensive disorder 07/24/2014  . GERD (gastroesophageal reflux disease) 11/27/2013  . Chest pain 04/25/2013  . PSVT (paroxysmal supraventricular tachycardia) ([place]) 03/27/2013   Past Medical History:  Diagnosis Date  . Anxiety   . Atypical chest pain   . GERD (gastroesophageal reflux disease)   . Heart palpitations   . Hypertension   . Kidney stones   . PSVT (paroxysmal supraventricular tachycardia) ([place])     Family History  Adopted: Yes    Past Surgical History:  Procedure Laterality Date  . CARDIOVASCULAR STRESS TEST  06/19/2009   Post stress myocardial perfusion images show a normal pattern of perfusion in all regions. ECG positive for ischemia. Appears to be a "false-positive" ECG stress test.  . TUBAL LIGATION     Social History   Occupational History  . Not on file  Tobacco Use  . Smoking status: Former Smoker    Quit date: 01/27/1992    Years since quitting: 28.0  . Smokeless tobacco: Never Used  Vaping Use  . Vaping Use: Never used  Substance and Sexual Activity  . Alcohol use: No  . Drug use: No  . Sexual activity: Never    Birth control/protection: None

## 2020-01-30 ENCOUNTER — Ambulatory Visit: Payer: Medicare Other | Admitting: Orthopaedic Surgery

## 2020-01-31 ENCOUNTER — Telehealth: Payer: Self-pay | Admitting: Orthopedic Surgery

## 2020-01-31 NOTE — Telephone Encounter (Signed)
Pt submitted second set of short term forms sent from employer insurance company along with medical release form and $25.00 check payment. Accepted 01/31/2020.

## 2020-02-19 ENCOUNTER — Ambulatory Visit: Payer: Self-pay

## 2020-02-19 ENCOUNTER — Encounter: Payer: Self-pay | Admitting: Physical Medicine and Rehabilitation

## 2020-02-19 ENCOUNTER — Ambulatory Visit (INDEPENDENT_AMBULATORY_CARE_PROVIDER_SITE_OTHER): Payer: Medicare Other | Admitting: Physical Medicine and Rehabilitation

## 2020-02-19 ENCOUNTER — Other Ambulatory Visit: Payer: Self-pay

## 2020-02-19 VITALS — BP 158/75 | HR 75

## 2020-02-19 DIAGNOSIS — M5416 Radiculopathy, lumbar region: Secondary | ICD-10-CM | POA: Diagnosis not present

## 2020-02-19 MED ORDER — BETAMETHASONE SOD PHOS & ACET 6 (3-3) MG/ML IJ SUSP
12.0000 mg | Freq: Once | INTRAMUSCULAR | Status: AC
  Administered 2020-02-19: 12 mg

## 2020-02-19 NOTE — Patient Instructions (Signed)

## 2020-02-19 NOTE — Progress Notes (Signed)
Pt state lower back pain mostly her left buttock side pain. Pt state walking and standing makes the pain worse. Pt state she take over the counter meds to helps you ease the pain. Pt has hx of inj on 01/03/20 pt state it took a while for it to worked but it helped a little.  Numeric Pain Rating Scale and Functional Assessment Average Pain 5   In the last MONTH (on 0-10 scale) has pain interfered with the following?  1. General activity like being  able to carry out your everyday physical activities such as walking, climbing stairs, carrying groceries, or moving a chair?  Rating(5)   +Driver, -BT, -Dye Allergies.

## 2020-02-21 ENCOUNTER — Telehealth: Payer: Self-pay | Admitting: Orthopedic Surgery

## 2020-02-21 NOTE — Telephone Encounter (Signed)
Patient called asked if can get a note to return to work 02/27/2020? Patient asked if the note could be mailed to her? The number to contact patient is [phone]

## 2020-02-21 NOTE — Telephone Encounter (Signed)
Yeah okay for this

## 2020-02-21 NOTE — Telephone Encounter (Signed)
Ok for this? 

## 2020-02-22 NOTE — Telephone Encounter (Signed)
Mailed to pt

## 2020-03-05 ENCOUNTER — Telehealth: Payer: Self-pay | Admitting: Physician Assistant

## 2020-03-05 NOTE — Telephone Encounter (Signed)
Patient states that she has back pain and frequent urination. Patient advised we could do a NV to check urine and to call Ortho for back pain. Patient verbalized understanding. Call transferred to front desk for scheduling. AS, CMA

## 2020-03-05 NOTE — Telephone Encounter (Signed)
Called patient who says she is busy at the moment and can not talk. Will call us back when shes available. AS, CMA

## 2020-03-06 ENCOUNTER — Other Ambulatory Visit (INDEPENDENT_AMBULATORY_CARE_PROVIDER_SITE_OTHER): Payer: Medicare Other

## 2020-03-06 ENCOUNTER — Other Ambulatory Visit: Payer: Self-pay

## 2020-03-06 DIAGNOSIS — R350 Frequency of micturition: Secondary | ICD-10-CM | POA: Diagnosis not present

## 2020-03-07 DIAGNOSIS — R350 Frequency of micturition: Secondary | ICD-10-CM | POA: Diagnosis not present

## 2020-03-07 LAB — POCT URINALYSIS DIPSTICK
Bilirubin, UA: NEGATIVE
Blood, UA: NEGATIVE
Glucose, UA: NEGATIVE
Ketones, UA: NEGATIVE
Nitrite, UA: NEGATIVE
Protein, UA: NEGATIVE
Spec Grav, UA: 1.02 (ref 1.010–1.025)
Urobilinogen, UA: 0.2 E.U./dL
pH, UA: 7.5 (ref 5.0–8.0)

## 2020-03-09 LAB — URINE CULTURE

## 2020-04-03 NOTE — Progress Notes (Signed)
LISSA ROWLES - 75 y.o. female MRN [id number]  Date of birth: 1945-07-23  Office Visit Note: Visit Date: 02/19/2020 PCP: Lorrene Reid, PA-C Referred by: Lorrene Reid, PA-C  Subjective: Chief Complaint  Patient presents with  . Lower Back - Pain   HPI:  CEONNA FRAZZINI is a 75 y.o. female who comes in today at the request of Annie Main, PA-C for planned Left S1-2 Lumbar epidural steroid injection with fluoroscopic guidance.  The patient has failed conservative care including home exercise, medications, time and activity modification.  This injection will be diagnostic and hopefully therapeutic.  Please see requesting physician notes for further details and justification.  ROS Otherwise per HPI.  Assessment & Plan: Visit Diagnoses:    ICD-10-CM   1. Lumbar radiculopathy  M54.16 XR C-ARM NO REPORT    Epidural Steroid injection    betamethasone acetate-betamethasone sodium phosphate (CELESTONE) injection 12 mg    Plan: No additional findings.   Meds & Orders:  Meds ordered this encounter  Medications  . betamethasone acetate-betamethasone sodium phosphate (CELESTONE) injection 12 mg    Orders Placed This Encounter  Procedures  . XR C-ARM NO REPORT  . Epidural Steroid injection    Follow-up: Return for [facility], PA-C, visit to requesting physician as needed.   Procedures: No procedures performed  S1 Lumbosacral Transforaminal Epidural Steroid Injection - Sub-Pedicular Approach with Fluoroscopic Guidance   Patient: BRYLI MANTEY      Date of Birth: 07/04/1945 MRN: [id number] PCP: Lorrene Reid, PA-C      Visit Date: 02/19/2020   Universal Protocol:    Date/Time: 03/09/225:35 AM  Consent Given By: the patient  Position:  PRONE  Additional Comments: Vital signs were monitored before and after the procedure. Patient was prepped and draped in the usual sterile fashion. The correct patient, procedure, and site was verified.   Injection Procedure Details:   Procedure Site One Meds Administered:  Meds ordered this encounter  Medications  . betamethasone acetate-betamethasone sodium phosphate (CELESTONE) injection 12 mg    Laterality: Left  Location/Site:  S1 Foramen   Needle size: 22 ga.  Needle type: Spinal  Needle Placement: Transforaminal  Findings:   -Comments: Excellent flow of contrast along the nerve, nerve root and into the epidural space.  Epidurogram: Contrast epidurogram showed no nerve root cut off or restricted flow pattern.  Procedure Details: After squaring off the sacral end-plate to get a true AP view, the C-arm was positioned so that the best possible view of the S1 foramen was visualized. The soft tissues overlying this structure were infiltrated with 2-3 ml. of 1% Lidocaine without Epinephrine.    The spinal needle was inserted toward the target using a "trajectory" view along the fluoroscope beam.  Under AP and lateral visualization, the needle was advanced so it did not puncture dura. Biplanar projections were used to confirm position. Aspiration was confirmed to be negative for CSF and/or blood. A 1-2 ml. volume of Isovue-250 was injected and flow of contrast was noted at each level. Radiographs were obtained for documentation purposes.   After attaining the desired flow of contrast documented above, a 0.5 to 1.0 ml test dose of 0.25% Marcaine was injected into each respective transforaminal space.  The patient was observed for 90 seconds post injection.  After no sensory deficits were reported, and normal lower extremity motor function was noted,   the above injectate was administered so that equal amounts of the injectate were placed at each foramen (level)  into the transforaminal epidural space.   Additional Comments:  The patient tolerated the procedure well Dressing: Band-Aid with 2 x 2 sterile gauze    Post-procedure details: Patient was observed during the procedure. Post-procedure instructions were  reviewed.  Patient left the clinic in stable condition.     Clinical History: MRI LUMBAR SPINE WITHOUT CONTRAST  TECHNIQUE: Multiplanar, multisequence MR imaging of the lumbar spine was performed. No intravenous contrast was administered.  COMPARISON:  X-ray 01/12/2020  FINDINGS: Segmentation:  Standard.  Alignment:  3 mm grade 1 anterolisthesis L5 on S1.  Vertebrae: No fracture, evidence of discitis, or bone lesion. Multilevel anterior endplate spurring. Mild diffuse intrinsic canal narrowing on the basis of congenitally short pedicles.  Conus medullaris and cauda equina: Conus extends to the L1 level. Conus and cauda equina appear normal.  Paraspinal and other soft tissues: None.  Disc levels:  T12-L1: No significant disc protrusion, foraminal stenosis, or canal stenosis.  L1-L2: Mild circumferential disc bulge and congenitally short pedicles contribute to mild canal stenosis. No significant foraminal stenosis.  L2-L3: Moderate diffuse disc bulge, bilateral facet hypertrophy and ligamentum flavum buckling with congenitally short pedicles result in moderate canal stenosis with mild right foraminal stenosis.  L3-L4: Mild diffuse disc bulge with mild bilateral facet hypertrophy and ligamentum flavum buckling as well as congenitally short pedicles resulting in mild-to-moderate canal stenosis with mild bilateral foraminal stenosis, right worse than left.  L4-L5: Diffuse disc bulge with bilateral facet arthropathy and ligamentum flavum buckling along with congenitally short pedicles contribute to moderate to severe canal stenosis with bilateral subarticular recess stenosis and moderate to severe bilateral foraminal stenosis.  L5-S1: Anterolisthesis with disc uncovering in mild diffuse disc bulge. Severe bilateral facet arthropathy. There is a prominent facet synovial cyst located anterior to the left L5-S1 facet joint measuring 11 x 6 x 12 mm (series  13, image 33) effacing the left subarticular recess and impinging the descending left S1 nerve root. There is moderate canal stenosis with mild left worse than right foraminal stenosis.  IMPRESSION: 1. Multilevel lumbar spondylosis superimposed on a congenitally narrowed canal. Findings are most pronounced at L4-L5 where there is moderate-to-severe canal stenosis, bilateral subarticular recess stenosis, and moderate-to-severe bilateral foraminal stenosis. 2. Advanced facet arthropathy at L5-S1 with a 11 x 6 mm facet synovial cyst located anterior to the left L5-S1 facet joint effacing the left subarticular recess and impinging the descending left S1 nerve root. Moderate canal stenosis at this level with mild left worse than right foraminal stenosis.   Electronically Signed   By: Davina Poke D.O.   On: 01/18/2020 12:39 ------  MRI PELVIS WITHOUT CONTRAST  IMPRESSION: 1. Severe bilateral facet hypertrophy at L5-S1 with surrounding soft tissue edema and small synovial cysts. This facet disease may contribute to the patient's symptoms. Consider lumbar MRI for further evaluation. 2. No sacral insufficiency fracture or evidence of sacroiliitis.   Electronically Signed By: Richardean Sale M.D. On: 10/01/2019 13:03     Objective:  VS:  HT:    WT:   BMI:     BP:(!) 158/75  HR:75bpm  TEMP: ( )  RESP:  Physical Exam Vitals and nursing note reviewed.  Constitutional:      General: She is not in acute distress.    Appearance: Normal appearance. She is not ill-appearing.  HENT:     Head: Normocephalic and atraumatic.     Right Ear: External ear normal.     Left Ear: External ear normal.  Eyes:  Extraocular Movements: Extraocular movements intact.  Cardiovascular:     Rate and Rhythm: Normal rate.     Pulses: Normal pulses.  Pulmonary:     Effort: Pulmonary effort is normal. No respiratory distress.  Abdominal:     General: There is no distension.      Palpations: Abdomen is soft.  Musculoskeletal:        General: Tenderness present.     Cervical back: Neck supple.     Right lower leg: No edema.     Left lower leg: No edema.     Comments: Patient has good distal strength with no pain over the greater trochanters.  No clonus or focal weakness.  Equivocally positive left slump test  Skin:    Findings: No erythema, lesion or rash.  Neurological:     General: No focal deficit present.     Mental Status: She is alert and oriented to person, place, and time.     Sensory: No sensory deficit.     Motor: No weakness or abnormal muscle tone.     Coordination: Coordination normal.  Psychiatric:        Mood and Affect: Mood normal.        Behavior: Behavior normal.      Imaging: No results found.

## 2020-04-03 NOTE — Procedures (Signed)
S1 Lumbosacral Transforaminal Epidural Steroid Injection - Sub-Pedicular Approach with Fluoroscopic Guidance   Patient: Brittany Harvey      Date of Birth: [birth year] MRN: [id number] PCP: Lorrene Reid, PA-C      Visit Date: 02/19/2020   Universal Protocol:    Date/Time: 03/09/225:35 AM  Consent Given By: the patient  Position:  PRONE  Additional Comments: Vital signs were monitored before and after the procedure. Patient was prepped and draped in the usual sterile fashion. The correct patient, procedure, and site was verified.   Injection Procedure Details:  Procedure Site One Meds Administered:  Meds ordered this encounter  Medications  . betamethasone acetate-betamethasone sodium phosphate (CELESTONE) injection 12 mg    Laterality: Left  Location/Site:  S1 Foramen   Needle size: 22 ga.  Needle type: Spinal  Needle Placement: Transforaminal  Findings:   -Comments: Excellent flow of contrast along the nerve, nerve root and into the epidural space.  Epidurogram: Contrast epidurogram showed no nerve root cut off or restricted flow pattern.  Procedure Details: After squaring off the sacral end-plate to get a true AP view, the C-arm was positioned so that the best possible view of the S1 foramen was visualized. The soft tissues overlying this structure were infiltrated with 2-3 ml. of 1% Lidocaine without Epinephrine.    The spinal needle was inserted toward the target using a "trajectory" view along the fluoroscope beam.  Under AP and lateral visualization, the needle was advanced so it did not puncture dura. Biplanar projections were used to confirm position. Aspiration was confirmed to be negative for CSF and/or blood. A 1-2 ml. volume of Isovue-250 was injected and flow of contrast was noted at each level. Radiographs were obtained for documentation purposes.   After attaining the desired flow of contrast documented above, a 0.5 to 1.0 ml test dose of 0.25% Marcaine  was injected into each respective transforaminal space.  The patient was observed for 90 seconds post injection.  After no sensory deficits were reported, and normal lower extremity motor function was noted,   the above injectate was administered so that equal amounts of the injectate were placed at each foramen (level) into the transforaminal epidural space.   Additional Comments:  The patient tolerated the procedure well Dressing: Band-Aid with 2 x 2 sterile gauze    Post-procedure details: Patient was observed during the procedure. Post-procedure instructions were reviewed.  Patient left the clinic in stable condition.

## 2020-05-01 ENCOUNTER — Ambulatory Visit: Payer: Medicare Other | Admitting: Orthopedic Surgery

## 2020-05-07 ENCOUNTER — Ambulatory Visit (INDEPENDENT_AMBULATORY_CARE_PROVIDER_SITE_OTHER): Payer: Medicare Other | Admitting: Physician Assistant

## 2020-05-07 ENCOUNTER — Encounter: Payer: Self-pay | Admitting: Physician Assistant

## 2020-05-07 ENCOUNTER — Other Ambulatory Visit: Payer: Self-pay

## 2020-05-07 VITALS — BP 126/73 | HR 77 | Temp 97.6°F | Ht 61.0 in | Wt 146.1 lb

## 2020-05-07 DIAGNOSIS — I471 Supraventricular tachycardia: Secondary | ICD-10-CM

## 2020-05-07 DIAGNOSIS — R1013 Epigastric pain: Secondary | ICD-10-CM | POA: Insufficient documentation

## 2020-05-07 DIAGNOSIS — Z860100 Personal history of colon polyps, unspecified: Secondary | ICD-10-CM | POA: Insufficient documentation

## 2020-05-07 DIAGNOSIS — E785 Hyperlipidemia, unspecified: Secondary | ICD-10-CM

## 2020-05-07 DIAGNOSIS — R7303 Prediabetes: Secondary | ICD-10-CM | POA: Diagnosis not present

## 2020-05-07 DIAGNOSIS — K5900 Constipation, unspecified: Secondary | ICD-10-CM | POA: Insufficient documentation

## 2020-05-07 DIAGNOSIS — K5730 Diverticulosis of large intestine without perforation or abscess without bleeding: Secondary | ICD-10-CM | POA: Insufficient documentation

## 2020-05-07 DIAGNOSIS — I10 Essential (primary) hypertension: Secondary | ICD-10-CM

## 2020-05-07 DIAGNOSIS — R634 Abnormal weight loss: Secondary | ICD-10-CM | POA: Insufficient documentation

## 2020-05-07 NOTE — Patient Instructions (Signed)

## 2020-05-07 NOTE — Progress Notes (Signed)
Established Patient Office Visit  Subjective:  Patient ID: Brittany Harvey, female    DOB: 1945-10-10  Age: 75 y.o. MRN: [id number]  CC:  Chief Complaint  Patient presents with  . Prediabetes  . Hyperlipidemia    HPI [name] presents for follow up on prediabetes and hyperlipidemia.  Patient has no acute concerns today.  Prediabetes: Denies increased thirst or urination.  Patient reports he is trying to reduce carbohydrates.  Does not eat a lot of sweets.  HLD: Pt has history of statin intolerance.  Reports has worked on reducing fried foods.  And staying active with walking her dog and doing stretches.  HTN: Pt denies chest pain, palpitations, dizziness or lower extremity swelling. Taking medication as directed without side effects. Checks BP at home  and readings range 120s/70s. Pt tries to monitor sodium and stay hydrated.   Past Medical History:  Diagnosis Date  . Anxiety   . Atypical chest pain   . GERD (gastroesophageal reflux disease)   . Heart palpitations   . Hypertension   . Kidney stones   . PSVT (paroxysmal supraventricular tachycardia) ([place])     Past Surgical History:  Procedure Laterality Date  . CARDIOVASCULAR STRESS TEST  06/19/2009   Post stress myocardial perfusion images show a normal pattern of perfusion in all regions. ECG positive for ischemia. Appears to be a "false-positive" ECG stress test.  . TUBAL LIGATION      Family History  Adopted: Yes    Social History   Socioeconomic History  . Marital status: Married    Spouse name: Not on file  . Number of children: Not on file  . Years of education: Not on file  . Highest education level: Not on file  Occupational History  . Not on file  Tobacco Use  . Smoking status: Former Smoker    Quit date: 01/27/1992    Years since quitting: 28.2  . Smokeless tobacco: Never Used  Vaping Use  . Vaping Use: Never used  Substance and Sexual Activity  . Alcohol use: No  . Drug use: No  . Sexual  activity: Never    Birth control/protection: None  Other Topics Concern  . Not on file  Social History Narrative  . Not on file   Social Determinants of Health   Financial Resource Strain: Not on file  Food Insecurity: Not on file  Transportation Needs: Not on file  Physical Activity: Not on file  Stress: Not on file  Social Connections: Not on file  Intimate Partner Violence: Not on file    Outpatient Medications Prior to Visit  Medication Sig Dispense Refill  . Apple Cider Vinegar 500 MG TABS apple cider vinegar    . aspirin EC 81 MG tablet Take 81 mg by mouth daily.    . Calcium Citrate-Vitamin D (CALCIUM CITRATE + D PO) Take 1 tablet by mouth daily.    . Cholecalciferol (VITAMIN D3) 1000 units CAPS Take 5 capsules by mouth daily. Told pt to increase to 5,000 IU per day    . Cranberry 1000 MG CAPS Take 1 capsule by mouth daily.    . fluticasone (FLONASE) 50 MCG/ACT nasal spray Place 1 spray into both nostrils 2 (two) times daily. 16 g 6  . ibuprofen (ADVIL,MOTRIN) 600 MG tablet Take 1 tablet (600 mg total) by mouth every 8 (eight) hours as needed. 30 tablet 0  . metoprolol succinate (TOPROL-XL) 25 MG 24 hr tablet Take 1 tablet (25 mg total)  by mouth daily. 30 tablet 0  . Multiple Vitamin (MULTIVITAMIN WITH MINERALS) TABS tablet Take 1 tablet by mouth daily. Centrum Silver    . triamcinolone cream (KENALOG) 0.1 % Apply 1 application topically daily as needed (itching/eczema). 30 g 3  . acetaminophen-codeine (TYLENOL #3) 300-30 MG tablet Take 1 tablet by mouth every 12 (twelve) hours as needed for moderate pain. (Patient not taking: Reported on 05/07/2020) 30 tablet 0  . gabapentin (NEURONTIN) 100 MG capsule Take 1 capsule (100 mg total) by mouth 3 (three) times daily. (Patient not taking: No sig reported) 90 capsule 0  . methocarbamol (ROBAXIN) 500 MG tablet Take 1 tablet (500 mg total) by mouth every 8 (eight) hours as needed. (Patient not taking: Reported on 05/07/2020) 30 tablet 0   . predniSONE (DELTASONE) 20 MG tablet 3 po once a day for 2 days, then 2 po once a day for 3 days, then 1 po once a day for 3 days (Patient not taking: Reported on 05/07/2020) 15 tablet 0   No facility-administered medications prior to visit.    Allergies  Allergen Reactions  . Clindamycin/Lincomycin Nausea Only  . Penicillins Anaphylaxis and Swelling  . Tussin Dm [Guaifenesin-Dm] Other (See Comments)    Dizziness/ Lightheadness  . Zithromax [Azithromycin] Other (See Comments)    Pre-syncope    ROS Review of Systems A fourteen system review of systems was performed and found to be positive as per HPI.   Objective:    Physical Exam General:  Well Developed, well nourished, appropriate for stated age.  Neuro:  Alert and oriented,  extra-ocular muscles intact  HEENT:  Normocephalic, atraumatic, neck supple Skin:  no gross rash, warm, pink. Cardiac:  RRR, S1 S2 Respiratory:  ECTA B/L and A/P, Not using accessory muscles, speaking in full sentences- unlabored. Vascular:  Ext warm, no cyanosis apprec.; cap RF less 2 sec. Psych:  No HI/SI, judgement and insight good, Euthymic mood. Full Affect.   BP 126/73   Pulse 77   Temp 97.6 F (36.4 C)   Ht '5\' 1"'  (1.549 m)   Wt 146 lb 1.6 oz (66.3 kg)   SpO2 97%   BMI 27.61 kg/m  Wt Readings from Last 3 Encounters:  05/07/20 146 lb 1.6 oz (66.3 kg)  01/03/20 140 lb 12.8 oz (63.9 kg)  12/29/19 142 lb (64.4 kg)     Health Maintenance Due  Topic Date Due  . COVID-19 Vaccine (1) Never done    There are no preventive care reminders to display for this patient.  Lab Results  Component Value Date   TSH 1.350 12/29/2019   Lab Results  Component Value Date   WBC 7.9 12/29/2019   HGB 13.5 12/29/2019   HCT 38.9 12/29/2019   MCV 89 12/29/2019   PLT 301 12/29/2019   Lab Results  Component Value Date   NA 141 12/29/2019   K 4.1 12/29/2019   CO2 21 12/29/2019   GLUCOSE 97 12/29/2019   BUN 15 12/29/2019   CREATININE 0.68  12/29/2019   BILITOT 0.5 12/29/2019   ALKPHOS 79 12/29/2019   AST 22 12/29/2019   ALT 20 12/29/2019   PROT 7.2 12/29/2019   ALBUMIN 4.8 (H) 12/29/2019   CALCIUM 9.7 12/29/2019   ANIONGAP 12 09/09/2014   Lab Results  Component Value Date   CHOL 219 (H) 12/29/2019   Lab Results  Component Value Date   HDL 96 12/29/2019   Lab Results  Component Value Date   LDLCALC 107 (H)  12/29/2019   Lab Results  Component Value Date   TRIG 91 12/29/2019   Lab Results  Component Value Date   CHOLHDL 2.3 12/29/2019   Lab Results  Component Value Date   HGBA1C 5.8 (H) 12/29/2019      Assessment & Plan:   Problem List Items Addressed This Visit      Cardiovascular and Mediastinum   PSVT (paroxysmal supraventricular tachycardia) ([place]) (Chronic)    -Followed by Cardiology.         Other   Hyperlipidemia - Primary (Chronic)    -Last lipid panel: total cholesterol 219, triglycerides 91, HDL 96, LDL 107 -Patient has a history of statin intolerance.  Recommend to continue with reducing saturated and transfats.  Will repeat lipid panel today.  Discussed with patient to consider referral to lipid clinic, patient not interested at this time.      Relevant Orders   Comp Met (CMET)   Lipid Profile   CBC w/Diff   Prediabetes (Chronic)    -Last A1c 5.8, will repeat today.  Asymptomatic. -Recommend to continue to monitor carbohydrates and glucose.       Relevant Orders   HgB A1c   CBC w/Diff    Other Visit Diagnoses    Essential hypertension       Relevant Orders   Comp Met (CMET)   CBC w/Diff     Essential hypertension: -Controlled. -Continue current medication regimen. -Will continue to monitor.  No orders of the defined types were placed in this encounter.   Follow-up: Return for HTN, HLD, PreDM in 4-5 months .    Lorrene Reid, PA-C

## 2020-05-07 NOTE — Assessment & Plan Note (Signed)
-  Last lipid panel: total cholesterol 219, triglycerides 91, HDL 96, LDL 107 -Patient has a history of statin intolerance.  Recommend to continue with reducing saturated and transfats.  Will repeat lipid panel today.  Discussed with patient to consider referral to lipid clinic, patient not interested at this time.

## 2020-05-07 NOTE — Assessment & Plan Note (Signed)
-  Last A1c 5.8, will repeat today.  Asymptomatic. -Recommend to continue to monitor carbohydrates and glucose.

## 2020-05-07 NOTE — Assessment & Plan Note (Addendum)
Followed by Cardiology 

## 2020-05-08 ENCOUNTER — Telehealth: Payer: Self-pay | Admitting: Physician Assistant

## 2020-05-08 LAB — CBC WITH DIFFERENTIAL/PLATELET
Basophils Absolute: 0.1 10*3/uL (ref 0.0–0.2)
Basos: 1 %
EOS (ABSOLUTE): 0.2 10*3/uL (ref 0.0–0.4)
Eos: 2 %
Hematocrit: 39.4 % (ref 34.0–46.6)
Hemoglobin: 13.4 g/dL (ref 11.1–15.9)
Immature Grans (Abs): 0 10*3/uL (ref 0.0–0.1)
Immature Granulocytes: 0 %
Lymphocytes Absolute: 2.4 10*3/uL (ref 0.7–3.1)
Lymphs: 35 %
MCH: 30.9 pg (ref 26.6–33.0)
MCHC: 34 g/dL (ref 31.5–35.7)
MCV: 91 fL (ref 79–97)
Monocytes Absolute: 0.5 10*3/uL (ref 0.1–0.9)
Monocytes: 7 %
Neutrophils Absolute: 3.8 10*3/uL (ref 1.4–7.0)
Neutrophils: 55 %
Platelets: 275 10*3/uL (ref 150–450)
RBC: 4.33 x10E6/uL (ref 3.77–5.28)
RDW: 12.7 % (ref 11.7–15.4)
WBC: 7 10*3/uL (ref 3.4–10.8)

## 2020-05-08 LAB — COMPREHENSIVE METABOLIC PANEL
ALT: 18 IU/L (ref 0–32)
AST: 22 IU/L (ref 0–40)
Albumin/Globulin Ratio: 1.7 (ref 1.2–2.2)
Albumin: 4.3 g/dL (ref 3.7–4.7)
Alkaline Phosphatase: 77 IU/L (ref 44–121)
BUN/Creatinine Ratio: 18 (ref 12–28)
BUN: 13 mg/dL (ref 8–27)
Bilirubin Total: 0.5 mg/dL (ref 0.0–1.2)
CO2: 22 mmol/L (ref 20–29)
Calcium: 9.1 mg/dL (ref 8.7–10.3)
Chloride: 105 mmol/L (ref 96–106)
Creatinine, Ser: 0.73 mg/dL (ref 0.57–1.00)
Globulin, Total: 2.5 g/dL (ref 1.5–4.5)
Glucose: 94 mg/dL (ref 65–99)
Potassium: 4.2 mmol/L (ref 3.5–5.2)
Sodium: 142 mmol/L (ref 134–144)
Total Protein: 6.8 g/dL (ref 6.0–8.5)
eGFR: 87 mL/min/{1.73_m2} (ref >59)

## 2020-05-08 LAB — LIPID PANEL
Chol/HDL Ratio: 2.5 ratio (ref 0.0–4.4)
Cholesterol, Total: 219 mg/dL — ABNORMAL HIGH (ref 100–199)
HDL: 88 mg/dL (ref >39)
LDL Chol Calc (NIH): 118 mg/dL — ABNORMAL HIGH (ref 0–99)
Triglycerides: 73 mg/dL (ref 0–149)
VLDL Cholesterol Cal: 13 mg/dL (ref 5–40)

## 2020-05-08 LAB — HEMOGLOBIN A1C
Est. average glucose Bld gHb Est-mCnc: 114 mg/dL
Hgb A1c MFr Bld: 5.6 % (ref 4.8–5.6)

## 2020-05-08 NOTE — Telephone Encounter (Signed)
Patient is returning your call.  

## 2020-05-08 NOTE — Telephone Encounter (Signed)
Patients labs reviewed with her. See lab results for today. AS, CMA

## 2020-05-15 ENCOUNTER — Other Ambulatory Visit: Payer: Self-pay

## 2020-05-15 ENCOUNTER — Ambulatory Visit (INDEPENDENT_AMBULATORY_CARE_PROVIDER_SITE_OTHER): Payer: Medicare Other | Admitting: Orthopedic Surgery

## 2020-05-15 ENCOUNTER — Encounter: Payer: Self-pay | Admitting: Orthopedic Surgery

## 2020-05-15 DIAGNOSIS — M48061 Spinal stenosis, lumbar region without neurogenic claudication: Secondary | ICD-10-CM | POA: Diagnosis not present

## 2020-05-15 NOTE — Progress Notes (Signed)
Office Visit Note   Patient: Brittany Harvey           Date of Birth: 1946-01-19           MRN: [id number] Visit Date: 05/15/2020 Requested by: Lorrene Reid, PA-C [place],  [place] [address] PCP: Lorrene Reid, PA-C  Subjective: Chief Complaint  Patient presents with  . Follow-up    HPI: Brittany Harvey is a 75 y.o. female who presents to the office for reevaluation of low back pain and left leg radicular pain following lumbar spine ESI by Dr. Ernestina Patches.  She had injection in late January after experiencing continued radicular pain of the left leg with weakness of the left leg that required use of a wheelchair for ambulation at times.  She had pain that was waking her up at night and travel down the entirety of her left leg into her foot.  Following the [facility], she has had complete relief of her symptoms aside from some occasional pain that she is able to control with Tylenol when she experiences it.  Most of her pain is in the left buttocks and she has no recurrent radicular pain down the left leg.  She has been able to return to work and states that her symptoms do not interfere with her ability to work.  She is walking without assistance and overall has no complaints..                ROS: All systems reviewed are negative as they relate to the chief complaint within the history of present illness.  Patient denies fevers or chills.  Assessment & Plan: Visit Diagnoses:  1. Spinal stenosis of lumbar region, unspecified whether neurogenic claudication present     Plan: Patient is a 75 year old female who presents complaining of left leg radicular pain in the past.  She is here following her epidural steroid injection in January 2022.  She has had significant relief of the majority of her symptoms to the point where she has been able to return to work and is now walking without any sort of ambulation assistance.  The occasional pain that she does have is controlled with  Tylenol.  No weakness on exam today.  Recommended she call the office if her symptoms return and another ESI can be tried if this happens.  Overall she is doing very well and plan to follow-up as needed.  Patient agreed with plan.  Follow-Up Instructions: No follow-ups on file.   Orders:  No orders of the defined types were placed in this encounter.  No orders of the defined types were placed in this encounter.     Procedures: No procedures performed   Clinical Data: No additional findings.  Objective: Vital Signs: There were no vitals taken for this visit.  Physical Exam:  Constitutional: Patient appears well-developed HEENT:  Head: Normocephalic Eyes:EOM are normal Neck: Normal range of motion Cardiovascular: Normal rate Pulmonary/chest: Effort normal Neurologic: Patient is alert Skin: Skin is warm Psychiatric: Patient has normal mood and affect  Ortho Exam: Ortho exam demonstrates left leg with 5/5 motor strength of hip flexor, quadricep, hamstring, dorsiflexion, plantarflexion.  Sensation intact all dermatomes of the bilateral lower extremities.  She has some mild tenderness diffusely throughout the left buttock but no tenderness through the axial lumbar spine.  No pain with passive extension/flexion of the lumbar spine.  No clonus noted bilaterally.  Specialty Comments:  No specialty comments available.  Imaging: No results  found.   PMFS History: Patient Active Problem List   Diagnosis Date Noted  . Abnormal weight loss 05/07/2020  . Constipation 05/07/2020  . Diverticulosis of colon 05/07/2020  . Epigastric pain 05/07/2020  . Personal history of colonic polyps 05/07/2020  . Statin myopathy 10/10/2019  . High risk medications (not anticoagulants) long-term use 02/27/2019  . Arthritis 11/15/2018  . Osteoporosis 11/15/2018  . Family history of stroke- daughter age 63 09/22/2018  . History of nephrolithiasis 07/22/2018  . Statin declined 03/16/2018  . White  coat syndrome with hypertension 03/15/2017  . Vitamin D deficiency 11/10/2016  . Stressful job 08/12/2016  . Tiredness 08/12/2016  . Nonintractable headache 08/12/2016  . Other acute fatigue 08/12/2016  . Benign positional vertigo, bilateral 06/10/2016  . Environmental and seasonal allergies 06/10/2016  . Upper back strain 05/07/2016  . Cough 03/06/2016  . Prediabetes 09/24/2015  . Overweight (BMI 25.0-29.9) 09/24/2015  . Elevated HDL = 90 09/24/2015  . Fatigue 09/24/2015  . Osteopenia by Dexa (8-9 yrs ago)  08/20/2015  . Hyperlipidemia 08/20/2015  . Hypertensive disorder 07/24/2014  . GERD (gastroesophageal reflux disease) 11/27/2013  . Chest pain 04/25/2013  . PSVT (paroxysmal supraventricular tachycardia) ([place]) 03/27/2013   Past Medical History:  Diagnosis Date  . Anxiety   . Atypical chest pain   . GERD (gastroesophageal reflux disease)   . Heart palpitations   . Hypertension   . Kidney stones   . PSVT (paroxysmal supraventricular tachycardia) ([place])     Family History  Adopted: Yes    Past Surgical History:  Procedure Laterality Date  . CARDIOVASCULAR STRESS TEST  06/19/2009   Post stress myocardial perfusion images show a normal pattern of perfusion in all regions. ECG positive for ischemia. Appears to be a "false-positive" ECG stress test.  . TUBAL LIGATION     Social History   Occupational History  . Not on file  Tobacco Use  . Smoking status: Former Smoker    Quit date: 01/27/1992    Years since quitting: 28.3  . Smokeless tobacco: Never Used  Vaping Use  . Vaping Use: Never used  Substance and Sexual Activity  . Alcohol use: No  . Drug use: No  . Sexual activity: Never    Birth control/protection: None

## 2020-09-09 ENCOUNTER — Encounter: Payer: Self-pay | Admitting: Physician Assistant

## 2020-09-09 ENCOUNTER — Other Ambulatory Visit: Payer: Self-pay

## 2020-09-09 ENCOUNTER — Ambulatory Visit (INDEPENDENT_AMBULATORY_CARE_PROVIDER_SITE_OTHER): Payer: Medicare Other | Admitting: Physician Assistant

## 2020-09-09 VITALS — BP 153/74 | HR 74 | Temp 98.1°F | Ht 61.0 in | Wt 148.4 lb

## 2020-09-09 DIAGNOSIS — E785 Hyperlipidemia, unspecified: Secondary | ICD-10-CM | POA: Diagnosis not present

## 2020-09-09 DIAGNOSIS — R7303 Prediabetes: Secondary | ICD-10-CM

## 2020-09-09 DIAGNOSIS — I471 Supraventricular tachycardia: Secondary | ICD-10-CM | POA: Diagnosis not present

## 2020-09-09 DIAGNOSIS — I10 Essential (primary) hypertension: Secondary | ICD-10-CM

## 2020-09-09 NOTE — Patient Instructions (Signed)
https://www.nhlbi.nih.gov/files/docs/public/heart/dash_brief.pdf">  DASH Eating Plan DASH stands for Dietary Approaches to Stop Hypertension. The DASH eating plan is a healthy eating plan that has been shown to: Reduce high blood pressure (hypertension). Reduce your risk for type 2 diabetes, heart disease, and stroke. Help with weight loss. What are tips for following this plan? Reading food labels Check food labels for the amount of salt (sodium) per serving. Choose foods with less than 5 percent of the Daily Value of sodium. Generally, foods with less than 300 milligrams (mg) of sodium per serving fit into this eating plan. To find whole grains, look for the word "whole" as the first word in the ingredient list. Shopping Buy products labeled as "low-sodium" or "no salt added." Buy fresh foods. Avoid canned foods and pre-made or frozen meals. Cooking Avoid adding salt when cooking. Use salt-free seasonings or herbs instead of table salt or sea salt. Check with your health care provider or pharmacist before using salt substitutes. Do not fry foods. Cook foods using healthy methods such as baking, boiling, grilling, roasting, and broiling instead. Cook with heart-healthy oils, such as olive, canola, avocado, soybean, or sunflower oil. Meal planning  Eat a balanced diet that includes: 4 or more servings of fruits and 4 or more servings of vegetables each day. Try to fill one-half of your plate with fruits and vegetables. 6-8 servings of whole grains each day. Less than 6 oz (170 g) of lean meat, poultry, or fish each day. A 3-oz (85-g) serving of meat is about the same size as a deck of cards. One egg equals 1 oz (28 g). 2-3 servings of low-fat dairy each day. One serving is 1 cup (237 mL). 1 serving of nuts, seeds, or beans 5 times each week. 2-3 servings of heart-healthy fats. Healthy fats called omega-3 fatty acids are found in foods such as walnuts, flaxseeds, fortified milks, and eggs.  These fats are also found in cold-water fish, such as sardines, salmon, and mackerel. Limit how much you eat of: Canned or prepackaged foods. Food that is high in trans fat, such as some fried foods. Food that is high in saturated fat, such as fatty meat. Desserts and other sweets, sugary drinks, and other foods with added sugar. Full-fat dairy products. Do not salt foods before eating. Do not eat more than 4 egg yolks a week. Try to eat at least 2 vegetarian meals a week. Eat more home-cooked food and less restaurant, buffet, and fast food.  Lifestyle When eating at a restaurant, ask that your food be prepared with less salt or no salt, if possible. If you drink alcohol: Limit how much you use to: 0-1 drink a day for women who are not pregnant. 0-2 drinks a day for men. Be aware of how much alcohol is in your drink. In the [place] one drink equals one 12 oz bottle of beer (355 mL), one 5 oz glass of wine (148 mL), or one 1 oz glass of hard liquor (44 mL). General information Avoid eating more than 2,300 mg of salt a day. If you have hypertension, you may need to reduce your sodium intake to 1,500 mg a day. Work with your health care provider to maintain a healthy body weight or to lose weight. Ask what an ideal weight is for you. Get at least 30 minutes of exercise that causes your heart to beat faster (aerobic exercise) most days of the week. Activities may include walking, swimming, or biking. Work with your health care provider   or dietitian to adjust your eating plan to your individual calorie needs. What foods should I eat? Fruits All fresh, dried, or frozen fruit. Canned fruit in natural juice (without addedsugar). Vegetables Fresh or frozen vegetables (raw, steamed, roasted, or grilled). Low-sodium or reduced-sodium tomato and vegetable juice. Low-sodium or reduced-sodium tomatosauce and tomato paste. Low-sodium or reduced-sodium canned vegetables. Grains Whole-grain or  whole-wheat bread. Whole-grain or whole-wheat pasta. Brown rice. Oatmeal. Quinoa. Bulgur. Whole-grain and low-sodium cereals. Pita bread.Low-fat, low-sodium crackers. Whole-wheat flour tortillas. Meats and other proteins Skinless chicken or turkey. Ground chicken or turkey. Pork with fat trimmed off. Fish and seafood. Egg whites. Dried beans, peas, or lentils. Unsalted nuts, nut butters, and seeds. Unsalted canned beans. Lean cuts of beef with fat trimmed off. Low-sodium, lean precooked or cured meat, such as sausages or meatloaves. Dairy Low-fat (1%) or fat-free (skim) milk. Reduced-fat, low-fat, or fat-free cheeses. Nonfat, low-sodium ricotta or cottage cheese. Low-fat or nonfatyogurt. Low-fat, low-sodium cheese. Fats and oils Soft margarine without trans fats. Vegetable oil. Reduced-fat, low-fat, or light mayonnaise and salad dressings (reduced-sodium). Canola, safflower, olive, avocado, soybean, andsunflower oils. Avocado. Seasonings and condiments Herbs. Spices. Seasoning mixes without salt. Other foods Unsalted popcorn and pretzels. Fat-free sweets. The items listed above may not be a complete list of foods and beverages you can eat. Contact a dietitian for more information. What foods should I avoid? Fruits Canned fruit in a light or heavy syrup. Fried fruit. Fruit in cream or buttersauce. Vegetables Creamed or fried vegetables. Vegetables in a cheese sauce. Regular canned vegetables (not low-sodium or reduced-sodium). Regular canned tomato sauce and paste (not low-sodium or reduced-sodium). Regular tomato and vegetable juice(not low-sodium or reduced-sodium). Pickles. Olives. Grains Baked goods made with fat, such as croissants, muffins, or some breads. Drypasta or rice meal packs. Meats and other proteins Fatty cuts of meat. Ribs. Fried meat. Bacon. Bologna, salami, and other precooked or cured meats, such as sausages or meat loaves. Fat from the back of a pig (fatback). Bratwurst.  Salted nuts and seeds. Canned beans with added salt. Canned orsmoked fish. Whole eggs or egg yolks. Chicken or turkey with skin. Dairy Whole or 2% milk, cream, and half-and-half. Whole or full-fat cream cheese. Whole-fat or sweetened yogurt. Full-fat cheese. Nondairy creamers. Whippedtoppings. Processed cheese and cheese spreads. Fats and oils Butter. Stick margarine. Lard. Shortening. Ghee. Bacon fat. Tropical oils, suchas coconut, palm kernel, or palm oil. Seasonings and condiments Onion salt, garlic salt, seasoned salt, table salt, and sea salt. Worcestershire sauce. Tartar sauce. Barbecue sauce. Teriyaki sauce. Soy sauce, including reduced-sodium. Steak sauce. Canned and packaged gravies. Fish sauce. Oyster sauce. Cocktail sauce. Store-bought horseradish. Ketchup. Mustard. Meat flavorings and tenderizers. Bouillon cubes. Hot sauces. Pre-made or packaged marinades. Pre-made or packaged taco seasonings. Relishes. Regular saladdressings. Other foods Salted popcorn and pretzels. The items listed above may not be a complete list of foods and beverages you should avoid. Contact a dietitian for more information. Where to find more information National Heart, Lung, and Blood Institute: www.nhlbi.nih.gov American Heart Association: www.heart.org Academy of Nutrition and Dietetics: www.eatright.org National Kidney Foundation: www.kidney.org Summary The DASH eating plan is a healthy eating plan that has been shown to reduce high blood pressure (hypertension). It may also reduce your risk for type 2 diabetes, heart disease, and stroke. When on the DASH eating plan, aim to eat more fresh fruits and vegetables, whole grains, lean proteins, low-fat dairy, and heart-healthy fats. With the DASH eating plan, you should limit salt (sodium) intake to 2,300   mg a day. If you have hypertension, you may need to reduce your sodium intake to 1,500 mg a day. Work with your health care provider or dietitian to adjust  your eating plan to your individual calorie needs. This information is not intended to replace advice given to you by your health care provider. Make sure you discuss any questions you have with your healthcare provider. Document Revised: 12/16/2018 Document Reviewed: 12/16/2018 Elsevier Patient Education  2022 Elsevier Inc.  

## 2020-09-09 NOTE — Progress Notes (Signed)
Established Patient Office Visit  Subjective:  Patient ID: Brittany Harvey, female    DOB: May 23, 1945  Age: 75 y.o. MRN: [id number]  CC:  Chief Complaint  Patient presents with   Hypertension   Hyperlipidemia    HPI Brittany Harvey presents for follow up on hypertension and hyperlipidemia. Patient has no acute concerns today.  HTN: Pt denies chest pain, palpitations, arrhythmia, dizziness or worsening lower extremity swelling. Taking medication as directed without side effects. Checks BP at home and readings range <135/85. States was rushing to get here. Pt follows a low salt diet.  HLD: Pt trying to manage with diet and lifestyle changes. Diet consists of whole grains and mostly chicken. Tries to have some vegetables. In the past was not able to tolerate rosuvastatin due to side effects and concerned about trying something different.  Prediabetes: Denies increased thirst or urination. Reports continues to work on reducing carbohydrates and snacks such as tortilla chips.   Past Medical History:  Diagnosis Date   Anxiety    Atypical chest pain    GERD (gastroesophageal reflux disease)    Heart palpitations    Hypertension    Kidney stones    PSVT (paroxysmal supraventricular tachycardia) [facility])     Past Surgical History:  Procedure Laterality Date   CARDIOVASCULAR STRESS TEST  06/19/2009   Post stress myocardial perfusion images show a normal pattern of perfusion in all regions. ECG positive for ischemia. Appears to be a "false-positive" ECG stress test.   TUBAL LIGATION      Family History  Adopted: Yes    Social History   Socioeconomic History   Marital status: Married    Spouse name: Not on file   Number of children: Not on file   Years of education: Not on file   Highest education level: Not on file  Occupational History   Not on file  Tobacco Use   Smoking status: Former    Types: Cigarettes    Quit date: 01/27/1992    Years since quitting: 28.6   Smokeless  tobacco: Never  Vaping Use   Vaping Use: Never used  Substance and Sexual Activity   Alcohol use: No   Drug use: No   Sexual activity: Never    Birth control/protection: None  Other Topics Concern   Not on file  Social History Narrative   Not on file   Social Determinants of Health   Financial Resource Strain: Not on file  Food Insecurity: Not on file  Transportation Needs: Not on file  Physical Activity: Not on file  Stress: Not on file  Social Connections: Not on file  Intimate Partner Violence: Not on file    Outpatient Medications Prior to Visit  Medication Sig Dispense Refill   Apple Cider Vinegar 500 MG TABS apple cider vinegar     aspirin EC 81 MG tablet Take 81 mg by mouth daily.     Calcium Citrate-Vitamin D (CALCIUM CITRATE + D PO) Take 1 tablet by mouth daily.     Cholecalciferol (VITAMIN D3) 1000 units CAPS Take 5 capsules by mouth daily. Told pt to increase to 5,000 IU per day     Cranberry 1000 MG CAPS Take 1 capsule by mouth daily.     fluticasone (FLONASE) 50 MCG/ACT nasal spray Place 1 spray into both nostrils 2 (two) times daily. 16 g 6   ibuprofen (ADVIL,MOTRIN) 600 MG tablet Take 1 tablet (600 mg total) by mouth every 8 (eight) hours as needed.  30 tablet 0   metoprolol succinate (TOPROL-XL) 25 MG 24 hr tablet Take 1 tablet (25 mg total) by mouth daily. 30 tablet 0   Multiple Vitamin (MULTIVITAMIN WITH MINERALS) TABS tablet Take 1 tablet by mouth daily. Centrum Silver     triamcinolone cream (KENALOG) 0.1 % Apply 1 application topically daily as needed (itching/eczema). 30 g 3   No facility-administered medications prior to visit.    Allergies  Allergen Reactions   Clindamycin/Lincomycin Nausea Only   Penicillins Anaphylaxis and Swelling   Tussin Dm [Guaifenesin-Dm] Other (See Comments)    Dizziness/ Lightheadness   Zithromax [Azithromycin] Other (See Comments)    Pre-syncope    ROS Review of Systems Review of Systems:  A fourteen system review  of systems was performed and found to be positive as per HPI.   Objective:    Physical Exam General:  Well Developed, well nourished, appropriate for stated age.  Neuro:  Alert and oriented,  extra-ocular muscles intact  HEENT:  Normocephalic, atraumatic, neck supple Skin:  no gross rash, warm, pink. Cardiac:  RRR, S1 S2 Respiratory:  CTA B/L, Not using accessory muscles, speaking in full sentences- unlabored. Vascular:  Ext warm, no cyanosis apprec.; cap RF less 2 sec. No pitting edema. Psych:  No HI/SI, judgement and insight good, Euthymic mood. Full Affect.  BP (!) 153/74   Pulse 74   Temp 98.1 F (36.7 C)   Ht _0  (1.549 m)   Wt 148 lb 6.4 oz (67.3 kg)   SpO2 95%   BMI 28.04 kg/m  Wt Readings from Last 3 Encounters:  09/09/20 148 lb 6.4 oz (67.3 kg)  05/07/20 146 lb 1.6 oz (66.3 kg)  01/03/20 140 lb 12.8 oz (63.9 kg)     Health Maintenance Due  Topic Date Due   Zoster Vaccines- Shingrix (2 of 2) 02/16/2018   COVID-19 Vaccine (3 - Moderna risk series) 04/24/2019   INFLUENZA VACCINE  08/26/2020    There are no preventive care reminders to display for this patient.  Lab Results  Component Value Date   TSH 1.350 12/29/2019   Lab Results  Component Value Date   WBC 7.0 05/07/2020   HGB 13.4 05/07/2020   HCT 39.4 05/07/2020   MCV 91 05/07/2020   PLT 275 05/07/2020   Lab Results  Component Value Date   NA 142 05/07/2020   K 4.2 05/07/2020   CO2 22 05/07/2020   GLUCOSE 94 05/07/2020   BUN 13 05/07/2020   CREATININE 0.73 05/07/2020   BILITOT 0.5 05/07/2020   ALKPHOS 77 05/07/2020   AST 22 05/07/2020   ALT 18 05/07/2020   PROT 6.8 05/07/2020   ALBUMIN 4.3 05/07/2020   CALCIUM 9.1 05/07/2020   ANIONGAP 12 09/09/2014   EGFR 87 05/07/2020   Lab Results  Component Value Date   CHOL 219 (H) 05/07/2020   Lab Results  Component Value Date   HDL 88 05/07/2020   Lab Results  Component Value Date   LDLCALC 118 (H) 05/07/2020   Lab Results  Component  Value Date   TRIG 73 05/07/2020   Lab Results  Component Value Date   CHOLHDL 2.5 05/07/2020   Lab Results  Component Value Date   HGBA1C 5.6 05/07/2020      Assessment & Plan:   Problem List Items Addressed This Visit       Cardiovascular and Mediastinum   PSVT (paroxysmal supraventricular tachycardia) ([place]) (Chronic)     Other   Hyperlipidemia - Primary (  Chronic)   Prediabetes (Chronic)   Other Visit Diagnoses     Essential hypertension          Essential hyperlipidemia: -BP elevated in the office, ambulatory BP stable. BP at last OV 126/73. Recommend to continue with monitoring BP at home and notify the office if consistently >140/90 for treatment adjustments. Continue current medication regimen. -Will continue to monitor.  PSVT: -Followed by Cardiology. -On metoprolol succinate 25 mg.  Hyperlipidemia: -Last lipid panel: total cholesterol 219, triglycerides 73, HDL 88, LDL 118 -Recommend to continue with diet changes and reduce simple carbohydrates and saturated fats. Continue to stay as active as possible. -Advised to schedule lab visit to repeat lipid panel.  Prediabetes: -Last A1c 5.6, will repeat A1c with lab visit. -Will continue to monitor. Monitor/reduce simple carbohydrates and glucose intake.   No orders of the defined types were placed in this encounter.   Follow-up: Return in about 4 months (around 01/09/2021) for [facility]; lab visit in 1-4 weeks for FBW (lipid panel, cmp, A1c).    Lorrene Reid, PA-C

## 2020-10-01 ENCOUNTER — Other Ambulatory Visit: Payer: Self-pay

## 2020-10-01 DIAGNOSIS — R7303 Prediabetes: Secondary | ICD-10-CM

## 2020-10-01 DIAGNOSIS — E7800 Pure hypercholesterolemia, unspecified: Secondary | ICD-10-CM

## 2020-10-01 DIAGNOSIS — I10 Essential (primary) hypertension: Secondary | ICD-10-CM

## 2020-10-02 ENCOUNTER — Other Ambulatory Visit: Payer: Medicare Other

## 2020-10-02 ENCOUNTER — Other Ambulatory Visit: Payer: Self-pay

## 2020-10-02 DIAGNOSIS — I10 Essential (primary) hypertension: Secondary | ICD-10-CM | POA: Diagnosis not present

## 2020-10-02 DIAGNOSIS — R7303 Prediabetes: Secondary | ICD-10-CM

## 2020-10-02 DIAGNOSIS — E7800 Pure hypercholesterolemia, unspecified: Secondary | ICD-10-CM | POA: Diagnosis not present

## 2020-10-03 LAB — COMPREHENSIVE METABOLIC PANEL
ALT: 12 IU/L (ref 0–32)
AST: 20 IU/L (ref 0–40)
Albumin/Globulin Ratio: 1.5 (ref 1.2–2.2)
Albumin: 4.4 g/dL (ref 3.7–4.7)
Alkaline Phosphatase: 77 IU/L (ref 44–121)
BUN/Creatinine Ratio: 20 (ref 12–28)
BUN: 15 mg/dL (ref 8–27)
Bilirubin Total: 0.6 mg/dL (ref 0.0–1.2)
CO2: 22 mmol/L (ref 20–29)
Calcium: 9.4 mg/dL (ref 8.7–10.3)
Chloride: 105 mmol/L (ref 96–106)
Creatinine, Ser: 0.75 mg/dL (ref 0.57–1.00)
Globulin, Total: 2.9 g/dL (ref 1.5–4.5)
Glucose: 86 mg/dL (ref 65–99)
Potassium: 4.2 mmol/L (ref 3.5–5.2)
Sodium: 143 mmol/L (ref 134–144)
Total Protein: 7.3 g/dL (ref 6.0–8.5)
eGFR: 83 mL/min/{1.73_m2} (ref >59)

## 2020-10-03 LAB — LIPID PANEL
Chol/HDL Ratio: 2.6 ratio (ref 0.0–4.4)
Cholesterol, Total: 216 mg/dL — ABNORMAL HIGH (ref 100–199)
HDL: 83 mg/dL (ref >39)
LDL Chol Calc (NIH): 118 mg/dL — ABNORMAL HIGH (ref 0–99)
Triglycerides: 84 mg/dL (ref 0–149)
VLDL Cholesterol Cal: 15 mg/dL (ref 5–40)

## 2020-10-03 LAB — HEMOGLOBIN A1C
Est. average glucose Bld gHb Est-mCnc: 120 mg/dL
Hgb A1c MFr Bld: 5.8 % — ABNORMAL HIGH (ref 4.8–5.6)

## 2020-10-24 NOTE — Progress Notes (Signed)
Cardiology Office Note:    Date:  10/30/2020   ID:  Brittany Harvey, DOB [birth year], MRN [id number]  PCP:  Lorrene Reid, PA-C  Cardiologist:  Quay Burow, MD  Electrophysiologist:  None   Referring MD: Lorrene Reid, PA-C   Chief Complaint: follow-up of atypical chest pain and palpitations  History of Present Illness:    Brittany Harvey is a 75 y.o. female with a history of atypical chest pain with negative Myoview in 10/2017, palpitations with remote history of paroxysmal SVT in 2004, hypertension, hyperlipidemia, prediabetes, GERD, and anxiety who is followed by Dr. Gwenlyn Found and presents today for routine follow-up.   Patient has a long history of atypical chest pain and palpitations. Patient was admitted in 2015 with chest pain. She had a cardiac MRI at that time showed normal biventricular size and function with mild LVH and no wall motion abnormalities. No rest or stress induced perfusion. No evidence of scare. Repeat Echo and Myoview were ordered in 10/2017 for recurrent chest pain/back pain with some associated shortness of breath. Echo showed LVEF of 60-65% with normal wall motion. Myoview was low risk with no evidence of ischemia (although she did have some 23mm horizontal downloading ST depression during infusion in the inferolateral leads).   Patient was last seen by Dr. Gwenlyn Found in 10/2018 at which time she denied any chest pain and was doing well. She has not been seen by Cardiology since that time.  Patient presents today for follow-up.  Here alone.  Patient has done very well since last visit.  She does note some shortness of breath which she thinks is related to wearing a mask.  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  However, she denies any shortness of breath when she is not wearing a mask on the weekends.  No orthopnea, PND, lower extremity edema.  No chest pain.  No palpitations.  She states she will sometimes get diarrhea if she eats certain  foods (greasy foods) and when this happens she will have some mild lightheadedness/dizziness.  However, no dizziness outside of these times.  Her BP is mildly elevated in the office today at 145/68; however, she states she has whitecoat syndrome.  She monitors her BP at home and states her BP is normally in the 110s/70s.   Past Medical History:  Diagnosis Date   Anxiety    Atypical chest pain    GERD (gastroesophageal reflux disease)    Heart palpitations    Hyperlipidemia    Hypertension    Kidney stones    Prediabetes    PSVT (paroxysmal supraventricular tachycardia) [facility])     Past Surgical History:  Procedure Laterality Date   CARDIOVASCULAR STRESS TEST  06/19/2009   Post stress myocardial perfusion images show a normal pattern of perfusion in all regions. ECG positive for ischemia. Appears to be a "false-positive" ECG stress test.   TUBAL LIGATION      Current Medications: Current Meds  Medication Sig   Apple Cider Vinegar 500 MG TABS apple cider vinegar   aspirin EC 81 MG tablet Take 81 mg by mouth daily.   Calcium Citrate-Vitamin D (CALCIUM CITRATE + D PO) Take 1 tablet by mouth daily.   Cholecalciferol (VITAMIN D3) 1000 units CAPS Take 5 capsules by mouth daily. Told pt to increase to 5,000 IU per day   Cranberry 1000 MG CAPS Take 1 capsule by mouth daily.   fluticasone (FLONASE) 50 MCG/ACT nasal spray  Place 1 spray into both nostrils 2 (two) times daily.   ibuprofen (ADVIL,MOTRIN) 600 MG tablet Take 1 tablet (600 mg total) by mouth every 8 (eight) hours as needed.   metoprolol succinate (TOPROL-XL) 25 MG 24 hr tablet Take 1 tablet (25 mg total) by mouth daily.   Multiple Vitamin (MULTIVITAMIN WITH MINERALS) TABS tablet Take 1 tablet by mouth daily. Centrum Silver   triamcinolone cream (KENALOG) 0.1 % Apply 1 application topically daily as needed (itching/eczema).     Allergies:   Clindamycin/lincomycin, Penicillins, Tussin dm [guaifenesin-dm], and Zithromax  [azithromycin]   Social History   Socioeconomic History   Marital status: Married    Spouse name: Not on file   Number of children: Not on file   Years of education: Not on file   Highest education level: Not on file  Occupational History   Not on file  Tobacco Use   Smoking status: Former    Types: Cigarettes    Quit date: 01/27/1992    Years since quitting: 28.7   Smokeless tobacco: Never  Vaping Use   Vaping Use: Never used  Substance and Sexual Activity   Alcohol use: No   Drug use: No   Sexual activity: Never    Birth control/protection: None  Other Topics Concern   Not on file  Social History Narrative   Not on file   Social Determinants of Health   Financial Resource Strain: Not on file  Food Insecurity: Not on file  Transportation Needs: Not on file  Physical Activity: Not on file  Stress: Not on file  Social Connections: Not on file     Family History: The patient's family history is not on file. She was adopted.  ROS:   Please see the history of present illness.     EKGs/Labs/Other Studies Reviewed:    The following studies were reviewed today:  Cardiac MRI 04/26/2013: Impressions: 1. Normal biventricular size and function. Mild concentric LVH. No wall motion abnormalities.  2. No rest or stress induced perfusion defect. No evidence of scar or ischemia by Lexiscan stress test.  3.  No evidence of pericarditis.  4.  Normal cardiac MRI, consider non-cardiac causes of chest pain.  _______________  Echocardiogram 11/09/2017: Study Conclusions: - Left ventricle: The cavity size was normal. There was mild focal basal hypertrophy of the septum. Systolic function was normal. The estimated ejection fraction was in the range of 60% to 65%.  Wall motion was normal; there were no regional wall motion abnormalities.  - Aortic valve: Transvalvular velocity was within the normal range.    There was no stenosis. There was trivial regurgitation.  - Mitral valve:  Transvalvular velocity was within the normal range. There was no evidence for stenosis. There was trivial regurgitation.  - Right ventricle: The cavity size was normal. Wall thickness was normal. Systolic function was normal.  - Atrial septum: No defect or patent foramen ovale was identified by color flow Doppler.  - Tricuspid valve: There was trivial regurgitation.  - Pulmonary arteries: Systolic pressure was within the normal range. PA peak pressure: 22 mm Hg (S).  - Pericardium, extracardiac: A trivial pericardial effusion was identified.  - Global longitudinal strain -21% (normal).  _______________  Myoview 11/16/2017: The left ventricular ejection fraction is hyperdynamic (>65%). Nuclear stress EF: 68%. There was 53mm of horizontal to downsloping ST segment depression during the infusion in the inferolateral leads The study is normal. This is a low risk study.  EKG:  EKG ordered today.  EKG personally reviewed and demonstrates normal sinus rhythm, rate 79 bpm, with no acute ST/T changes. Normal axis. Normal PR and QRS intervals. QTc 415 ms.  Recent Labs: 12/29/2019: TSH 1.350 05/07/2020: Hemoglobin 13.4; Platelets 275 10/02/2020: ALT 12; BUN 15; Creatinine, Ser 0.75; Potassium 4.2; Sodium 143  Recent Lipid Panel    Component Value Date/Time   CHOL 216 (H) 10/02/2020 0818   TRIG 84 10/02/2020 0818   HDL 83 10/02/2020 0818   CHOLHDL 2.6 10/02/2020 0818   CHOLHDL 2.2 04/26/2013 0730   VLDL 13 04/26/2013 0730   LDLCALC 118 (H) 10/02/2020 0818    Physical Exam:    Vital Signs: BP (!) 145/68   Pulse 79   Ht 5' 1.2" (1.554 m)   Wt 146 lb 12.8 oz (66.6 kg)   BMI 27.56 kg/m     Wt Readings from Last 3 Encounters:  10/30/20 146 lb 12.8 oz (66.6 kg)  09/09/20 148 lb 6.4 oz (67.3 kg)  05/07/20 146 lb 1.6 oz (66.3 kg)     General: 75 y.o. female in no acute distress. HEENT: Normocephalic and atraumatic. Sclera clear.  Neck: Supple. No carotid bruits. No JVD. Heart: RRR. Distinct  S1 and S2. No murmurs, gallops, or rubs. Radial pulses 2+ and equal bilaterally. Lungs: No increased work of breathing. Clear to ausculation bilaterally. No wheezes, rhonchi, or rales.  Abdomen: Soft, non-distended, and non-tender to palpation.  MSK: Normal strength and tone for age.  Extremities: No lower extremity edema.    Skin: Warm and dry. Neuro: Alert and oriented x3. No focal deficits. Psych: Normal affect. Responds appropriately.  Assessment:    1. Dyspnea, unspecified type   2. History of chest pain   3. History of palpitations   4. Paroxysmal SVT (supraventricular tachycardia) ([place])   5. Primary hypertension   6. Hyperlipidemia, unspecified hyperlipidemia type   7. Prediabetes     Plan:    Dyspnea - Patient notes shortness of breath but only when she is wearing at mask. - No other CHF symptoms. Euvolemic on exam. - No additional work-up necessary at this time. Patient to notify us if symptoms worsen.  History of Atypical Chest Pain - Long history of atypical chest pain. Stress MRI in 2015 showed no evidence of ischemia or infarct. Last Myoview in 10/2017 was low risk with no evidence of ischemia.  - No recurrent chest pain.  History of Palpitations Paroxysmal SVT - Remote history of paroxysmal SVT. - Well controlled. No recent palpitations. - Continue Toprol-XL 25mg  daily.   Hypertension - BP mildly elevated in the office today at 145/68 but patient states it is always well controlled at home. Normally in the 110s/70s. - Continue Toprol-XL 25mg  daily. - Advised patient to continue to monitor BP at home and notify us if consistently >130/80.  Hyperlipidemia - Recent lipid panel on 10/02/2020: Total Cholesterol 216, Triglycerides 84, HDL 83, LDL 118.  - Intolerant to statins in the past due to myalgias. - PCP recommended diet modification at time of last check and was going to consider Zetia if LDL remains elevated at next check. Agree with this.   Prediabetes -  Hemoglobin 5.8 on 10/02/2020. - Followed by PCP.  Disposition: Follow up in 1 year.   Medication Adjustments/Labs and Tests Ordered: Current medicines are reviewed at length with the patient today.  Concerns regarding medicines are outlined above.  Orders Placed This Encounter  Procedures   EKG 12-Lead    No orders of the defined types were  placed in this encounter.   Patient Instructions  Medication Instructions:  The current medical regimen is effective;  continue present plan and medications.  *If you need a refill on your cardiac medications before your next appointment, please call your pharmacy*   Follow-Up: At [facility], you and your health needs are our priority.  As part of our continuing mission to provide you with exceptional heart care, we have created designated Provider Care Teams.  These Care Teams include your primary Cardiologist (physician) and Advanced Practice Providers (APPs -  Physician Assistants and Nurse Practitioners) who all work together to provide you with the care you need, when you need it.  We recommend signing up for the patient portal called "MyChart".  Sign up information is provided on this After Visit Summary.  MyChart is used to connect with patients for Virtual Visits (Telemedicine).  Patients are able to view lab/test results, encounter notes, upcoming appointments, etc.  Non-urgent messages can be sent to your provider as well.   To learn more about what you can do with MyChart, go to [email or web address].    Your next appointment:   12 month(s)  The format for your next appointment:   In Person  Provider:   Quay Burow, MD or Sande Rives, PA-C     Signed, Darreld Mclean, PA-C  10/30/2020 1:00 PM    [place]

## 2020-10-29 ENCOUNTER — Encounter: Payer: Self-pay | Admitting: Student

## 2020-10-29 ENCOUNTER — Telehealth: Payer: Self-pay | Admitting: Physician Assistant

## 2020-10-29 NOTE — Telephone Encounter (Signed)
Patient called to speak with [employer].  She would like [employer] to call her back tomorrow, 10/30/20 after 4pm.  Patient would not disclose her question to me.

## 2020-10-30 ENCOUNTER — Other Ambulatory Visit: Payer: Self-pay

## 2020-10-30 ENCOUNTER — Ambulatory Visit (INDEPENDENT_AMBULATORY_CARE_PROVIDER_SITE_OTHER): Payer: Medicare Other | Admitting: Student

## 2020-10-30 ENCOUNTER — Encounter: Payer: Self-pay | Admitting: Student

## 2020-10-30 VITALS — BP 145/68 | HR 79 | Ht 61.2 in | Wt 146.8 lb

## 2020-10-30 DIAGNOSIS — I10 Essential (primary) hypertension: Secondary | ICD-10-CM

## 2020-10-30 DIAGNOSIS — R0600 Dyspnea, unspecified: Secondary | ICD-10-CM

## 2020-10-30 DIAGNOSIS — I471 Supraventricular tachycardia: Secondary | ICD-10-CM | POA: Diagnosis not present

## 2020-10-30 DIAGNOSIS — R7303 Prediabetes: Secondary | ICD-10-CM | POA: Diagnosis not present

## 2020-10-30 DIAGNOSIS — E785 Hyperlipidemia, unspecified: Secondary | ICD-10-CM | POA: Diagnosis not present

## 2020-10-30 DIAGNOSIS — Z87898 Personal history of other specified conditions: Secondary | ICD-10-CM | POA: Diagnosis not present

## 2020-10-30 NOTE — Patient Instructions (Signed)
Medication Instructions:  The current medical regimen is effective;  continue present plan and medications.  *If you need a refill on your cardiac medications before your next appointment, please call your pharmacy*   Follow-Up: At [facility], you and your health needs are our priority.  As part of our continuing mission to provide you with exceptional heart care, we have created designated Provider Care Teams.  These Care Teams include your primary Cardiologist (physician) and Advanced Practice Providers (APPs -  Physician Assistants and Nurse Practitioners) who all work together to provide you with the care you need, when you need it.  We recommend signing up for the patient portal called "MyChart".  Sign up information is provided on this After Visit Summary.  MyChart is used to connect with patients for Virtual Visits (Telemedicine).  Patients are able to view lab/test results, encounter notes, upcoming appointments, etc.  Non-urgent messages can be sent to your provider as well.   To learn more about what you can do with MyChart, go to [email or web address].    Your next appointment:   12 month(s)  The format for your next appointment:   In Person  Provider:   Quay Burow, MD or Sande Rives, PA-C

## 2020-11-01 NOTE — Telephone Encounter (Signed)
Left msg for patient to call back. AS, CMA 

## 2020-11-15 DIAGNOSIS — Z23 Encounter for immunization: Secondary | ICD-10-CM | POA: Diagnosis not present

## 2020-12-16 DIAGNOSIS — Z1231 Encounter for screening mammogram for malignant neoplasm of breast: Secondary | ICD-10-CM | POA: Diagnosis not present

## 2020-12-16 DIAGNOSIS — Z01419 Encounter for gynecological examination (general) (routine) without abnormal findings: Secondary | ICD-10-CM | POA: Diagnosis not present

## 2020-12-16 DIAGNOSIS — R1031 Right lower quadrant pain: Secondary | ICD-10-CM | POA: Diagnosis not present

## 2020-12-16 DIAGNOSIS — Z6827 Body mass index (BMI) 27.0-27.9, adult: Secondary | ICD-10-CM | POA: Diagnosis not present

## 2020-12-16 LAB — HM MAMMOGRAPHY

## 2021-01-09 ENCOUNTER — Encounter: Payer: Self-pay | Admitting: Physician Assistant

## 2021-01-09 ENCOUNTER — Ambulatory Visit (INDEPENDENT_AMBULATORY_CARE_PROVIDER_SITE_OTHER): Payer: Medicare Other | Admitting: Physician Assistant

## 2021-01-09 ENCOUNTER — Other Ambulatory Visit: Payer: Self-pay

## 2021-01-09 VITALS — BP 122/76 | HR 68 | Temp 97.9°F | Ht 61.0 in | Wt 148.6 lb

## 2021-01-09 DIAGNOSIS — Z0000 Encounter for general adult medical examination without abnormal findings: Secondary | ICD-10-CM | POA: Diagnosis not present

## 2021-01-09 NOTE — Patient Instructions (Signed)
Preventive Care 65 Years and Older, Female °Preventive care refers to lifestyle choices and visits with your health care provider that can promote health and wellness. Preventive care visits are also called wellness exams. °What can I expect for my preventive care visit? °Counseling °Your health care provider may ask you questions about your: °Medical history, including: °Past medical problems. °Family medical history. °Pregnancy and menstrual history. °History of falls. °Current health, including: °Memory and ability to understand (cognition). °Emotional well-being. °Home life and relationship well-being. °Sexual activity and sexual health. °Lifestyle, including: °Alcohol, nicotine or tobacco, and drug use. °Access to firearms. °Diet, exercise, and sleep habits. °Work and work environment. °Sunscreen use. °Safety issues such as seatbelt and bike helmet use. °Physical exam °Your health care provider will check your: °Height and weight. These may be used to calculate your BMI (body mass index). BMI is a measurement that tells if you are at a healthy weight. °Waist circumference. This measures the distance around your waistline. This measurement also tells if you are at a healthy weight and may help predict your risk of certain diseases, such as type 2 diabetes and high blood pressure. °Heart rate and blood pressure. °Body temperature. °Skin for abnormal spots. °What immunizations do I need? °Vaccines are usually given at various ages, according to a schedule. Your health care provider will recommend vaccines for you based on your age, medical history, and lifestyle or other factors, such as travel or where you work. °What tests do I need? °Screening °Your health care provider may recommend screening tests for certain conditions. This may include: °Lipid and cholesterol levels. °Hepatitis C test. °Hepatitis B test. °HIV (human immunodeficiency virus) test. °STI (sexually transmitted infection) testing, if you are at  risk. °Lung cancer screening. °Colorectal cancer screening. °Diabetes screening. This is done by checking your blood sugar (glucose) after you have not eaten for a while (fasting). °Mammogram. Talk with your health care provider about how often you should have regular mammograms. °BRCA-related cancer screening. This may be done if you have a family history of breast, ovarian, tubal, or peritoneal cancers. °Bone density scan. This is done to screen for osteoporosis. °Talk with your health care provider about your test results, treatment options, and if necessary, the need for more tests. °Follow these instructions at home: °Eating and drinking ° °Eat a diet that includes fresh fruits and vegetables, whole grains, lean protein, and low-fat dairy products. Limit your intake of foods with high amounts of sugar, saturated fats, and salt. °Take vitamin and mineral supplements as recommended by your health care provider. °Do not drink alcohol if your health care provider tells you not to drink. °If you drink alcohol: °Limit how much you have to 0-1 drink a day. °Know how much alcohol is in your drink. In the [place] one drink equals one 12 oz bottle of beer (355 mL), one 5 oz glass of wine (148 mL), or one 1½ oz glass of hard liquor (44 mL). °Lifestyle °Brush your teeth every morning and night with fluoride toothpaste. Floss one time each day. °Exercise for at least 30 minutes 5 or more days each week. °Do not use any products that contain nicotine or tobacco. These products include cigarettes, chewing tobacco, and vaping devices, such as e-cigarettes. If you need help quitting, ask your health care provider. °Do not use drugs. °If you are sexually active, practice safe sex. Use a condom or other form of protection in order to prevent STIs. °Take aspirin only as told by your   health care provider. Make sure that you understand how much to take and what form to take. Work with your health care provider to find out whether it  is safe and beneficial for you to take aspirin daily. Ask your health care provider if you need to take a cholesterol-lowering medicine (statin). Find healthy ways to manage stress, such as: Meditation, yoga, or listening to music. Journaling. Talking to a trusted person. Spending time with friends and family. Minimize exposure to UV radiation to reduce your risk of skin cancer. Safety Always wear your seat belt while driving or riding in a vehicle. Do not drive: If you have been drinking alcohol. Do not ride with someone who has been drinking. When you are tired or distracted. While texting. If you have been using any mind-altering substances or drugs. Wear a helmet and other protective equipment during sports activities. If you have firearms in your house, make sure you follow all gun safety procedures. What's next? Visit your health care provider once a year for an annual wellness visit. Ask your health care provider how often you should have your eyes and teeth checked. Stay up to date on all vaccines. This information is not intended to replace advice given to you by your health care provider. Make sure you discuss any questions you have with your health care provider. Document Revised: 07/10/2020 Document Reviewed: 07/10/2020 Elsevier Patient Education  [place].

## 2021-01-09 NOTE — Progress Notes (Signed)
Subjective:   Brittany Harvey is a 75 y.o. female who presents for Medicare Annual (Subsequent) preventive examination.  Review of Systems    General:   No F/C, wt loss Pulm:   No DIB, SOB, pleuritic chest pain Card:  No CP, palpitations Abd:  No n/v/d or pain Ext:  No inc edema from baseline    Objective:    Today's Vitals   01/09/21 1622 01/09/21 1646  BP: (!) 174/76 122/76  Pulse: 68   Temp: 97.9 F (36.6 C)   SpO2: 97%   Weight: 148 lb 9.6 oz (67.4 kg)   Height: 5\' 1"  (1.549 m)    Body mass index is 28.08 kg/m.  Advanced Directives 08/12/2016 08/20/2015 09/09/2014 11/19/2013 11/19/2013 04/26/2013  Does Patient Have a Medical Advance Directive? Yes Yes No No No Patient has advance directive, copy not in chart  Type of Advance Directive Healthcare Power of [place];Living will  Would patient like information on creating a medical advance directive? - - - No - patient declined information No - patient declined information -    Current Medications (verified) Outpatient Encounter Medications as of 01/09/2021  Medication Sig   Apple Cider Vinegar 500 MG TABS apple cider vinegar   aspirin EC 81 MG tablet Take 81 mg by mouth daily.   Calcium Citrate-Vitamin D (CALCIUM CITRATE + D PO) Take 1 tablet by mouth daily.   Cholecalciferol (VITAMIN D3) 1000 units CAPS Take 5 capsules by mouth daily. Told pt to increase to 5,000 IU per day   Cranberry 1000 MG CAPS Take 1 capsule by mouth daily.   fluticasone (FLONASE) 50 MCG/ACT nasal spray Place 1 spray into both nostrils 2 (two) times daily.   ibuprofen (ADVIL,MOTRIN) 600 MG tablet Take 1 tablet (600 mg total) by mouth every 8 (eight) hours as needed.   metoprolol succinate (TOPROL-XL) 25 MG 24 hr tablet Take 1 tablet (25 mg total) by mouth daily.   Multiple Vitamin (MULTIVITAMIN WITH MINERALS) TABS tablet Take 1 tablet by mouth daily. Centrum Silver   triamcinolone cream  (KENALOG) 0.1 % Apply 1 application topically daily as needed (itching/eczema).   No facility-administered encounter medications on file as of 01/09/2021.    Allergies (verified) Clindamycin/lincomycin, Penicillins, Tussin dm [guaifenesin-dm], and Zithromax [azithromycin]   History: Past Medical History:  Diagnosis Date   Anxiety    Atypical chest pain    GERD (gastroesophageal reflux disease)    Heart palpitations    Hyperlipidemia    Hypertension    Kidney stones    Prediabetes    PSVT (paroxysmal supraventricular tachycardia) ([facility])    Past Surgical History:  Procedure Laterality Date   CARDIOVASCULAR STRESS TEST  06/19/2009   Post stress myocardial perfusion images show a normal pattern of perfusion in all regions. ECG positive for ischemia. Appears to be a "false-positive" ECG stress test.   TUBAL LIGATION     Family History  Adopted: Yes   Social History   Socioeconomic History   Marital status: Married    Spouse name: Not on file   Number of children: Not on file   Years of education: Not on file   Highest education level: Not on file  Occupational History   Not on file  Tobacco Use   Smoking status: Former    Types: Cigarettes    Quit date: 01/27/1992    Years since quitting: 28.9   Smokeless tobacco: Never  Vaping  Use   Vaping Use: Never used  Substance and Sexual Activity   Alcohol use: No   Drug use: No   Sexual activity: Never    Birth control/protection: None  Other Topics Concern   Not on file  Social History Narrative   Not on file   Social Determinants of Health   Financial Resource Strain: Not on file  Food Insecurity: Not on file  Transportation Needs: Not on file  Physical Activity: Not on file  Stress: Not on file  Social Connections: Not on file    Tobacco Counseling Counseling given: Not Answered      Diabetic?no         Activities of Daily Living In your present state of health, do you have any difficulty performing  the following activities: 01/09/2021 09/09/2020  Hearing? N N  Vision? N N  Difficulty concentrating or making decisions? N N  Walking or climbing stairs? N N  Dressing or bathing? N N  Doing errands, shopping? N N  Some recent data might be hidden    Patient Care Team: Lorrene Reid, PA-C as PCP - General Gwenlyn Found Pearletha Forge, MD as PCP - Cardiology (Cardiology) Juanita Craver, MD as Consulting Physician (Gastroenterology) Lorretta Harp, MD as Consulting Physician (Cardiology) Everlene Farrier, MD as Consulting Physician (Obstetrics and Gynecology)  Indicate any recent Medical Services you may have received from other than [facility] providers in the past year (date may be approximate).     Assessment:   This is a routine wellness examination for Brittany Harvey.  Hearing/Vision screen No results found.  Dietary issues and exercise activities discussed:  -Heart healthy diet low in fat and carbohydrates. Continue to stay as active as possible.    Goals Addressed   None   Depression Screen PHQ 2/9 Scores 01/09/2021 09/09/2020 05/07/2020 01/03/2020 10/10/2019 07/05/2019 01/12/2019  PHQ - 2 Score 0 0 0 0 0 0 0  PHQ- 9 Score 0 0 0 0 0 0 0    Fall Risk Fall Risk  01/09/2021 09/09/2020 05/07/2020 01/03/2020 10/10/2019  Falls in the past year? 0 0 0 0 0  Number falls in past yr: 0 0 - - -  Injury with Fall? 0 0 - - -  Risk for fall due to : No Fall Risks No Fall Risks - - -  Follow up Falls evaluation completed Falls evaluation completed Falls evaluation completed Falls evaluation completed Falls evaluation completed    [place]:  Any stairs in or around the home? Yes  If so, are there any without handrails? Yes  Home free of loose throw rugs in walkways, pet beds, electrical cords, etc? Yes  Adequate lighting in your home to reduce risk of falls? Yes   ASSISTIVE DEVICES UTILIZED TO PREVENT FALLS:  Life alert? No  Use of a cane, walker or w/c? No  Grab bars in  the bathroom? Yes  Shower chair or bench in shower? No  Elevated toilet seat or a handicapped toilet? No   TIMED UP AND GO:  Was the test performed? Yes .  Length of time to ambulate 10 feet: 12 sec.   Gait steady and fast without use of assistive device  Cognitive Function: wnl's  6CIT Screen 01/09/2021 01/03/2020 01/12/2019 12/22/2017  What Year? 0 points 0 points 0 points 0 points  What month? 0 points 0 points 0 points 0 points  What time? 0 points 0 points 0 points 0 points  Count back from  20 0 points 0 points 0 points 0 points  Months in reverse 2 points 0 points 0 points 0 points  Repeat phrase 0 points 0 points 6 points 0 points  Total Score 2 0 6 0    Immunizations Immunization History  Administered Date(s) Administered   Influenza, High Dose Seasonal PF 10/31/2015, 10/29/2016, 11/18/2017, 11/02/2019   Influenza-Unspecified 11/04/2018, 10/30/2020   Moderna Sars-Covid-2 Vaccination 02/27/2019, 03/27/2019   Pneumococcal Conjugate-13 05/10/2015   Pneumococcal Polysaccharide-23 01/18/2013   Tdap 12/09/2010, 09/30/2012   Zoster Recombinat (Shingrix) 12/22/2017    TDAP status: Up to date  Flu Vaccine status: Up to date  Pneumococcal vaccine status: Up to date  Covid-19 vaccine status: Completed vaccines  Qualifies for Shingles Vaccine? Yes   Zostavax completed No   Shingrix Completed?: No.    Education has been provided regarding the importance of this vaccine. Patient has been advised to call insurance company to determine out of pocket expense if they have not yet received this vaccine. Advised may also receive vaccine at local pharmacy or Health Dept. Verbalized acceptance and understanding.  Screening Tests Health Maintenance  Topic Date Due   Zoster Vaccines- Shingrix (2 of 2) 02/16/2018   COVID-19 Vaccine (3 - Moderna risk series) 04/24/2019   MAMMOGRAM  11/16/2020   TETANUS/TDAP  10/01/2022   COLONOSCOPY (Pts 45-66yrs Insurance coverage will need to be  confirmed)  09/23/2023   Pneumonia Vaccine 91+ Years old  Completed   INFLUENZA VACCINE  Completed   DEXA SCAN  Completed   Hepatitis C Screening  Completed   HPV VACCINES  Aged Out    Health Maintenance  Health Maintenance Due  Topic Date Due   Zoster Vaccines- Shingrix (2 of 2) 02/16/2018   COVID-19 Vaccine (3 - Moderna risk series) 04/24/2019   MAMMOGRAM  11/16/2020    Colorectal cancer screening: Type of screening: Colonoscopy. Completed 09/22/2013. Repeat every 5 years  Mammogram status: Completed 2022. Repeat every year. Requesting records for latest mammogram from OB.   Bone Density status: Completed 05/18/2016. Results reflect: Bone density results: OSTEOPENIA. Repeat every 2 years.  Lung Cancer Screening: (Low Dose CT Chest recommended if Age 50-80 years, 30 pack-year currently smoking OR have quit w/in 15years.) does not qualify.   Lung Cancer Screening Referral: n/a  Additional Screening:  Hepatitis C Screening: does qualify; Completed 05/08/2019  Vision Screening: Recommended annual ophthalmology exams for early detection of glaucoma and other disorders of the eye. Is the patient up to date with their annual eye exam?  Yes  Who is the provider or what is the name of the office in which the patient attends annual eye exams? [facility] in [place]  If pt is not established with a provider, would they like to be referred to a provider to establish care? No .   Dental Screening: Recommended annual dental exams for proper oral hygiene  Community Resource Referral / Chronic Care Management: CRR required this visit?  No   CCM required this visit?  No      Plan:  -BP elevated on intake, BP repeated and improved. -Continue current medication regimen. -Will request records from OB/GYN ([facility]) for latest mammogram and bone density. -Follow up in 6 months for HLD, HTN, prediabetes and FBW  I have personally reviewed and noted the following in the  patients chart:   Medical and social history Use of alcohol, tobacco or illicit drugs  Current medications and supplements including opioid prescriptions.  Functional ability and status  Nutritional status Physical activity Advanced directives List of other physicians Hospitalizations, surgeries, and ER visits in previous 12 months Vitals Screenings to include cognitive, depression, and falls Referrals and appointments  In addition, I have reviewed and discussed with patient certain preventive protocols, quality metrics, and best practice recommendations. A written personalized care plan for preventive services as well as general preventive health recommendations were provided to patient.     Lorrene Reid, PA-C   01/09/2021

## 2021-02-08 DIAGNOSIS — Z1152 Encounter for screening for COVID-19: Secondary | ICD-10-CM | POA: Diagnosis not present

## 2021-02-10 ENCOUNTER — Other Ambulatory Visit: Payer: Self-pay

## 2021-02-10 ENCOUNTER — Ambulatory Visit (INDEPENDENT_AMBULATORY_CARE_PROVIDER_SITE_OTHER): Payer: Medicare Other | Admitting: Physician Assistant

## 2021-02-10 ENCOUNTER — Encounter: Payer: Self-pay | Admitting: Physician Assistant

## 2021-02-10 VITALS — BP 124/64 | HR 70 | Temp 97.6°F | Ht 61.0 in | Wt 143.9 lb

## 2021-02-10 DIAGNOSIS — N644 Mastodynia: Secondary | ICD-10-CM | POA: Diagnosis not present

## 2021-02-10 DIAGNOSIS — M79601 Pain in right arm: Secondary | ICD-10-CM

## 2021-02-10 MED ORDER — PREDNISONE 20 MG PO TABS: ORAL_TABLET | ORAL | 0 refills | Status: DC

## 2021-02-10 NOTE — Progress Notes (Signed)
Acute Office Visit  Subjective:    Patient ID: Brittany Harvey, female    DOB: 25-Oct-1945, 76 y.o.   MRN: [id number]  Chief Complaint  Patient presents with   Acute Visit    HPI Patient is in today for c/o pain underneath right arm, axilla and shoulder blade and right breast. States pain started after she had her mammogram 12/16/2020 ([facility]). Breast pain was intermittent but has been constant the past 2 weeks. Does have a hx of dense breast tissue and states most recent mammogram was the most painful she has had. Shoulder pain has mildly improved. Denies any exacerbating activities, fever, chest pain, syncope or left arm pain. Denies breast drainage, erythema, or nipple inversion/changes. Has taken Tylenol which has provided mild relief. States started taking omeprazole 20 mg 3 days ago for acid reflux which has not made a difference with her current symptoms.   Past Medical History:  Diagnosis Date   Anxiety    Atypical chest pain    GERD (gastroesophageal reflux disease)    Heart palpitations    Hyperlipidemia    Hypertension    Kidney stones    Prediabetes    PSVT (paroxysmal supraventricular tachycardia) [facility])     Past Surgical History:  Procedure Laterality Date   CARDIOVASCULAR STRESS TEST  06/19/2009   Post stress myocardial perfusion images show a normal pattern of perfusion in all regions. ECG positive for ischemia. Appears to be a "false-positive" ECG stress test.   TUBAL LIGATION      Family History  Adopted: Yes    Social History   Socioeconomic History   Marital status: Married    Spouse name: Not on file   Number of children: Not on file   Years of education: Not on file   Highest education level: Not on file  Occupational History   Not on file  Tobacco Use   Smoking status: Former    Types: Cigarettes    Quit date: 01/27/1992    Years since quitting: 29.0   Smokeless tobacco: Never  Vaping Use   Vaping Use: Never used  Substance and  Sexual Activity   Alcohol use: No   Drug use: No   Sexual activity: Never    Birth control/protection: None  Other Topics Concern   Not on file  Social History Narrative   Not on file   Social Determinants of Health   Financial Resource Strain: Not on file  Food Insecurity: Not on file  Transportation Needs: Not on file  Physical Activity: Not on file  Stress: Not on file  Social Connections: Not on file  Intimate Partner Violence: Not on file    Outpatient Medications Prior to Visit  Medication Sig Dispense Refill   Apple Cider Vinegar 500 MG TABS apple cider vinegar     aspirin EC 81 MG tablet Take 81 mg by mouth daily.     Calcium Citrate-Vitamin D (CALCIUM CITRATE + D PO) Take 1 tablet by mouth daily.     Cholecalciferol (VITAMIN D3) 1000 units CAPS Take 5 capsules by mouth daily. Told pt to increase to 5,000 IU per day     Cranberry 1000 MG CAPS Take 1 capsule by mouth daily.     fluticasone (FLONASE) 50 MCG/ACT nasal spray Place 1 spray into both nostrils 2 (two) times daily. 16 g 6   ibuprofen (ADVIL,MOTRIN) 600 MG tablet Take 1 tablet (600 mg total) by mouth every 8 (eight) hours as needed.  tablet 0  ° metoprolol succinate (TOPROL-XL) 25 MG 24 hr tablet Take 1 tablet (25 mg total) by mouth daily. 30 tablet 0  ° Multiple Vitamin (MULTIVITAMIN WITH MINERALS) TABS tablet Take 1 tablet by mouth daily. Centrum Silver    ° triamcinolone cream (KENALOG) 0.1 % Apply 1 application topically daily as needed (itching/eczema). 30 g 3  ° °No facility-administered medications prior to visit.  ° ° °Allergies  °Allergen Reactions  ° Clindamycin/Lincomycin Nausea Only  ° Penicillins Anaphylaxis and Swelling  ° Tussin Dm [Guaifenesin-Dm] Other (See Comments)  °  Dizziness/ Lightheadness  ° Zithromax [Azithromycin] Other (See Comments)  °  Pre-syncope  ° ° °Review of Systems °Review of Systems:  °A fourteen system review of systems was performed and found to be positive as per HPI. °    °Objective:  °  °Physical Exam °General:  Well Developed, well nourished, in no acute distress   °Neuro:  Alert and oriented,  extra-ocular muscles intact  °HEENT:  Normocephalic, atraumatic, neck supple °Skin:  no gross rash, warm, pink. °Cardiac:  RRR, S1 S2 °Respiratory: CTA B/L °Vascular:  Ext warm, no cyanosis apprec.; cap RF less 2 sec. °MSK: Tenderness of right shoulder blade, right lower ribcage and breast (UIQ). No cervical or thoracic spine/midline tenderness. Overall good ROM of UE with some discomfort with right shoulder extension and external rotation.  °Psych:  No HI/SI, judgement and insight good, Euthymic mood. Full Affect. ° °BP 124/64    Pulse 70    Temp 97.6 °F (36.4 °C)    Ht 5' 1" (1.549 m)    Wt 143 lb 14.4 oz (65.3 kg)    SpO2 99%    BMI 27.19 kg/m²  °Wt Readings from Last 3 Encounters:  °02/10/21 143 lb 14.4 oz (65.3 kg)  °01/09/21 148 lb 9.6 oz (67.4 kg)  °10/30/20 146 lb 12.8 oz (66.6 kg)  ° ° °Health Maintenance Due  °Topic Date Due  ° Zoster Vaccines- Shingrix (2 of 2) 02/16/2018  ° COVID-19 Vaccine (3 - Moderna risk series) 04/24/2019  ° MAMMOGRAM  11/16/2020  ° ° °There are no preventive care reminders to display for this patient. ° ° °Lab Results  °Component Value Date  ° TSH 1.350 12/29/2019  ° °Lab Results  °Component Value Date  ° WBC 7.0 05/07/2020  ° HGB 13.4 05/07/2020  ° HCT 39.4 05/07/2020  ° MCV 91 05/07/2020  ° PLT 275 05/07/2020  ° °Lab Results  °Component Value Date  ° NA 143 10/02/2020  ° K 4.2 10/02/2020  ° CO2 22 10/02/2020  ° GLUCOSE 86 10/02/2020  ° BUN 15 10/02/2020  ° CREATININE 0.75 10/02/2020  ° BILITOT 0.6 10/02/2020  ° ALKPHOS 77 10/02/2020  ° AST 20 10/02/2020  ° ALT 12 10/02/2020  ° PROT 7.3 10/02/2020  ° ALBUMIN 4.4 10/02/2020  ° CALCIUM 9.4 10/02/2020  ° ANIONGAP 12 09/09/2014  ° EGFR 83 10/02/2020  ° °Lab Results  °Component Value Date  ° CHOL 216 (H) 10/02/2020  ° °Lab Results  °Component Value Date  ° HDL 83 10/02/2020  ° °Lab Results  °Component Value  Date  ° LDLCALC 118 (H) 10/02/2020  ° °Lab Results  °Component Value Date  ° TRIG 84 10/02/2020  ° °Lab Results  °Component Value Date  ° CHOLHDL 2.6 10/02/2020  ° °Lab Results  °Component Value Date  ° HGBA1C 5.8 (H) 10/02/2020  ° ° °   °Assessment & Plan:  ° °Problem List Items Addressed This Visit   °  Visit   None Visit Diagnoses     Right arm pain    -  Primary   Relevant Medications   predniSONE (DELTASONE) 20 MG tablet   Breast pain, right          Discussed with patient s/sx are suggestive of MSK etiology. Recommend trial of corticosteroid therapy and patient is agreeable. Discussed potential side effects and advised to let me know if unable to tolerate medication, and will consider muscle relaxer. Will request mammogram report. Advised if breast pain fails to improve or worsen then recommend obtaining diagnostic mammogram. Pt verbalized understanding.    Meds ordered this encounter  Medications   predniSONE (DELTASONE) 20 MG tablet    Sig: Take 2 tablets by mouth x 2 days, 1 tablets x 2 days, 0.5 tablet x 2 days    Dispense:  7 tablet    Refill:  0    Order Specific Question:   Supervising Provider    Answer:   Beatrice Lecher D [[id number]]     Lorrene Reid, PA-C

## 2021-02-13 ENCOUNTER — Encounter: Payer: Self-pay | Admitting: Physician Assistant

## 2021-02-21 ENCOUNTER — Other Ambulatory Visit: Payer: Self-pay | Admitting: Cardiovascular Disease

## 2021-03-19 DIAGNOSIS — Z1152 Encounter for screening for COVID-19: Secondary | ICD-10-CM | POA: Diagnosis not present

## 2021-04-15 DIAGNOSIS — Z1152 Encounter for screening for COVID-19: Secondary | ICD-10-CM | POA: Diagnosis not present

## 2021-04-21 NOTE — Progress Notes (Deleted)
?  Established patient acute visit ? ? ?Patient: Brittany Harvey   DOB: 1945/07/26   76 y.o. Female  MRN: [id number] ?Visit Date: 04/22/2021 ? ?No chief complaint on file. ? ?Subjective  ?  ?HPI  ?Patient presents for c/o ? ? ? ?Medications: ?Outpatient Medications Prior to Visit  ?Medication Sig  ? Apple Cider Vinegar 500 MG TABS apple cider vinegar  ? aspirin EC 81 MG tablet Take 81 mg by mouth daily.  ? Calcium Citrate-Vitamin D (CALCIUM CITRATE + D PO) Take 1 tablet by mouth daily.  ? Cholecalciferol (VITAMIN D3) 1000 units CAPS Take 5 capsules by mouth daily. Told pt to increase to 5,000 IU per day  ? Cranberry 1000 MG CAPS Take 1 capsule by mouth daily.  ? fluticasone (FLONASE) 50 MCG/ACT nasal spray Place 1 spray into both nostrils 2 (two) times daily.  ? ibuprofen (ADVIL,MOTRIN) 600 MG tablet Take 1 tablet (600 mg total) by mouth every 8 (eight) hours as needed.  ? metoprolol succinate (TOPROL-XL) 25 MG 24 hr tablet Take 1.5 tablets (37.5 mg total) by mouth daily.  ? Multiple Vitamin (MULTIVITAMIN WITH MINERALS) TABS tablet Take 1 tablet by mouth daily. Centrum Silver  ? predniSONE (DELTASONE) 20 MG tablet Take 2 tablets by mouth x 2 days, 1 tablets x 2 days, 0.5 tablet x 2 days  ? triamcinolone cream (KENALOG) 0.1 % Apply 1 application topically daily as needed (itching/eczema).  ? ?No facility-administered medications prior to visit.  ? ? ?Review of Systems ?Review of Systems:  ?A fourteen system review of systems was performed and found to be positive as per HPI. ? ? ?{Labs  Heme  Chem  Endocrine  Serology  Results Review (optional):[id number]} ?  Objective  ?  ?There were no vitals taken for this visit. ? ? ?Physical Exam  ?*** ? ?No results found for any visits on 04/22/21. ? Assessment & Plan  ?  ? ?*** ? ? ?No follow-ups on file.  ?   ? ? ? ?Lorrene Reid, PA-C  ?[facility] ?[phone] (phone) ?[phone] (fax) ? ?[facility] ?

## 2021-04-22 ENCOUNTER — Ambulatory Visit: Payer: Medicare Other | Admitting: Physician Assistant

## 2021-04-28 ENCOUNTER — Encounter: Payer: Self-pay | Admitting: Physician Assistant

## 2021-04-28 ENCOUNTER — Ambulatory Visit (INDEPENDENT_AMBULATORY_CARE_PROVIDER_SITE_OTHER): Payer: Medicare Other | Admitting: Physician Assistant

## 2021-04-28 VITALS — BP 140/68 | HR 62 | Temp 98.2°F | Ht 61.0 in | Wt 140.0 lb

## 2021-04-28 DIAGNOSIS — R1030 Lower abdominal pain, unspecified: Secondary | ICD-10-CM

## 2021-04-28 DIAGNOSIS — R142 Eructation: Secondary | ICD-10-CM | POA: Diagnosis not present

## 2021-04-28 DIAGNOSIS — R197 Diarrhea, unspecified: Secondary | ICD-10-CM

## 2021-04-28 DIAGNOSIS — R110 Nausea: Secondary | ICD-10-CM | POA: Diagnosis not present

## 2021-04-28 LAB — POCT URINALYSIS DIPSTICK
Bilirubin, UA: NEGATIVE
Glucose, UA: NEGATIVE
Nitrite, UA: NEGATIVE
Protein, UA: NEGATIVE
Spec Grav, UA: 1.02 (ref 1.010–1.025)
Urobilinogen, UA: 1 E.U./dL
pH, UA: 7.5 (ref 5.0–8.0)

## 2021-04-28 MED ORDER — ONDANSETRON HCL 4 MG PO TABS: 4.0000 mg | ORAL_TABLET | Freq: Three times a day (TID) | ORAL | 0 refills | Status: DC | PRN

## 2021-04-28 NOTE — Patient Instructions (Signed)
Diarrhea, Adult ?Diarrhea is frequent loose and watery bowel movements. Diarrhea can make you feel weak and cause you to become dehydrated. Dehydration can make you tired and thirsty, cause you to have a dry mouth, and decrease how often you urinate. ?Diarrhea typically lasts 2-3 days. However, it can last longer if it is a sign of something more serious. It is important to treat your diarrhea as told by your health care provider. ?Follow these instructions at home: ?Eating and drinking ?  ?Follow these recommendations as told by your health care provider: ?Take an oral rehydration solution (ORS). This is an over-the-counter medicine that helps return your body to its normal balance of nutrients and water. It is found at pharmacies and retail stores. ?Drink plenty of fluids, such as water, ice chips, diluted fruit juice, and low-calorie sports drinks. You can drink milk also, if desired. ?Avoid drinking fluids that contain a lot of sugar or caffeine, such as energy drinks, sports drinks, and soda. ?Eat bland, easy-to-digest foods in small amounts as you are able. These foods include bananas, applesauce, rice, lean meats, toast, and crackers. ?Avoid alcohol. ?Avoid spicy or fatty foods. ? ?Medicines ?Take over-the-counter and prescription medicines only as told by your health care provider. ?If you were prescribed an antibiotic medicine, take it as told by your health care provider. Do not stop using the antibiotic even if you start to feel better. ?General instructions ? ?Wash your hands often using soap and water. If soap and water are not available, use a hand sanitizer. Others in the household should wash their hands as well. Hands should be washed: ?After using the toilet or changing a diaper. ?Before preparing, cooking, or serving food. ?While caring for a sick person or while visiting someone in a hospital. ?Drink enough fluid to keep your urine pale yellow. ?Rest at home while you recover. ?Watch your  condition for any changes. ?Take a warm bath to relieve any burning or pain from frequent diarrhea episodes. ?Keep all follow-up visits as told by your health care provider. This is important. ?Contact a health care provider if: ?You have a fever. ?Your diarrhea gets worse. ?You have new symptoms. ?You cannot keep fluids down. ?You feel light-headed or dizzy. ?You have a headache. ?You have muscle cramps. ?Get help right away if: ?You have chest pain. ?You feel extremely weak or you faint. ?You have bloody or black stools or stools that look like tar. ?You have severe pain, cramping, or bloating in your abdomen. ?You have trouble breathing or you are breathing very quickly. ?Your heart is beating very quickly. ?Your skin feels cold and clammy. ?You feel confused. ?You have signs of dehydration, such as: ?Dark urine, very little urine, or no urine. ?Cracked lips. ?Dry mouth. ?Sunken eyes. ?Sleepiness. ?Weakness. ?Summary ?Diarrhea is frequent loose and sometimes watery bowel movements. Diarrhea can make you feel weak and cause you to become dehydrated. ?Drink enough fluids to keep your urine pale yellow. ?Make sure that you wash your hands after using the toilet. If soap and water are not available, use hand sanitizer. ?Contact a health care provider if your diarrhea gets worse or you have new symptoms. ?Get help right away if you have signs of dehydration. ?This information is not intended to replace advice given to you by your health care provider. Make sure you discuss any questions you have with your health care provider. ?Document Revised: 07/24/2020 Document Reviewed: 07/24/2020 ?Elsevier Patient Education ? 2022 [employer] ? ?

## 2021-04-28 NOTE — Progress Notes (Signed)
? ?Established Patient Office Visit ? ?Subjective:  ?Patient ID: Brittany Harvey, female    DOB: 08-30-1945  Age: 76 y.o. MRN: [id number] ? ?CC:  ?Chief Complaint  ?Patient presents with  ? Follow-up  ? ? ?HPI ?Brittany Harvey presents for concerns of not feeling so well. States two Friday's ago in the evening had a sore throat and did warm water gargles, it has resolved. Did a Covid test 2 weeks ago which resulted neg. This past Thursday felt facial numbness/needles and like passing out, but didn't pass out. All weekend has felt tired, upset stomach and nausea with diarrhea (loose stool) if she eats solids. Diarrhea last week was watery. Yesterday felt cold and had sweats. No dysuria, vomiting, tarry or rectal bleeding. Did have cramps with diarrhea episodes but no consistent abdominal pain. Also reports flatulence and some belching. Has a hx of constipation and takes dulcolax to help which she currently has not been taking.  ? ?Past Medical History:  ?Diagnosis Date  ? Anxiety   ? Atypical chest pain   ? GERD (gastroesophageal reflux disease)   ? Heart palpitations   ? Hyperlipidemia   ? Hypertension   ? Kidney stones   ? Prediabetes   ? PSVT (paroxysmal supraventricular tachycardia) ([place])   ? ? ?Past Surgical History:  ?Procedure Laterality Date  ? CARDIOVASCULAR STRESS TEST  06/19/2009  ? Post stress myocardial perfusion images show a normal pattern of perfusion in all regions. ECG positive for ischemia. Appears to be a "false-positive" ECG stress test.  ? TUBAL LIGATION    ? ? ?Family History  ?Adopted: Yes  ? ? ?Social History  ? ?Socioeconomic History  ? Marital status: Married  ?  Spouse name: Not on file  ? Number of children: Not on file  ? Years of education: Not on file  ? Highest education level: Not on file  ?Occupational History  ? Not on file  ?Tobacco Use  ? Smoking status: Former  ?  Types: Cigarettes  ?  Quit date: 01/27/1992  ?  Years since quitting: 29.2  ? Smokeless tobacco: Never  ?Vaping Use  ? Vaping  Use: Never used  ?Substance and Sexual Activity  ? Alcohol use: No  ? Drug use: No  ? Sexual activity: Never  ?  Birth control/protection: None  ?Other Topics Concern  ? Not on file  ?Social History Narrative  ? Not on file  ? ?Social Determinants of Health  ? ?Financial Resource Strain: Not on file  ?Food Insecurity: Not on file  ?Transportation Needs: Not on file  ?Physical Activity: Not on file  ?Stress: Not on file  ?Social Connections: Not on file  ?Intimate Partner Violence: Not on file  ? ? ?Outpatient Medications Prior to Visit  ?Medication Sig Dispense Refill  ? Apple Cider Vinegar 500 MG TABS apple cider vinegar    ? aspirin EC 81 MG tablet Take 81 mg by mouth daily.    ? Calcium Citrate-Vitamin D (CALCIUM CITRATE + D PO) Take 1 tablet by mouth daily.    ? Cholecalciferol (VITAMIN D3) 1000 units CAPS Take 5 capsules by mouth daily. Told pt to increase to 5,000 IU per day    ? Cranberry 1000 MG CAPS Take 1 capsule by mouth daily.    ? fluticasone (FLONASE) 50 MCG/ACT nasal spray Place 1 spray into both nostrils 2 (two) times daily. 16 g 6  ? ibuprofen (ADVIL,MOTRIN) 600 MG tablet Take 1 tablet (600 mg total) by  mouth every 8 (eight) hours as needed. 30 tablet 0  ? metoprolol succinate (TOPROL-XL) 25 MG 24 hr tablet Take 1.5 tablets (37.5 mg total) by mouth daily. 135 tablet 3  ? Multiple Vitamin (MULTIVITAMIN WITH MINERALS) TABS tablet Take 1 tablet by mouth daily. Centrum Silver    ? triamcinolone cream (KENALOG) 0.1 % Apply 1 application topically daily as needed (itching/eczema). 30 g 3  ? predniSONE (DELTASONE) 20 MG tablet Take 2 tablets by mouth x 2 days, 1 tablets x 2 days, 0.5 tablet x 2 days 7 tablet 0  ? ?No facility-administered medications prior to visit.  ? ? ?Allergies  ?Allergen Reactions  ? Clindamycin/Lincomycin Nausea Only  ? Penicillins Anaphylaxis and Swelling  ? Tussin Dm [Guaifenesin-Dm] Other (See Comments)  ?  Dizziness/ Lightheadness  ? Zithromax [Azithromycin] Other (See Comments)   ?  Pre-syncope  ? ? ?ROS ?Review of Systems ?Review of Systems:  ?A fourteen system review of systems was performed and found to be positive as per HPI. ?  ?Objective:  ?  ?Physical Exam ?General:  Well Developed, well nourished, appropriate for stated age.  ?Neuro:  Alert and oriented,  extra-ocular muscles intact  ?HEENT:  Normocephalic, atraumatic, neck supple  ?Skin:  no gross rash. ?Abdomen: +BS, suprapubic tenderness, non-distended, no guarding or rebound tenderness, negative Murphy's and Rovsing's signs  ?Cardiac:  RRR, S1 S2 ?Respiratory: CTA B/L  ?Vascular:  Ext warm, no cyanosis apprec.; cap RF less 2 sec. ?Psych:  No HI/SI, judgement and insight good, Euthymic mood. Full Affect. ? ?BP 140/68   Pulse 62   Temp 98.2 ?F (36.8 ?C)   Ht _0  (1.549 m)   Wt 140 lb (63.5 kg)   SpO2 98%   BMI 26.45 kg/m?  ?Wt Readings from Last 3 Encounters:  ?04/28/21 140 lb (63.5 kg)  ?02/10/21 143 lb 14.4 oz (65.3 kg)  ?01/09/21 148 lb 9.6 oz (67.4 kg)  ? ? ? ?Health Maintenance Due  ?Topic Date Due  ? Zoster Vaccines- Shingrix (2 of 2) 02/16/2018  ? COVID-19 Vaccine (3 - Moderna risk series) 04/24/2019  ? ? ?There are no preventive care reminders to display for this patient. ? ?Lab Results  ?Component Value Date  ? TSH 1.350 12/29/2019  ? ?Lab Results  ?Component Value Date  ? WBC 7.0 05/07/2020  ? HGB 13.4 05/07/2020  ? HCT 39.4 05/07/2020  ? MCV 91 05/07/2020  ? PLT 275 05/07/2020  ? ?Lab Results  ?Component Value Date  ? NA 143 10/02/2020  ? K 4.2 10/02/2020  ? CO2 22 10/02/2020  ? GLUCOSE 86 10/02/2020  ? BUN 15 10/02/2020  ? CREATININE 0.75 10/02/2020  ? BILITOT 0.6 10/02/2020  ? ALKPHOS 77 10/02/2020  ? AST 20 10/02/2020  ? ALT 12 10/02/2020  ? PROT 7.3 10/02/2020  ? ALBUMIN 4.4 10/02/2020  ? CALCIUM 9.4 10/02/2020  ? ANIONGAP 12 09/09/2014  ? EGFR 83 10/02/2020  ? ?Lab Results  ?Component Value Date  ? CHOL 216 (H) 10/02/2020  ? ?Lab Results  ?Component Value Date  ? HDL 83 10/02/2020  ? ?Lab Results   ?Component Value Date  ? LDLCALC 118 (H) 10/02/2020  ? ?Lab Results  ?Component Value Date  ? TRIG 84 10/02/2020  ? ?Lab Results  ?Component Value Date  ? CHOLHDL 2.6 10/02/2020  ? ?Lab Results  ?Component Value Date  ? HGBA1C 5.8 (H) 10/02/2020  ? ? ?  ?Assessment & Plan:  ? ?Problem List Items Addressed This  Visit   ?None ?Visit Diagnoses   ? ? Diarrhea, unspecified type    -  Primary  ? Relevant Orders  ? CBC w/Diff  ? Comp Met (CMET)  ? Lipase  ? H. pylori antigen, stool  ? Stool Culture  ? Nausea      ? Relevant Medications  ? ondansetron (ZOFRAN) 4 MG tablet  ? Other Relevant Orders  ? CBC w/Diff  ? Comp Met (CMET)  ? Lipase  ? H. pylori antigen, stool  ? Belching      ? Relevant Orders  ? CBC w/Diff  ? Comp Met (CMET)  ? Lipase  ? H. pylori antigen, stool  ? Lower abdominal pain      ? Relevant Orders  ? POCT urinalysis dipstick (Completed)  ? Urine Culture  ? ?  ? ?Likely viral gastroenteritis. Will collect labs and stool studies for further evaluation. Due to suprapubic tenderness will collect UA to rule out UTI and send for urine culture if abnormal. Recommend to continue with clear liquids and advance diet as tolerated. Take ondansetron as needed for nausea. If labs suggestive of bacterial gastroenteritis/infection then will consider starting antibiotic therapy. ? ?Meds ordered this encounter  ?Medications  ? ondansetron (ZOFRAN) 4 MG tablet  ?  Sig: Take 1 tablet (4 mg total) by mouth every 8 (eight) hours as needed for nausea or vomiting.  ?  Dispense:  20 tablet  ?  Refill:  0  ?  Order Specific Question:   Supervising Provider  ?  Answer:   Beatrice Lecher D [[id number]]  ? ? ?Follow-up: Return if symptoms worsen or fail to improve.  ? ? ?Lorrene Reid, PA-C ?

## 2021-04-29 LAB — CBC WITH DIFFERENTIAL/PLATELET
Basophils Absolute: 0 10*3/uL (ref 0.0–0.2)
Basos: 0 %
EOS (ABSOLUTE): 0.1 10*3/uL (ref 0.0–0.4)
Eos: 2 %
Hematocrit: 40 % (ref 34.0–46.6)
Hemoglobin: 13.8 g/dL (ref 11.1–15.9)
Immature Grans (Abs): 0 10*3/uL (ref 0.0–0.1)
Immature Granulocytes: 0 %
Lymphocytes Absolute: 2.7 10*3/uL (ref 0.7–3.1)
Lymphs: 40 %
MCH: 30.2 pg (ref 26.6–33.0)
MCHC: 34.5 g/dL (ref 31.5–35.7)
MCV: 88 fL (ref 79–97)
Monocytes Absolute: 0.5 10*3/uL (ref 0.1–0.9)
Monocytes: 7 %
Neutrophils Absolute: 3.5 10*3/uL (ref 1.4–7.0)
Neutrophils: 51 %
Platelets: 316 10*3/uL (ref 150–450)
RBC: 4.57 x10E6/uL (ref 3.77–5.28)
RDW: 12.7 % (ref 11.7–15.4)
WBC: 6.8 10*3/uL (ref 3.4–10.8)

## 2021-04-29 LAB — COMPREHENSIVE METABOLIC PANEL
ALT: 18 IU/L (ref 0–32)
AST: 24 IU/L (ref 0–40)
Albumin/Globulin Ratio: 1.6 (ref 1.2–2.2)
Albumin: 4.6 g/dL (ref 3.7–4.7)
Alkaline Phosphatase: 84 IU/L (ref 44–121)
BUN/Creatinine Ratio: 12 (ref 12–28)
BUN: 8 mg/dL (ref 8–27)
Bilirubin Total: 0.5 mg/dL (ref 0.0–1.2)
CO2: 23 mmol/L (ref 20–29)
Calcium: 9.8 mg/dL (ref 8.7–10.3)
Chloride: 104 mmol/L (ref 96–106)
Creatinine, Ser: 0.69 mg/dL (ref 0.57–1.00)
Globulin, Total: 2.9 g/dL (ref 1.5–4.5)
Glucose: 88 mg/dL (ref 70–99)
Potassium: 3.8 mmol/L (ref 3.5–5.2)
Sodium: 142 mmol/L (ref 134–144)
Total Protein: 7.5 g/dL (ref 6.0–8.5)
eGFR: 91 mL/min/{1.73_m2} (ref >59)

## 2021-04-29 LAB — LIPASE: Lipase: 14 U/L (ref 14–85)

## 2021-05-01 LAB — URINE CULTURE

## 2021-05-03 LAB — STOOL CULTURE: E coli, Shiga toxin Assay: NEGATIVE

## 2021-05-03 LAB — H. PYLORI ANTIGEN, STOOL

## 2021-05-09 DIAGNOSIS — Z1152 Encounter for screening for COVID-19: Secondary | ICD-10-CM | POA: Diagnosis not present

## 2021-05-13 DIAGNOSIS — K5900 Constipation, unspecified: Secondary | ICD-10-CM | POA: Diagnosis not present

## 2021-05-13 DIAGNOSIS — Z86010 Personal history of colonic polyps: Secondary | ICD-10-CM | POA: Diagnosis not present

## 2021-05-13 DIAGNOSIS — K5730 Diverticulosis of large intestine without perforation or abscess without bleeding: Secondary | ICD-10-CM | POA: Diagnosis not present

## 2021-05-13 DIAGNOSIS — K219 Gastro-esophageal reflux disease without esophagitis: Secondary | ICD-10-CM | POA: Diagnosis not present

## 2021-05-13 DIAGNOSIS — Z1211 Encounter for screening for malignant neoplasm of colon: Secondary | ICD-10-CM | POA: Diagnosis not present

## 2021-05-30 ENCOUNTER — Telehealth: Payer: Self-pay | Admitting: Physician Assistant

## 2021-05-30 DIAGNOSIS — Z1152 Encounter for screening for COVID-19: Secondary | ICD-10-CM | POA: Diagnosis not present

## 2021-05-30 NOTE — Telephone Encounter (Signed)
I would suggest patient to converse with pharmacist to see which medications are safe to take with Metoprolol ?

## 2021-05-30 NOTE — Telephone Encounter (Signed)
Patient is aware 

## 2021-05-30 NOTE — Telephone Encounter (Signed)
Patient is having terrible allergies and I mentioned her getting some OTC medications she's just worried she cannot take any of that due to the metoprolol. Please advise. [phone] ?

## 2021-06-02 ENCOUNTER — Encounter: Payer: Self-pay | Admitting: Physician Assistant

## 2021-06-02 ENCOUNTER — Ambulatory Visit (INDEPENDENT_AMBULATORY_CARE_PROVIDER_SITE_OTHER): Payer: Medicare Other | Admitting: Physician Assistant

## 2021-06-02 VITALS — BP 122/64 | HR 72 | Temp 97.7°F | Ht 61.0 in | Wt 141.0 lb

## 2021-06-02 DIAGNOSIS — J40 Bronchitis, not specified as acute or chronic: Secondary | ICD-10-CM

## 2021-06-02 MED ORDER — METHYLPREDNISOLONE 4 MG PO TBPK: ORAL_TABLET | ORAL | 0 refills | Status: DC

## 2021-06-02 NOTE — Progress Notes (Signed)
?Established patient acute visit ? ? ?Patient: Brittany Harvey   DOB: Jul 11, 1945   76 y.o. Female  MRN: [id number] ?Visit Date: 06/02/2021 ? ?Chief Complaint  ?Patient presents with  ? Cough  ? ?Subjective  ?  ?HPI  ?Patient presents with c/o hoarseness and cough. Reports took her dog to the groom and there was hair everywhere. Symptoms started after then. Also reports runny nose. Has started taking Claritin which has helped some. No wheezing. Continues to have some chest tightness. No fever, wheezing, sore throat, itchy or watery eyes, earache, headache, or nausea/vomiting.  ? ? ? ?Medications: ?Outpatient Medications Prior to Visit  ?Medication Sig  ? Apple Cider Vinegar 500 MG TABS apple cider vinegar  ? aspirin EC 81 MG tablet Take 81 mg by mouth daily.  ? Calcium Citrate-Vitamin D (CALCIUM CITRATE + D PO) Take 1 tablet by mouth daily.  ? Cholecalciferol (VITAMIN D3) 1000 units CAPS Take 5 capsules by mouth daily. Told pt to increase to 5,000 IU per day  ? Cranberry 1000 MG CAPS Take 1 capsule by mouth daily.  ? fluticasone (FLONASE) 50 MCG/ACT nasal spray Place 1 spray into both nostrils 2 (two) times daily.  ? ibuprofen (ADVIL,MOTRIN) 600 MG tablet Take 1 tablet (600 mg total) by mouth every 8 (eight) hours as needed.  ? metoprolol succinate (TOPROL-XL) 25 MG 24 hr tablet Take 1.5 tablets (37.5 mg total) by mouth daily.  ? Multiple Vitamin (MULTIVITAMIN WITH MINERALS) TABS tablet Take 1 tablet by mouth daily. Centrum Silver  ? ondansetron (ZOFRAN) 4 MG tablet Take 1 tablet (4 mg total) by mouth every 8 (eight) hours as needed for nausea or vomiting.  ? triamcinolone cream (KENALOG) 0.1 % Apply 1 application topically daily as needed (itching/eczema).  ? ?No facility-administered medications prior to visit.  ? ? ?Review of Systems ?Review of Systems:  ?A fourteen system review of systems was performed and found to be positive as per HPI. ? ? ?  Objective  ?  ?BP (!) 146/72   Pulse 72   Temp 97.7 ?F (36.5 ?C)    Ht '5\' 1"'$  (1.549 m)   Wt 141 lb (64 kg)   SpO2 97%   BMI 26.64 kg/m?  ? ? ?Physical Exam  ?General:  Well Developed, well nourished, appropriate for stated age.  ?Neuro:  Alert and oriented,  extra-ocular muscles intact  ?HEENT:  Normocephalic, atraumatic, PERRL, no sinus tenderness, normal TM's of both ears, pale nasal mucosa, no boggy turbinates, normal posterior oropharynx, neck supple, no adenopathy   ?Skin:  no gross rash, warm, pink. ?Cardiac:  RRR, S1 S2 ?Respiratory: CTA B/L w/o wheezing, crackles or rales. Slight dec air movement at lung bases.  ?Vascular:  Ext warm, no cyanosis apprec.; cap RF less 2 sec. ?Psych:  No HI/SI, judgement and insight good, Euthymic mood. Full Affect. ? ? ?No results found for any visits on 06/02/21. ? Assessment & Plan  ?  ? ?Discussed with patient has s/sx suggestive of bronchitis likely triggered by allergen exposure. Will start oral corticosteroid to help reduce inflammation and improve symptoms especially chest tightness. Oxygen saturation 97%. Recommend to continue with Claritin daily. Offered albuterol, pt declined due to reaction in the past which caused her to be hospitalized because of arrhythmia. Will update med allergy list. ? ? ?Return if symptoms worsen or fail to improve.  ?   ? ? ? ?Lorrene Reid, PA-C  ?[facility] ?[phone] (phone) ?[phone] (fax) ? ?  [facility] ?

## 2021-06-02 NOTE — Patient Instructions (Addendum)
Acute Bronchitis, Adult ? ?Acute bronchitis is sudden inflammation of the main airways (bronchi) that come off the windpipe (trachea) in the lungs. The swelling causes the airways to get smaller and make more mucus than normal. This can make it hard to breathe and can cause coughing or noisy breathing (wheezing). ?Acute bronchitis may last several weeks. The cough may last longer. Allergies, asthma, and exposure to smoke may make the condition worse. ?What are the causes? ?This condition can be caused by germs and by substances that irritate the lungs, including: ?Cold and flu viruses. The most common cause of this condition is the virus that causes the common cold. ?Bacteria. This is less common. ?Breathing in substances that irritate the lungs, including: ?Smoke from cigarettes and other forms of tobacco. ?Dust and pollen. ?Fumes from household cleaning products, gases, or burned fuel. ?Indoor or outdoor air pollution. ?What increases the risk? ?The following factors may make you more likely to develop this condition: ?A weak body's defense system, also called the immune system. ?A condition that affects your lungs and breathing, such as asthma. ?What are the signs or symptoms? ?Common symptoms of this condition include: ?Coughing. This may bring up clear, yellow, or green mucus from your lungs (sputum). ?Wheezing. ?Runny or stuffy nose. ?Having too much mucus in your lungs (chest congestion). ?Shortness of breath. ?Aches and pains, including sore throat or chest. ?How is this diagnosed? ?This condition is usually diagnosed based on: ?Your symptoms and medical history. ?A physical exam. ?You may also have other tests, including tests to rule out other conditions, such as pneumonia. These tests include: ?A test of lung function. ?Test of a mucus sample to look for the presence of bacteria. ?Tests to check the oxygen level in your blood. ?Blood tests. ?Chest X-ray. ?How is this treated? ?Most cases of acute  bronchitis clear up over time without treatment. Your health care provider may recommend: ?Drinking more fluids to help thin your mucus so it is easier to cough up. ?Taking inhaled medicine (inhaler) to improve air flow in and out of your lungs. ?Using a vaporizer or a humidifier. These are machines that add water to the air to help you breathe better. ?Taking a medicine that thins mucus and clears congestion (expectorant). ?Taking a medicine that prevents or stops coughing (cough suppressant). ?It is notcommon to take an antibiotic medicine for this condition. ?Follow these instructions at home: ? ?Take over-the-counter and prescription medicines only as told by your health care provider. ?Use an inhaler, vaporizer, or humidifier as told by your health care provider. ?Take two teaspoons (10 mL) of honey at bedtime to lessen coughing at night. ?Drink enough fluid to keep your urine pale yellow. ?Do not use any products that contain nicotine or tobacco. These products include cigarettes, chewing tobacco, and vaping devices, such as e-cigarettes. If you need help quitting, ask your health care provider. ?Get plenty of rest. ?Return to your normal activities as told by your health care provider. Ask your health care provider what activities are safe for you. ?Keep all follow-up visits. This is important. ?How is this prevented? ?To lower your risk of getting this condition again: ?Wash your hands often with soap and water for at least 20 seconds. If soap and water are not available, use hand sanitizer. ?Avoid contact with people who have cold symptoms. ?Try not to touch your mouth, nose, or eyes with your hands. ?Avoid breathing in smoke or chemical fumes. Breathing smoke or chemical fumes will make your   condition worse. ?Get the flu shot every year. ?Contact a health care provider if: ?Your symptoms do not improve after 2 weeks. ?You have trouble coughing up the mucus. ?Your cough keeps you awake at night. ?You have a  fever. ?Get help right away if you: ?Cough up blood. ?Feel pain in your chest. ?Have severe shortness of breath. ?Faint or keep feeling like you are going to faint. ?Have a severe headache. ?Have a fever or chills that get worse. ?These symptoms may represent a serious problem that is an emergency. Do not wait to see if the symptoms will go away. Get medical help right away. Call your local emergency services (911 in the [place] Do not drive yourself to the hospital. ?Summary ?Acute bronchitis is inflammation of the main airways (bronchi) that come off the windpipe (trachea) in the lungs. The swelling causes the airways to get smaller and make more mucus than normal. ?Drinking more fluids can help thin your mucus so it is easier to cough up. ?Take over-the-counter and prescription medicines only as told by your health care provider. ?Do not use any products that contain nicotine or tobacco. These products include cigarettes, chewing tobacco, and vaping devices, such as e-cigarettes. If you need help quitting, ask your health care provider. ?Contact a health care provider if your symptoms do not improve after 2 weeks. ?This information is not intended to replace advice given to you by your health care provider. Make sure you discuss any questions you have with your health care provider. ?Document Revised: 05/15/2020 Document Reviewed: 05/15/2020 ?Elsevier Patient Education ? [place]. ? ?Cough, Adult ?A cough helps to clear your throat and lungs. A cough may be a sign of an illness or another medical condition. ?An acute cough may only last 2-3 weeks, while a chronic cough may last 8 or more weeks. ?Many things can cause a cough. They include: ?Germs (viruses or bacteria) that attack the airway. ?Breathing in things that bother (irritate) your lungs. ?Allergies. ?Asthma. ?Mucus that runs down the back of your throat (postnasal drip). ?Smoking. ?Acid backing up from the stomach into the tube that moves food  from the mouth to the stomach (gastroesophageal reflux). ?Some medicines. ?Lung problems. ?Other medical conditions, such as heart failure or a blood clot in the lung (pulmonary embolism). ?Follow these instructions at home: ?Medicines ?Take over-the-counter and prescription medicines only as told by your doctor. ?Talk with your doctor before you take medicines that stop a cough (cough suppressants). ?Lifestyle ? ?Do not smoke, and try not to be around smoke. Do not use any products that contain nicotine or tobacco, such as cigarettes, e-cigarettes, and chewing tobacco. If you need help quitting, ask your doctor. ?Drink enough fluid to keep your pee (urine) pale yellow. ?Avoid caffeine. ?Do not drink alcohol if your doctor tells you not to drink. ?General instructions ? ?Watch for any changes in your cough. Tell your doctor about them. ?Always cover your mouth when you cough. ?Stay away from things that make you cough, such as perfume, candles, campfire smoke, or cleaning products. ?If the air is dry, use a cool mist vaporizer or humidifier in your home. ?If your cough is worse at night, try using extra pillows to raise your head up higher while you sleep. ?Rest as needed. ?Keep all follow-up visits as told by your doctor. This is important. ?Contact a doctor if: ?You have new symptoms. ?You cough up pus. ?Your cough does not get better after 2-3 weeks, or your  cough gets worse. ?Cough medicine does not help your cough and you are not sleeping well. ?You have pain that gets worse or pain that is not helped with medicine. ?You have a fever. ?You are losing weight and you do not know why. ?You have night sweats. ?Get help right away if: ?You cough up blood. ?You have trouble breathing. ?Your heartbeat is very fast. ?These symptoms may be an emergency. Do not wait to see if the symptoms will go away. Get medical help right away. Call your local emergency services (911 in the [place] Do not drive yourself to the  hospital. ?Summary ?A cough helps to clear your throat and lungs. Many things can cause a cough. ?Take over-the-counter and prescription medicines only as told by your doctor. ?Always cover your mouth when you cough.

## 2021-07-10 ENCOUNTER — Encounter: Payer: Self-pay | Admitting: Physician Assistant

## 2021-07-10 ENCOUNTER — Ambulatory Visit (INDEPENDENT_AMBULATORY_CARE_PROVIDER_SITE_OTHER): Payer: Medicare Other | Admitting: Physician Assistant

## 2021-07-10 VITALS — BP 132/82 | HR 75 | Temp 97.8°F | Ht 61.25 in | Wt 142.0 lb

## 2021-07-10 DIAGNOSIS — I471 Supraventricular tachycardia: Secondary | ICD-10-CM | POA: Diagnosis not present

## 2021-07-10 DIAGNOSIS — E7800 Pure hypercholesterolemia, unspecified: Secondary | ICD-10-CM | POA: Diagnosis not present

## 2021-07-10 DIAGNOSIS — I10 Essential (primary) hypertension: Secondary | ICD-10-CM

## 2021-07-10 DIAGNOSIS — R7303 Prediabetes: Secondary | ICD-10-CM

## 2021-07-10 NOTE — Patient Instructions (Signed)
Hypertension, Adult ?Hypertension is another name for high blood pressure. High blood pressure forces your heart to work harder to pump blood. This can cause problems over time. ?There are two numbers in a blood pressure reading. There is a top number (systolic) over a bottom number (diastolic). It is best to have a blood pressure that is below 120/80. ?What are the causes? ?The cause of this condition is not known. Some other conditions can lead to high blood pressure. ?What increases the risk? ?Some lifestyle factors can make you more likely to develop high blood pressure: ?Smoking. ?Not getting enough exercise or physical activity. ?Being overweight. ?Having too much fat, sugar, calories, or salt (sodium) in your diet. ?Drinking too much alcohol. ?Other risk factors include: ?Having any of these conditions: ?Heart disease. ?Diabetes. ?High cholesterol. ?Kidney disease. ?Obstructive sleep apnea. ?Having a family history of high blood pressure and high cholesterol. ?Age. The risk increases with age. ?Stress. ?What are the signs or symptoms? ?High blood pressure may not cause symptoms. Very high blood pressure (hypertensive crisis) may cause: ?Headache. ?Fast or uneven heartbeats (palpitations). ?Shortness of breath. ?Nosebleed. ?Vomiting or feeling like you may vomit (nauseous). ?Changes in how you see. ?Very bad chest pain. ?Feeling dizzy. ?Seizures. ?How is this treated? ?This condition is treated by making healthy lifestyle changes, such as: ?Eating healthy foods. ?Exercising more. ?Drinking less alcohol. ?Your doctor may prescribe medicine if lifestyle changes do not help enough and if: ?Your top number is above 130. ?Your bottom number is above 80. ?Your personal target blood pressure may vary. ?Follow these instructions at home: ?Eating and drinking ? ?If told, follow the DASH eating plan. To follow this plan: ?Fill one half of your plate at each meal with fruits and vegetables. ?Fill one fourth of your plate  at each meal with whole grains. Whole grains include whole-wheat pasta, brown rice, and whole-grain bread. ?Eat or drink low-fat dairy products, such as skim milk or low-fat yogurt. ?Fill one fourth of your plate at each meal with low-fat (lean) proteins. Low-fat proteins include fish, chicken without skin, eggs, beans, and tofu. ?Avoid fatty meat, cured and processed meat, or chicken with skin. ?Avoid pre-made or processed food. ?Limit the amount of salt in your diet to less than 1,500 mg each day. ?Do not drink alcohol if: ?Your doctor tells you not to drink. ?You are pregnant, may be pregnant, or are planning to become pregnant. ?If you drink alcohol: ?Limit how much you have to: ?0-1 drink a day for women. ?0-2 drinks a day for men. ?Know how much alcohol is in your drink. In the [place] one drink equals one 12 oz bottle of beer (355 mL), one 5 oz glass of wine (148 mL), or one 1? oz glass of hard liquor (44 mL). ?Lifestyle ? ?Work with your doctor to stay at a healthy weight or to lose weight. Ask your doctor what the best weight is for you. ?Get at least 30 minutes of exercise that causes your heart to beat faster (aerobic exercise) most days of the week. This may include walking, swimming, or biking. ?Get at least 30 minutes of exercise that strengthens your muscles (resistance exercise) at least 3 days a week. This may include lifting weights or doing Pilates. ?Do not smoke or use any products that contain nicotine or tobacco. If you need help quitting, ask your doctor. ?Check your blood pressure at home as told by your doctor. ?Keep all follow-up visits. ?Medicines ?Take over-the-counter and prescription medicines   only as told by your doctor. Follow directions carefully. ?Do not skip doses of blood pressure medicine. The medicine does not work as well if you skip doses. Skipping doses also puts you at risk for problems. ?Ask your doctor about side effects or reactions to medicines that you should watch  for. ?Contact a doctor if: ?You think you are having a reaction to the medicine you are taking. ?You have headaches that keep coming back. ?You feel dizzy. ?You have swelling in your ankles. ?You have trouble with your vision. ?Get help right away if: ?You get a very bad headache. ?You start to feel mixed up (confused). ?You feel weak or numb. ?You feel faint. ?You have very bad pain in your: ?Chest. ?Belly (abdomen). ?You vomit more than once. ?You have trouble breathing. ?These symptoms may be an emergency. Get help right away. Call 911. ?Do not wait to see if the symptoms will go away. ?Do not drive yourself to the hospital. ?Summary ?Hypertension is another name for high blood pressure. ?High blood pressure forces your heart to work harder to pump blood. ?For most people, a normal blood pressure is less than 120/80. ?Making healthy choices can help lower blood pressure. If your blood pressure does not get lower with healthy choices, you may need to take medicine. ?This information is not intended to replace advice given to you by your health care provider. Make sure you discuss any questions you have with your health care provider. ?Document Revised: 10/31/2020 Document Reviewed: 10/31/2020 ?Elsevier Patient Education ? 2023 [employer]vier Inc. ? ?

## 2021-07-10 NOTE — Assessment & Plan Note (Signed)
-  Last A1c 5.8, advised patient to schedule lab visit for fasting labs including A1c. Continue with low carbohydrate/glucose diet. Will continue to monitor.

## 2021-07-10 NOTE — Progress Notes (Signed)
Established patient visit   Patient: Brittany Harvey   DOB: [birth year]   76 y.o. Female  MRN: [id number] Visit Date: 07/10/2021  No chief complaint on file.  Subjective    HPI  Patient presents for chronic follow-up.   Prediabetes: Pt denies increased urination or thirst. Pt reports continues to watch her sugar intake.   HTN: Pt denies chest pain, palpitations, dizziness or lower extremity swelling. Taking medication as directed without side effects. Checks BP at home and average readings range <140/90.   HLD: Pt managing with diet. Previously did not tolerate statin therapy (rosuvastatin).    Medications: Outpatient Medications Prior to Visit  Medication Sig   Apple Cider Vinegar 500 MG TABS apple cider vinegar   aspirin EC 81 MG tablet Take 81 mg by mouth daily.   Calcium Citrate-Vitamin D (CALCIUM CITRATE + D PO) Take 1 tablet by mouth daily.   Cholecalciferol (VITAMIN D3) 1000 units CAPS Take 5 capsules by mouth daily. Told pt to increase to 5,000 IU per day   Cranberry 1000 MG CAPS Take 1 capsule by mouth daily.   fluticasone (FLONASE) 50 MCG/ACT nasal spray Place 1 spray into both nostrils 2 (two) times daily.   ibuprofen (ADVIL,MOTRIN) 600 MG tablet Take 1 tablet (600 mg total) by mouth every 8 (eight) hours as needed.   metoprolol succinate (TOPROL-XL) 25 MG 24 hr tablet Take 1.5 tablets (37.5 mg total) by mouth daily.   Multiple Vitamin (MULTIVITAMIN WITH MINERALS) TABS tablet Take 1 tablet by mouth daily. Centrum Silver   ondansetron (ZOFRAN) 4 MG tablet Take 1 tablet (4 mg total) by mouth every 8 (eight) hours as needed for nausea or vomiting.   triamcinolone cream (KENALOG) 0.1 % Apply 1 application topically daily as needed (itching/eczema).   [DISCONTINUED] methylPREDNISolone (MEDROL DOSEPAK) 4 MG TBPK tablet Take as directed on package.   No facility-administered medications prior to visit.    Review of Systems Review of Systems:  A fourteen system review of  systems was performed and found to be positive as per HPI.   Last CBC Lab Results  Component Value Date   WBC 6.8 04/28/2021   HGB 13.8 04/28/2021   HCT 40.0 04/28/2021   MCV 88 04/28/2021   MCH 30.2 04/28/2021   RDW 12.7 04/28/2021   PLT 316 85/46/2703   Last metabolic panel Lab Results  Component Value Date   GLUCOSE 88 04/28/2021   NA 142 04/28/2021   K 3.8 04/28/2021   CL 104 04/28/2021   CO2 23 04/28/2021   BUN 8 04/28/2021   CREATININE 0.69 04/28/2021   EGFR 91 04/28/2021   CALCIUM 9.8 04/28/2021   PHOS 2.9 08/14/2016   PROT 7.5 04/28/2021   ALBUMIN 4.6 04/28/2021   LABGLOB 2.9 04/28/2021   AGRATIO 1.6 04/28/2021   BILITOT 0.5 04/28/2021   ALKPHOS 84 04/28/2021   AST 24 04/28/2021   ALT 18 04/28/2021   ANIONGAP 12 09/09/2014   Last lipids Lab Results  Component Value Date   CHOL 216 (H) 10/02/2020   HDL 83 10/02/2020   LDLCALC 118 (H) 10/02/2020   TRIG 84 10/02/2020   CHOLHDL 2.6 10/02/2020   Last hemoglobin A1c Lab Results  Component Value Date   HGBA1C 5.8 (H) 10/02/2020   Last thyroid functions Lab Results  Component Value Date   TSH 1.350 12/29/2019   T3TOTAL 112 01/12/2019   Last vitamin D Lab Results  Component Value Date   VD25OH 47.7 12/29/2019   Last vitamin  B12 and Folate Lab Results  Component Value Date   VITAMINB12 821 11/18/2017   FOLATE >20.0 08/14/2016     Objective    BP 132/82   Pulse 75   Temp 97.8 F (36.6 C)   Ht 5' 1.25" (1.556 m)   Wt 142 lb (64.4 kg)   SpO2 96%   BMI 26.61 kg/m  BP Readings from Last 3 Encounters:  07/10/21 132/82  06/02/21 122/64  04/28/21 140/68   Wt Readings from Last 3 Encounters:  07/10/21 142 lb (64.4 kg)  06/02/21 141 lb (64 kg)  04/28/21 140 lb (63.5 kg)    Physical Exam  General: Pleasant and cooperative, appropriate for stated age.  Neuro:  Alert and oriented,  extra-ocular muscles intact  HEENT:  Normocephalic, atraumatic, neck supple  Skin:  no gross rash, warm,  pink. Cardiac:  RRR, S1 S2 Respiratory: CTA B/L  Vascular:  Ext warm, no cyanosis apprec.; cap RF less 2 sec. No edema. Psych:  No HI/SI, judgement and insight good, Euthymic mood. Full Affect.   No results found for any visits on 07/10/21.  Assessment & Plan      Problem List Items Addressed This Visit       Cardiovascular and Mediastinum   PSVT (paroxysmal supraventricular tachycardia) (HCC) (Chronic)    -Followed by cardiology. -Patient on beta blocker.      White coat syndrome with hypertension (Chronic)    -BP stable in office today. -Continue metoprolol succinate 37.5 mg. -Will continue to monitor.        Other   Hyperlipidemia (Chronic)    -The 10-year ASCVD risk score (Arnett DK, et al., 2019) is: 18.5% -Patient managing with diet and lifestyle interventions. Not interested on trying different statin medication. Discussed low fat diet. Will repeat lipid panel with fasting lab visit.      Prediabetes - Primary (Chronic)    -Last A1c 5.8, advised patient to schedule lab visit for fasting labs including A1c. Continue with low carbohydrate/glucose diet. Will continue to monitor.        Return in about 6 months (around 01/09/2022) for [facility]; lab visit in 1-4 weeks for FBW.        Lorrene Reid, PA-C  [facility] [phone] (phone) [phone] (fax)  [place]

## 2021-07-10 NOTE — Assessment & Plan Note (Signed)
-  Followed by cardiology. -Patient on beta blocker.

## 2021-07-10 NOTE — Assessment & Plan Note (Signed)
-  The 10-year ASCVD risk score (Arnett DK, et al., 2019) is: 18.5% -Patient managing with diet and lifestyle interventions. Not interested on trying different statin medication. Discussed low fat diet. Will repeat lipid panel with fasting lab visit.

## 2021-07-10 NOTE — Assessment & Plan Note (Signed)
-  BP stable in office today. -Continue metoprolol succinate 37.5 mg. -Will continue to monitor.

## 2021-07-23 ENCOUNTER — Encounter: Payer: Self-pay | Admitting: Physician Assistant

## 2021-07-23 DIAGNOSIS — K635 Polyp of colon: Secondary | ICD-10-CM | POA: Diagnosis not present

## 2021-07-23 DIAGNOSIS — D122 Benign neoplasm of ascending colon: Secondary | ICD-10-CM | POA: Diagnosis not present

## 2021-07-23 DIAGNOSIS — D124 Benign neoplasm of descending colon: Secondary | ICD-10-CM | POA: Diagnosis not present

## 2021-07-23 DIAGNOSIS — Z1211 Encounter for screening for malignant neoplasm of colon: Secondary | ICD-10-CM | POA: Diagnosis not present

## 2021-07-23 DIAGNOSIS — Z86010 Personal history of colonic polyps: Secondary | ICD-10-CM | POA: Diagnosis not present

## 2021-07-23 DIAGNOSIS — K5730 Diverticulosis of large intestine without perforation or abscess without bleeding: Secondary | ICD-10-CM | POA: Diagnosis not present

## 2021-07-23 LAB — HM COLONOSCOPY

## 2021-07-31 ENCOUNTER — Other Ambulatory Visit: Payer: Self-pay | Admitting: Physician Assistant

## 2021-07-31 DIAGNOSIS — E7889 Other lipoprotein metabolism disorders: Secondary | ICD-10-CM

## 2021-07-31 DIAGNOSIS — Z0000 Encounter for general adult medical examination without abnormal findings: Secondary | ICD-10-CM

## 2021-07-31 DIAGNOSIS — I10 Essential (primary) hypertension: Secondary | ICD-10-CM

## 2021-07-31 DIAGNOSIS — E7800 Pure hypercholesterolemia, unspecified: Secondary | ICD-10-CM

## 2021-07-31 DIAGNOSIS — R7303 Prediabetes: Secondary | ICD-10-CM

## 2021-07-31 DIAGNOSIS — E559 Vitamin D deficiency, unspecified: Secondary | ICD-10-CM

## 2021-07-31 DIAGNOSIS — E785 Hyperlipidemia, unspecified: Secondary | ICD-10-CM

## 2021-08-01 ENCOUNTER — Encounter: Payer: Self-pay | Admitting: Physician Assistant

## 2021-08-07 ENCOUNTER — Other Ambulatory Visit: Payer: Medicare Other

## 2021-08-07 DIAGNOSIS — Z0000 Encounter for general adult medical examination without abnormal findings: Secondary | ICD-10-CM

## 2021-08-07 DIAGNOSIS — E7800 Pure hypercholesterolemia, unspecified: Secondary | ICD-10-CM | POA: Diagnosis not present

## 2021-08-07 DIAGNOSIS — E785 Hyperlipidemia, unspecified: Secondary | ICD-10-CM

## 2021-08-07 DIAGNOSIS — E559 Vitamin D deficiency, unspecified: Secondary | ICD-10-CM

## 2021-08-07 DIAGNOSIS — I10 Essential (primary) hypertension: Secondary | ICD-10-CM

## 2021-08-07 DIAGNOSIS — E7889 Other lipoprotein metabolism disorders: Secondary | ICD-10-CM | POA: Diagnosis not present

## 2021-08-07 DIAGNOSIS — R7303 Prediabetes: Secondary | ICD-10-CM | POA: Diagnosis not present

## 2021-08-08 LAB — COMPREHENSIVE METABOLIC PANEL
ALT: 14 IU/L (ref 0–32)
AST: 16 IU/L (ref 0–40)
Albumin/Globulin Ratio: 2 (ref 1.2–2.2)
Albumin: 4.4 g/dL (ref 3.8–4.8)
Alkaline Phosphatase: 70 IU/L (ref 44–121)
BUN/Creatinine Ratio: 25 (ref 12–28)
BUN: 16 mg/dL (ref 8–27)
Bilirubin Total: 0.6 mg/dL (ref 0.0–1.2)
CO2: 22 mmol/L (ref 20–29)
Calcium: 9.3 mg/dL (ref 8.7–10.3)
Chloride: 106 mmol/L (ref 96–106)
Creatinine, Ser: 0.65 mg/dL (ref 0.57–1.00)
Globulin, Total: 2.2 g/dL (ref 1.5–4.5)
Glucose: 87 mg/dL (ref 70–99)
Potassium: 4.1 mmol/L (ref 3.5–5.2)
Sodium: 142 mmol/L (ref 134–144)
Total Protein: 6.6 g/dL (ref 6.0–8.5)
eGFR: 92 mL/min/{1.73_m2} (ref >59)

## 2021-08-08 LAB — HEMOGLOBIN A1C
Est. average glucose Bld gHb Est-mCnc: 111 mg/dL
Hgb A1c MFr Bld: 5.5 % (ref 4.8–5.6)

## 2021-08-08 LAB — LIPID PANEL
Chol/HDL Ratio: 2.2 ratio (ref 0.0–4.4)
Cholesterol, Total: 209 mg/dL — ABNORMAL HIGH (ref 100–199)
HDL: 93 mg/dL (ref >39)
LDL Chol Calc (NIH): 105 mg/dL — ABNORMAL HIGH (ref 0–99)
Triglycerides: 64 mg/dL (ref 0–149)
VLDL Cholesterol Cal: 11 mg/dL (ref 5–40)

## 2021-08-08 LAB — CBC
Hematocrit: 37.3 % (ref 34.0–46.6)
Hemoglobin: 12.8 g/dL (ref 11.1–15.9)
MCH: 30 pg (ref 26.6–33.0)
MCHC: 34.3 g/dL (ref 31.5–35.7)
MCV: 87 fL (ref 79–97)
Platelets: 267 10*3/uL (ref 150–450)
RBC: 4.27 x10E6/uL (ref 3.77–5.28)
RDW: 13.1 % (ref 11.7–15.4)
WBC: 6.7 10*3/uL (ref 3.4–10.8)

## 2021-08-08 LAB — TSH: TSH: 1.51 u[IU]/mL (ref 0.450–4.500)

## 2021-08-08 LAB — VITAMIN D 25 HYDROXY (VIT D DEFICIENCY, FRACTURES): Vit D, 25-Hydroxy: 52.9 ng/mL (ref 30.0–100.0)

## 2021-08-11 ENCOUNTER — Telehealth: Payer: Self-pay | Admitting: Physician Assistant

## 2021-08-11 NOTE — Telephone Encounter (Signed)
Patient called back, stated she thinks you had called her on Friday about lab results and was returning your call. AMUCK

## 2021-11-03 DIAGNOSIS — Z23 Encounter for immunization: Secondary | ICD-10-CM | POA: Diagnosis not present

## 2021-11-18 ENCOUNTER — Encounter: Payer: Self-pay | Admitting: Cardiovascular Disease

## 2021-11-18 ENCOUNTER — Ambulatory Visit: Payer: Medicare Other | Attending: Cardiovascular Disease | Admitting: Cardiovascular Disease

## 2021-11-18 DIAGNOSIS — I471 Supraventricular tachycardia, unspecified: Secondary | ICD-10-CM | POA: Diagnosis not present

## 2021-11-18 DIAGNOSIS — E782 Mixed hyperlipidemia: Secondary | ICD-10-CM | POA: Insufficient documentation

## 2021-11-18 DIAGNOSIS — I10 Essential (primary) hypertension: Secondary | ICD-10-CM | POA: Insufficient documentation

## 2021-11-18 NOTE — Progress Notes (Signed)
11/18/2021 Brittany Harvey   17-Jul-1945  [id number]  Primary Physician Lorrene Reid, PA-C Primary Cardiologist: Lorretta Harp MD [facility], [place], [place]  HPI:  Brittany Harvey is a 76 y.o.  moderately overweight widowed Caucasian female who I last saw 11/08/2018. She saw Dr. Sallyanne Kuster back in 2011. She has a history of PSVT remotely back in 2004. She had a false positive GXT back in 2011 which led to a Myoview which was normal. She was admitted to the hospital back in March after I saw her in the office for chest pain/rule out myocardial infarction. A stress MR I was completely normal. She said since and no recurrent symptoms. The presumed etiology of his of her chest pain was reflux. She saw [name] in the office 11/27/13 after she had been seen at [facility] emergency room for PSVT requiring adenosine conversion. She's had no recurrent symptoms.   Since I saw her in the office 3 years ago she continues to do well.  She is very active working as a Editor, commissioning.  She denies chest pain or shortness of breath.  She did have a normal 2D echo and Myoview stress test performed October 2019.   Current Meds  Medication Sig   Apple Cider Vinegar 500 MG TABS apple cider vinegar   aspirin EC 81 MG tablet Take 81 mg by mouth daily.   Calcium Citrate-Vitamin D (CALCIUM CITRATE + D PO) Take 1 tablet by mouth daily.   Cholecalciferol (VITAMIN D3) 1000 units CAPS Take 5 capsules by mouth daily. Told pt to increase to 5,000 IU per day   Cranberry 1000 MG CAPS Take 1 capsule by mouth daily.   metoprolol succinate (TOPROL-XL) 25 MG 24 hr tablet Take 1.5 tablets (37.5 mg total) by mouth daily.   Multiple Vitamin (MULTIVITAMIN WITH MINERALS) TABS tablet Take 1 tablet by mouth daily. Centrum Silver     Allergies  Allergen Reactions   Clindamycin/Lincomycin Nausea Only   Penicillins Anaphylaxis and Swelling   Tussin Dm [Guaifenesin-Dm] Other (See Comments)    Dizziness/ Lightheadness    Zithromax [Azithromycin] Other (See Comments)    Pre-syncope   Albuterol Palpitations    Social History   Socioeconomic History   Marital status: Married    Spouse name: Not on file   Number of children: Not on file   Years of education: Not on file   Highest education level: Not on file  Occupational History   Not on file  Tobacco Use   Smoking status: Former    Types: Cigarettes    Quit date: 01/27/1992    Years since quitting: 29.8   Smokeless tobacco: Never  Vaping Use   Vaping Use: Never used  Substance and Sexual Activity   Alcohol use: No   Drug use: No   Sexual activity: Never    Birth control/protection: None  Other Topics Concern   Not on file  Social History Narrative   Not on file   Social Determinants of Health   Financial Resource Strain: Not on file  Food Insecurity: Not on file  Transportation Needs: Not on file  Physical Activity: Not on file  Stress: Not on file  Social Connections: Not on file  Intimate Partner Violence: Not on file     Review of Systems: General: negative for chills, fever, night sweats or weight changes.  Cardiovascular: negative for chest pain, dyspnea on exertion, edema, orthopnea, palpitations, paroxysmal nocturnal dyspnea or shortness of breath  Dermatological: negative for rash Respiratory: negative for cough or wheezing Urologic: negative for hematuria Abdominal: negative for nausea, vomiting, diarrhea, bright red blood per rectum, melena, or hematemesis Neurologic: negative for visual changes, syncope, or dizziness All other systems reviewed and are otherwise negative except as noted above.    Blood pressure 124/66, pulse 67, height '5\' 1"'$  (1.549 m), weight 142 lb (64.4 kg), SpO2 95 %.  General appearance: alert and no distress Neck: no adenopathy, no carotid bruit, no JVD, supple, symmetrical, trachea midline, and thyroid not enlarged, symmetric, no tenderness/mass/nodules Lungs: clear to auscultation  bilaterally Heart: regular rate and rhythm, S1, S2 normal, no murmur, click, rub or gallop Extremities: extremities normal, atraumatic, no cyanosis or edema Pulses: 2+ and symmetric Skin: Skin color, texture, turgor normal. No rashes or lesions Neurologic: Grossly normal  EKG normal sinus rhythm at 67 without ST or T wave changes.  Personally reviewed this EKG.  ASSESSMENT AND PLAN:   PSVT (paroxysmal supraventricular tachycardia) (HCC) History of PSVT in the past but none since I saw her.  She is on metoprolol.  Hyperlipidemia History of hyperlipidemia not on statin therapy with lipid profile performed 08/07/2021 revealing total cholesterol 209, LDL of 105 and HDL of 93.  Hypertensive disorder History of essential hypertension with blood pressure measured today at 124/66.  She is on metoprolol.     Lorretta Harp MD FACP,FACC,FAHA, [facility] 11/18/2021 9:32 AM

## 2021-11-18 NOTE — Assessment & Plan Note (Signed)
History of PSVT in the past but none since I saw her.  She is on metoprolol.

## 2021-11-18 NOTE — Assessment & Plan Note (Signed)
History of hyperlipidemia not on statin therapy with lipid profile performed 08/07/2021 revealing total cholesterol 209, LDL of 105 and HDL of 93.

## 2021-11-18 NOTE — Assessment & Plan Note (Signed)
History of essential hypertension with blood pressure measured today at 124/66.  She is on metoprolol.

## 2021-11-18 NOTE — Patient Instructions (Signed)
Medication Instructions:  Your physician recommends that you continue on your current medications as directed. Please refer to the Current Medication list given to you today.  *If you need a refill on your cardiac medications before your next appointment, please call your pharmacy*   Follow-Up: At [facility], you and your health needs are our priority.  As part of our continuing mission to provide you with exceptional heart care, we have created designated Provider Care Teams.  These Care Teams include your primary Cardiologist (physician) and Advanced Practice Providers (APPs -  Physician Assistants and Nurse Practitioners) who all work together to provide you with the care you need, when you need it.  We recommend signing up for the patient portal called "MyChart".  Sign up information is provided on this After Visit Summary.  MyChart is used to connect with patients for Virtual Visits (Telemedicine).  Patients are able to view lab/test results, encounter notes, upcoming appointments, etc.  Non-urgent messages can be sent to your provider as well.   To learn more about what you can do with MyChart, go to [email or web address].    Your next appointment:   We will see you on an as needed basis.  Provider:   Jonathan Berry, MD  

## 2022-02-02 DIAGNOSIS — Z01419 Encounter for gynecological examination (general) (routine) without abnormal findings: Secondary | ICD-10-CM | POA: Diagnosis not present

## 2022-02-02 DIAGNOSIS — R29890 Loss of height: Secondary | ICD-10-CM | POA: Diagnosis not present

## 2022-02-02 DIAGNOSIS — M8588 Other specified disorders of bone density and structure, other site: Secondary | ICD-10-CM | POA: Diagnosis not present

## 2022-02-02 DIAGNOSIS — M13851 Other specified arthritis, right hip: Secondary | ICD-10-CM | POA: Diagnosis not present

## 2022-02-02 DIAGNOSIS — Z1231 Encounter for screening mammogram for malignant neoplasm of breast: Secondary | ICD-10-CM | POA: Diagnosis not present

## 2022-02-02 DIAGNOSIS — Z6827 Body mass index (BMI) 27.0-27.9, adult: Secondary | ICD-10-CM | POA: Diagnosis not present

## 2022-02-02 DIAGNOSIS — N952 Postmenopausal atrophic vaginitis: Secondary | ICD-10-CM | POA: Diagnosis not present

## 2022-02-02 DIAGNOSIS — N958 Other specified menopausal and perimenopausal disorders: Secondary | ICD-10-CM | POA: Diagnosis not present

## 2022-02-02 LAB — HM DEXA SCAN

## 2022-02-02 LAB — HM MAMMOGRAPHY

## 2022-03-04 ENCOUNTER — Other Ambulatory Visit: Payer: Self-pay | Admitting: Cardiovascular Disease

## 2022-05-14 ENCOUNTER — Telehealth: Payer: Self-pay

## 2022-05-14 NOTE — Telephone Encounter (Signed)
Called patient to schedule Medicare Annual Wellness Visit (AWV). Unable to reach patient.  Last date of AWV: 01/09/21  Please schedule an appointment at any time on Annual Wellness Schedule.

## 2022-05-26 ENCOUNTER — Other Ambulatory Visit: Payer: Medicare Other

## 2022-05-26 DIAGNOSIS — Z1329 Encounter for screening for other suspected endocrine disorder: Secondary | ICD-10-CM

## 2022-05-26 DIAGNOSIS — R634 Abnormal weight loss: Secondary | ICD-10-CM

## 2022-05-26 DIAGNOSIS — E559 Vitamin D deficiency, unspecified: Secondary | ICD-10-CM

## 2022-05-27 LAB — T3, FREE: T3, Free: 2.6 pg/mL (ref 2.0–4.4)

## 2022-05-27 LAB — TSH: TSH: 2.02 u[IU]/mL (ref 0.450–4.500)

## 2022-05-27 LAB — VITAMIN D 25 HYDROXY (VIT D DEFICIENCY, FRACTURES): Vit D, 25-Hydroxy: 63.3 ng/mL (ref 30.0–100.0)

## 2022-05-27 LAB — T4: T4, Total: 6.3 ug/dL (ref 4.5–12.0)

## 2022-06-25 ENCOUNTER — Ambulatory Visit (INDEPENDENT_AMBULATORY_CARE_PROVIDER_SITE_OTHER): Payer: Medicare Other

## 2022-06-25 VITALS — Ht 61.0 in | Wt 142.0 lb

## 2022-06-25 DIAGNOSIS — Z0000 Encounter for general adult medical examination without abnormal findings: Secondary | ICD-10-CM

## 2022-06-25 NOTE — Progress Notes (Signed)
Subjective:   Brittany Harvey is a 77 y.o. female who presents for Medicare Annual (Subsequent) preventive examination.  Review of Systems    Virtual Visit via Telephone Note  I connected with  Brittany Harvey on 06/25/22 at  3:30 PM EDT by telephone and verified that I am speaking with the correct person using two identifiers.  Location: Patient: Home Provider: Office Persons participating in the virtual visit: patient/Nurse Health Advisor   I discussed the limitations, risks, security and privacy concerns of performing an evaluation and management service by telephone and the availability of in person appointments. The patient expressed understanding and agreed to proceed.  Interactive audio and video telecommunications were attempted between this nurse and patient, however failed, due to patient having technical difficulties OR patient did not have access to video capability.  We continued and completed visit with audio only.  Some vital signs may be absent or patient reported.   [name], LPN  Cardiac Risk Factors include: advanced age (>40men, >11 women);hypertension     Objective:    Today's Vitals   06/25/22 1535  Weight: 142 lb (64.4 kg)  Height: 5\' 1"  (1.549 m)   Body mass index is 26.83 kg/m.     06/25/2022    3:42 PM 08/12/2016    1:53 PM 08/20/2015    3:24 PM 09/09/2014    5:14 PM 11/19/2013    2:20 PM 11/19/2013   10:33 AM 04/26/2013    1:10 AM  Advanced Directives  Does Patient Have a Medical Advance Directive? Yes Yes Yes No No No Patient has advance directive, copy not in chart  Type of Advance Directive Healthcare Power of [place];Living will Healthcare Power of [employer] Power of [employer] Power of [place];Living will  Copy of Healthcare Power of Attorney in Chart? No - copy requested        Would patient like information on creating a medical advance directive?     No - patient declined information No - patient declined  information     Current Medications (verified) Outpatient Encounter Medications as of 06/25/2022  Medication Sig   Apple Cider Vinegar 500 MG TABS apple cider vinegar   aspirin EC 81 MG tablet Take 81 mg by mouth daily.   Calcium Citrate-Vitamin D (CALCIUM CITRATE + D PO) Take 1 tablet by mouth daily.   Cholecalciferol (VITAMIN D3) 1000 units CAPS Take 5 capsules by mouth daily. Told pt to increase to 5,000 IU per day   Cranberry 1000 MG CAPS Take 1 capsule by mouth daily.   fluticasone (FLONASE) 50 MCG/ACT nasal spray Place 1 spray into both nostrils 2 (two) times daily. (Patient not taking: Reported on 11/18/2021)   ibuprofen (ADVIL,MOTRIN) 600 MG tablet Take 1 tablet (600 mg total) by mouth every 8 (eight) hours as needed. (Patient not taking: Reported on 11/18/2021)   metoprolol succinate (TOPROL-XL) 25 MG 24 hr tablet TAKE 1 AND 1/2 TABLETS EVERY DAY   Multiple Vitamin (MULTIVITAMIN WITH MINERALS) TABS tablet Take 1 tablet by mouth daily. Centrum Silver   ondansetron (ZOFRAN) 4 MG tablet Take 1 tablet (4 mg total) by mouth every 8 (eight) hours as needed for nausea or vomiting. (Patient not taking: Reported on 11/18/2021)   triamcinolone cream (KENALOG) 0.1 % Apply 1 application topically daily as needed (itching/eczema). (Patient not taking: Reported on 11/18/2021)   No facility-administered encounter medications on file as of 06/25/2022.    Allergies (verified) Clindamycin/lincomycin, Penicillins, Tussin dm [  guaifenesin-dm], Zithromax [azithromycin], and Albuterol   History: Past Medical History:  Diagnosis Date   Anxiety    Atypical chest pain    GERD (gastroesophageal reflux disease)    Heart palpitations    Hyperlipidemia    Hypertension    Kidney stones    Prediabetes    PSVT (paroxysmal supraventricular tachycardia)    Past Surgical History:  Procedure Laterality Date   CARDIOVASCULAR STRESS TEST  06/19/2009   Post stress myocardial perfusion images show a normal  pattern of perfusion in all regions. ECG positive for ischemia. Appears to be a "false-positive" ECG stress test.   TUBAL LIGATION     Family History  Adopted: Yes   Social History   Socioeconomic History   Marital status: Married    Spouse name: Not on file   Number of children: Not on file   Years of education: Not on file   Highest education level: Not on file  Occupational History   Not on file  Tobacco Use   Smoking status: Former    Types: Cigarettes    Quit date: 01/27/1992    Years since quitting: 30.4   Smokeless tobacco: Never  Vaping Use   Vaping Use: Never used  Substance and Sexual Activity   Alcohol use: No   Drug use: No   Sexual activity: Never    Birth control/protection: None  Other Topics Concern   Not on file  Social History Narrative   Not on file   Social Determinants of Health   Financial Resource Strain: Low Risk  (06/25/2022)   Overall Financial Resource Strain (CARDIA)    Difficulty of Paying Living Expenses: Not hard at all  Food Insecurity: No Food Insecurity (06/25/2022)   Hunger Vital Sign    Worried About Running Out of Food in the Last Year: Never true    Ran Out of Food in the Last Year: Never true  Transportation Needs: No Transportation Needs (06/25/2022)   PRAPARE - Administrator, Civil Service (Medical): No    Lack of Transportation (Non-Medical): No  Physical Activity: Sufficiently Active (06/25/2022)   Exercise Vital Sign    Days of Exercise per Week: 5 days    Minutes of Exercise per Session: 150+ min  Stress: No Stress Concern Present (06/25/2022)   [employer] of Occupational Health - Occupational Stress Questionnaire    Feeling of Stress : Not at all  Social Connections: Socially Isolated (06/25/2022)   Social Connection and Isolation Panel [NHANES]    Frequency of Communication with Friends and Family: Patient unable to answer    Frequency of Social Gatherings with Friends and Family: Patient unable to  answer    Attends Religious Services: Never    Database administrator or Organizations: No    Attends Banker Meetings: Never    Marital Status: Widowed    Tobacco Counseling Counseling given: Not Answered   Clinical Intake:  Pre-visit preparation completed: No  Pain : No/denies pain     BMI - recorded: 26.83 Nutritional Status: BMI 25 -29 Overweight Nutritional Risks: None Diabetes: No  How often do you need to have someone help you when you read instructions, pamphlets, or other written materials from your doctor or pharmacy?: 1 - Never  Diabetic?  No  Interpreter Needed?: No  Information entered by :: Theresa Mulligan LPN   Activities of Daily Living    06/25/2022    3:41 PM 07/10/2021    3:23 PM  In your present state of health, do you have any difficulty performing the following activities:  Hearing? 0 0  Vision? 0 0  Difficulty concentrating or making decisions? 0 0  Walking or climbing stairs? 0 0  Dressing or bathing? 0 0  Doing errands, shopping? 0 0  Preparing Food and eating ? N   Using the Toilet? N   In the past six months, have you accidently leaked urine? N   Do you have problems with loss of bowel control? N   Managing your Medications? N   Managing your Finances? N   Housekeeping or managing your Housekeeping? N     Patient Care Team: [name], PA as PCP - General (Family Medicine) Runell Gess, MD as PCP - Cardiology (Cardiology) Charna Elizabeth, MD as Consulting Physician (Gastroenterology) Runell Gess, MD as Consulting Physician (Cardiology) Harold Hedge, MD as Consulting Physician (Obstetrics and Gynecology)  Indicate any recent Medical Services you may have received from other than [facility] providers in the past year (date may be approximate).     Assessment:   This is a routine wellness examination for Raeanne.  Hearing/Vision screen Hearing Screening - Comments:: Denies hearing difficulties   Vision  Screening - Comments:: Wears rx glasses - up to date with routine eye exams with  Dr Fenton Malling  Dietary issues and exercise activities discussed: Exercise limited by: None identified   Goals Addressed               This Visit's Progress     No current goals (pt-stated)         Depression Screen    06/25/2022    3:39 PM 07/10/2021    3:23 PM 06/02/2021    1:13 PM 04/28/2021    3:33 PM 02/10/2021    9:42 AM 01/09/2021    4:23 PM 09/09/2020    3:59 PM  PHQ 2/9 Scores  PHQ - 2 Score 0 0 0 0 0 0 0  PHQ- 9 Score  1 0 0 0 0 0    Fall Risk    06/25/2022    3:41 PM 07/10/2021    3:23 PM 06/02/2021    1:12 PM 04/28/2021    3:32 PM 02/10/2021    9:41 AM  Fall Risk   Falls in the past year? 0 0 0 0 0  Number falls in past yr: 0 0 0 0 0  Injury with Fall? 0 0 0 0 0  Risk for fall due to : No Fall Risks No Fall Risks No Fall Risks No Fall Risks No Fall Risks  Follow up Falls prevention discussed Falls evaluation completed  Falls evaluation completed Falls evaluation completed    FALL RISK PREVENTION PERTAINING TO THE HOME:  Any stairs in or around the home? Yes  If so, are there any without handrails? No  Home free of loose throw rugs in walkways, pet beds, electrical cords, etc? Yes  Adequate lighting in your home to reduce risk of falls? Yes   ASSISTIVE DEVICES UTILIZED TO PREVENT FALLS:  Life alert? No  Use of a cane, walker or w/c? No  Grab bars in the bathroom? Yes Shower chair or bench in shower? No  Elevated toilet seat or a handicapped toilet? No   TIMED UP AND GO:  Was the test performed? No . Audio Visit  Cognitive Function:        06/25/2022    3:42 PM 01/09/2021    4:08 PM 01/03/2020  2:21 PM 01/12/2019   10:49 AM 12/22/2017    2:57 PM  6CIT Screen  What Year? 0 points 0 points 0 points 0 points 0 points  What month? 0 points 0 points 0 points 0 points 0 points  What time? 0 points 0 points 0 points 0 points 0 points  Count back from 20 0 points 0 points 0  points 0 points 0 points  Months in reverse 0 points 2 points 0 points 0 points 0 points  Repeat phrase 0 points 0 points 0 points 6 points 0 points  Total Score 0 points 2 points 0 points 6 points 0 points    Immunizations Immunization History  Administered Date(s) Administered   Influenza, High Dose Seasonal PF 10/31/2015, 10/29/2016, 11/18/2017, 11/02/2019   Influenza-Unspecified 11/04/2018, 10/30/2020, 11/03/2021   Moderna Sars-Covid-2 Vaccination 02/27/2019, 03/27/2019   Pneumococcal Conjugate-13 05/10/2015   Pneumococcal Polysaccharide-23 01/18/2013   Tdap 12/09/2010, 09/30/2012   Zoster Recombinat (Shingrix) 12/22/2017    TDAP status: Up to date  Flu Vaccine status: Up to date  Pneumococcal vaccine status: Up to date  Covid-19 vaccine status: Completed vaccines  Qualifies for Shingles Vaccine? Yes   Zostavax completed Yes   Shingrix Completed?: Yes  Screening Tests Health Maintenance  Topic Date Due   COVID-19 Vaccine (3 - Moderna risk series) 07/11/2022 (Originally 04/24/2019)   Zoster Vaccines- Shingrix (2 of 2) 09/25/2022 (Originally 02/16/2018)   INFLUENZA VACCINE  08/27/2022   DTaP/Tdap/Td (3 - Td or Tdap) 10/01/2022   Medicare Annual Wellness (AWV)  06/25/2023   Colonoscopy  07/24/2031   Pneumonia Vaccine 39+ Years old  Completed   DEXA SCAN  Completed   Hepatitis C Screening  Completed   HPV VACCINES  Aged Out    Health Maintenance  There are no preventive care reminders to display for this patient.   Colorectal cancer screening: Type of screening: Colonoscopy. Completed 07/03/21. Repeat every 10 years  Mammogram status: No longer required due to Age.  Bone Density status: Completed 05/18/16. Results reflect: Bone density results: OSTEOPOROSIS. Repeat every   years.  Lung Cancer Screening: (Low Dose CT Chest recommended if Age 63-80 years, 30 pack-year currently smoking OR have quit w/in 15years.) does not qualify.     Additional  Screening:  Hepatitis C Screening: does qualify; Completed 04/29/19  Vision Screening: Recommended annual ophthalmology exams for early detection of glaucoma and other disorders of the eye. Is the patient up to date with their annual eye exam?  Yes  Who is the provider or what is the name of the office in which the patient attends annual eye exams? Dr Fenton Malling If pt is not established with a provider, would they like to be referred to a provider to establish care? No .   Dental Screening: Recommended annual dental exams for proper oral hygiene  Community Resource Referral / Chronic Care Management:  CRR required this visit?  No   CCM required this visit?  No      Plan:     I have personally reviewed and noted the following in the patient's chart:   Medical and social history Use of alcohol, tobacco or illicit drugs  Current medications and supplements including opioid prescriptions. Patient is not currently taking opioid prescriptions. Functional ability and status Nutritional status Physical activity Advanced directives List of other physicians Hospitalizations, surgeries, and ER visits in previous 12 months Vitals Screenings to include cognitive, depression, and falls Referrals and appointments  In addition, I have reviewed and discussed  with patient certain preventive protocols, quality metrics, and best practice recommendations. A written personalized care plan for preventive services as well as general preventive health recommendations were provided to patient.     [name], LPN   1/61/0960   Nurse Notes: None

## 2022-06-25 NOTE — Progress Notes (Deleted)
Subjective:   Brittany Harvey is a 77 y.o. female who presents for Medicare Annual (Subsequent) preventive examination.  Review of Systems           Objective:    There were no vitals filed for this visit. There is no height or weight on file to calculate BMI.     08/12/2016    1:53 PM 08/20/2015    3:24 PM 09/09/2014    5:14 PM 11/19/2013    2:20 PM 11/19/2013   10:33 AM 04/26/2013    1:10 AM  Advanced Directives  Does Patient Have a Medical Advance Directive? Yes Yes No No No Patient has advance directive, copy not in chart  Type of Advance Directive Healthcare Power of [employer] Power of [employer] Power of [place];Living will  Would patient like information on creating a medical advance directive?    No - patient declined information No - patient declined information     Current Medications (verified) Outpatient Encounter Medications as of 06/25/2022  Medication Sig   Apple Cider Vinegar 500 MG TABS apple cider vinegar   aspirin EC 81 MG tablet Take 81 mg by mouth daily.   Calcium Citrate-Vitamin D (CALCIUM CITRATE + D PO) Take 1 tablet by mouth daily.   Cholecalciferol (VITAMIN D3) 1000 units CAPS Take 5 capsules by mouth daily. Told pt to increase to 5,000 IU per day   Cranberry 1000 MG CAPS Take 1 capsule by mouth daily.   fluticasone (FLONASE) 50 MCG/ACT nasal spray Place 1 spray into both nostrils 2 (two) times daily. (Patient not taking: Reported on 11/18/2021)   ibuprofen (ADVIL,MOTRIN) 600 MG tablet Take 1 tablet (600 mg total) by mouth every 8 (eight) hours as needed. (Patient not taking: Reported on 11/18/2021)   metoprolol succinate (TOPROL-XL) 25 MG 24 hr tablet TAKE 1 AND 1/2 TABLETS EVERY DAY   Multiple Vitamin (MULTIVITAMIN WITH MINERALS) TABS tablet Take 1 tablet by mouth daily. Centrum Silver   ondansetron (ZOFRAN) 4 MG tablet Take 1 tablet (4 mg total) by mouth every 8 (eight) hours as needed for nausea or vomiting. (Patient not taking:  Reported on 11/18/2021)   triamcinolone cream (KENALOG) 0.1 % Apply 1 application topically daily as needed (itching/eczema). (Patient not taking: Reported on 11/18/2021)   No facility-administered encounter medications on file as of 06/25/2022.    Allergies (verified) Clindamycin/lincomycin, Penicillins, Tussin dm [guaifenesin-dm], Zithromax [azithromycin], and Albuterol   History: Past Medical History:  Diagnosis Date   Anxiety    Atypical chest pain    GERD (gastroesophageal reflux disease)    Heart palpitations    Hyperlipidemia    Hypertension    Kidney stones    Prediabetes    PSVT (paroxysmal supraventricular tachycardia)    Past Surgical History:  Procedure Laterality Date   CARDIOVASCULAR STRESS TEST  06/19/2009   Post stress myocardial perfusion images show a normal pattern of perfusion in all regions. ECG positive for ischemia. Appears to be a "false-positive" ECG stress test.   TUBAL LIGATION     Family History  Adopted: Yes   Social History   Socioeconomic History   Marital status: Married    Spouse name: Not on file   Number of children: Not on file   Years of education: Not on file   Highest education level: Not on file  Occupational History   Not on file  Tobacco Use   Smoking status: Former    Types: Cigarettes  Quit date: 01/27/1992    Years since quitting: 30.4   Smokeless tobacco: Never  Vaping Use   Vaping Use: Never used  Substance and Sexual Activity   Alcohol use: No   Drug use: No   Sexual activity: Never    Birth control/protection: None  Other Topics Concern   Not on file  Social History Narrative   Not on file   Social Determinants of Health   Financial Resource Strain: Not on file  Food Insecurity: Not on file  Transportation Needs: Not on file  Physical Activity: Not on file  Stress: Not on file  Social Connections: Not on file    Tobacco Counseling Counseling given: Not Answered   Clinical Intake:                  Diabetic?         Activities of Daily Living    07/10/2021    3:23 PM  In your present state of health, do you have any difficulty performing the following activities:  Hearing? 0  Vision? 0  Difficulty concentrating or making decisions? 0  Walking or climbing stairs? 0  Dressing or bathing? 0  Doing errands, shopping? 0    Patient Care Team: [name], PA as PCP - General (Family Medicine) Runell Gess, MD as PCP - Cardiology (Cardiology) Charna Elizabeth, MD as Consulting Physician (Gastroenterology) Runell Gess, MD as Consulting Physician (Cardiology) Harold Hedge, MD as Consulting Physician (Obstetrics and Gynecology)  Indicate any recent Medical Services you may have received from other than [facility] providers in the past year (date may be approximate).     Assessment:   This is a routine wellness examination for Brittany Harvey.  Hearing/Vision screen No results found.  Dietary issues and exercise activities discussed:     Goals Addressed   None    Depression Screen    07/10/2021    3:23 PM 06/02/2021    1:13 PM 04/28/2021    3:33 PM 02/10/2021    9:42 AM 01/09/2021    4:23 PM 09/09/2020    3:59 PM 05/07/2020    8:23 AM  PHQ 2/9 Scores  PHQ - 2 Score 0 0 0 0 0 0 0  PHQ- 9 Score 1 0 0 0 0 0 0    Fall Risk    07/10/2021    3:23 PM 06/02/2021    1:12 PM 04/28/2021    3:32 PM 02/10/2021    9:41 AM 01/09/2021    4:22 PM  Fall Risk   Falls in the past year? 0 0 0 0 0  Number falls in past yr: 0 0 0 0 0  Injury with Fall? 0 0 0 0 0  Risk for fall due to : No Fall Risks No Fall Risks No Fall Risks No Fall Risks No Fall Risks  Follow up Falls evaluation completed  Falls evaluation completed Falls evaluation completed Falls evaluation completed    FALL RISK PREVENTION PERTAINING TO THE HOME:  Any stairs in or around the home?  If so, are there any without handrails?  Home free of loose throw rugs in walkways, pet beds, electrical cords, etc?   Adequate lighting in your home to reduce risk of falls?   ASSISTIVE DEVICES UTILIZED TO PREVENT FALLS:  Life alert?  Use of a cane, walker or w/c?  Grab bars in the bathroom?  Shower chair or bench in showe Elevated toilet seat or a handicapped toilet?   TIMED UP  AND GO:  Was the test performed? .  Length of time to ambulate 10 feet: sec.     Cognitive Function:        01/09/2021    4:08 PM 01/03/2020    2:21 PM 01/12/2019   10:49 AM 12/22/2017    2:57 PM  6CIT Screen  What Year? 0 points 0 points 0 points 0 points  What month? 0 points 0 points 0 points 0 points  What time? 0 points 0 points 0 points 0 points  Count back from 20 0 points 0 points 0 points 0 points  Months in reverse 2 points 0 points 0 points 0 points  Repeat phrase 0 points 0 points 6 points 0 points  Total Score 2 points 0 points 6 points 0 points    Immunizations Immunization History  Administered Date(s) Administered   Influenza, High Dose Seasonal PF 10/31/2015, 10/29/2016, 11/18/2017, 11/02/2019   Influenza-Unspecified 11/04/2018, 10/30/2020, 11/03/2021   Moderna Sars-Covid-2 Vaccination 02/27/2019, 03/27/2019   Pneumococcal Conjugate-13 05/10/2015   Pneumococcal Polysaccharide-23 01/18/2013   Tdap 12/09/2010, 09/30/2012   Zoster Recombinat (Shingrix) 12/22/2017            Qualifies for Shingles Vaccine?   Zostavax completed     Screening Tests Health Maintenance  Topic Date Due   Zoster Vaccines- Shingrix (2 of 2) 02/16/2018   COVID-19 Vaccine (3 - Moderna risk series) 04/24/2019   Medicare Annual Wellness (AWV)  01/09/2022   INFLUENZA VACCINE  08/27/2022   DTaP/Tdap/Td (3 - Td or Tdap) 10/01/2022   Colonoscopy  07/24/2031   Pneumonia Vaccine 15+ Years old  Completed   DEXA SCAN  Completed   Hepatitis C Screening  Completed   HPV VACCINES  Aged Out    Health Maintenance  Health Maintenance Due  Topic Date Due   Zoster Vaccines- Shingrix (2 of 2) 02/16/2018    COVID-19 Vaccine (3 - Moderna risk series) 04/24/2019   Medicare Annual Wellness (AWV)  01/09/2022          Lung Cancer Screening: (Low Dose CT Chest recommended if Age 26-80 years, 30 pack-year currently smoking OR have quit w/in 15years.)  qualify.   Lung Cancer Screening Referral:   Additional Screening:  Hepatitis C Screening:  qualify; Completed   Vision Screening: Recommended annual ophthalmology exams for early detection of glaucoma and other disorders of the eye. Is the patient up to date with their annual eye exam?  Who is the provider or what is the name of the office in which the patient attends annual eye exams?  If pt is not established with a provider, would they like to be referred to a provider to establish care? .   Dental Screening: Recommended annual dental exams for proper oral hygiene  Community Resource Referral / Chronic Care Management: CRR required this visit?    CCM required this visit?       Plan:     I have personally reviewed and noted the following in the patient's chart:   Medical and social history Use of alcohol, tobacco or illicit drugs  Current medications and supplements including opioid prescriptions.  Functional ability and status Nutritional status Physical activity Advanced directives List of other physicians Hospitalizations, surgeries, and ER visits in previous 12 months Vitals Screenings to include cognitive, depression, and falls Referrals and appointments  In addition, I have reviewed and discussed with patient certain preventive protocols, quality metrics, and best practice recommendations. A written personalized care plan for preventive services as well  as general preventive health recommendations were provided to patient.     [place] Constable, LPN   1/61/0960   Nurse Notes:

## 2022-06-25 NOTE — Patient Instructions (Addendum)
Brittany Harvey , Thank you for taking time to come for your Medicare Wellness Visit. I appreciate your ongoing commitment to your health goals. Please review the following plan we discussed and let me know if I can assist you in the future.   These are the goals we discussed:  Goals       No current goals (pt-stated)        This is a list of the screening recommended for you and due dates:  Health Maintenance  Topic Date Due   COVID-19 Vaccine (3 - Moderna risk series) 07/11/2022*   Zoster (Shingles) Vaccine (2 of 2) 09/25/2022*   Flu Shot  08/27/2022   DTaP/Tdap/Td vaccine (3 - Td or Tdap) 10/01/2022   Medicare Annual Wellness Visit  06/25/2023   Colon Cancer Screening  07/24/2031   Pneumonia Vaccine  Completed   DEXA scan (bone density measurement)  Completed   Hepatitis C Screening  Completed   HPV Vaccine  Aged Out  *Topic was postponed. The date shown is not the original due date.    Advanced directives: Please bring a copy of your health care power of attorney and living will to the office to be added to your chart at your convenience.   Conditions/risks identified: None  Next appointment: Follow up in one year for your annual wellness visit     Preventive Care 65 Years and Older, Female Preventive care refers to lifestyle choices and visits with your health care provider that can promote health and wellness. What does preventive care include? A yearly physical exam. This is also called an annual well check. Dental exams once or twice a year. Routine eye exams. Ask your health care provider how often you should have your eyes checked. Personal lifestyle choices, including: Daily care of your teeth and gums. Regular physical activity. Eating a healthy diet. Avoiding tobacco and drug use. Limiting alcohol use. Practicing safe sex. Taking low-dose aspirin every day. Taking vitamin and mineral supplements as recommended by your health care provider. What happens during  an annual well check? The services and screenings done by your health care provider during your annual well check will depend on your age, overall health, lifestyle risk factors, and family history of disease. Counseling  Your health care provider may ask you questions about your: Alcohol use. Tobacco use. Drug use. Emotional well-being. Home and relationship well-being. Sexual activity. Eating habits. History of falls. Memory and ability to understand (cognition). Work and work Astronomer. Reproductive health. Screening  You may have the following tests or measurements: Height, weight, and BMI. Blood pressure. Lipid and cholesterol levels. These may be checked every 5 years, or more frequently if you are over 35 years old. Skin check. Lung cancer screening. You may have this screening every year starting at age 53 if you have a 30-pack-year history of smoking and currently smoke or have quit within the past 15 years. Fecal occult blood test (FOBT) of the stool. You may have this test every year starting at age 66. Flexible sigmoidoscopy or colonoscopy. You may have a sigmoidoscopy every 5 years or a colonoscopy every 10 years starting at age 26. Hepatitis C blood test. Hepatitis B blood test. Sexually transmitted disease (STD) testing. Diabetes screening. This is done by checking your blood sugar (glucose) after you have not eaten for a while (fasting). You may have this done every 1-3 years. Bone density scan. This is done to screen for osteoporosis. You may have this done starting at age  65. Mammogram. This may be done every 1-2 years. Talk to your health care provider about how often you should have regular mammograms. Talk with your health care provider about your test results, treatment options, and if necessary, the need for more tests. Vaccines  Your health care provider may recommend certain vaccines, such as: Influenza vaccine. This is recommended every year. Tetanus,  diphtheria, and acellular pertussis (Tdap, Td) vaccine. You may need a Td booster every 10 years. Zoster vaccine. You may need this after age 110. Pneumococcal 13-valent conjugate (PCV13) vaccine. One dose is recommended after age 66. Pneumococcal polysaccharide (PPSV23) vaccine. One dose is recommended after age 75. Talk to your health care provider about which screenings and vaccines you need and how often you need them. This information is not intended to replace advice given to you by your health care provider. Make sure you discuss any questions you have with your health care provider. Document Released: 02/08/2015 Document Revised: 10/02/2015 Document Reviewed: 11/13/2014 Elsevier Interactive Patient Education  2017 [employer].  Fall Prevention in the Home Falls can cause injuries. They can happen to people of all ages. There are many things you can do to make your home safe and to help prevent falls. What can I do on the outside of my home? Regularly fix the edges of walkways and driveways and fix any cracks. Remove anything that might make you trip as you walk through a door, such as a raised step or threshold. Trim any bushes or trees on the path to your home. Use bright outdoor lighting. Clear any walking paths of anything that might make someone trip, such as rocks or tools. Regularly check to see if handrails are loose or broken. Make sure that both sides of any steps have handrails. Any raised decks and porches should have guardrails on the edges. Have any leaves, snow, or ice cleared regularly. Use sand or salt on walking paths during winter. Clean up any spills in your garage right away. This includes oil or grease spills. What can I do in the bathroom? Use night lights. Install grab bars by the toilet and in the tub and shower. Do not use towel bars as grab bars. Use non-skid mats or decals in the tub or shower. If you need to sit down in the shower, use a plastic,  non-slip stool. Keep the floor dry. Clean up any water that spills on the floor as soon as it happens. Remove soap buildup in the tub or shower regularly. Attach bath mats securely with double-sided non-slip rug tape. Do not have throw rugs and other things on the floor that can make you trip. What can I do in the bedroom? Use night lights. Make sure that you have a light by your bed that is easy to reach. Do not use any sheets or blankets that are too big for your bed. They should not hang down onto the floor. Have a firm chair that has side arms. You can use this for support while you get dressed. Do not have throw rugs and other things on the floor that can make you trip. What can I do in the kitchen? Clean up any spills right away. Avoid walking on wet floors. Keep items that you use a lot in easy-to-reach places. If you need to reach something above you, use a strong step stool that has a grab bar. Keep electrical cords out of the way. Do not use floor polish or wax that makes floors slippery.  If you must use wax, use non-skid floor wax. Do not have throw rugs and other things on the floor that can make you trip. What can I do with my stairs? Do not leave any items on the stairs. Make sure that there are handrails on both sides of the stairs and use them. Fix handrails that are broken or loose. Make sure that handrails are as long as the stairways. Check any carpeting to make sure that it is firmly attached to the stairs. Fix any carpet that is loose or worn. Avoid having throw rugs at the top or bottom of the stairs. If you do have throw rugs, attach them to the floor with carpet tape. Make sure that you have a light switch at the top of the stairs and the bottom of the stairs. If you do not have them, ask someone to add them for you. What else can I do to help prevent falls? Wear shoes that: Do not have high heels. Have rubber bottoms. Are comfortable and fit you well. Are closed  at the toe. Do not wear sandals. If you use a stepladder: Make sure that it is fully opened. Do not climb a closed stepladder. Make sure that both sides of the stepladder are locked into place. Ask someone to hold it for you, if possible. Clearly mark and make sure that you can see: Any grab bars or handrails. First and last steps. Where the edge of each step is. Use tools that help you move around (mobility aids) if they are needed. These include: Canes. Walkers. Scooters. Crutches. Turn on the lights when you go into a dark area. Replace any light bulbs as soon as they burn out. Set up your furniture so you have a clear path. Avoid moving your furniture around. If any of your floors are uneven, fix them. If there are any pets around you, be aware of where they are. Review your medicines with your doctor. Some medicines can make you feel dizzy. This can increase your chance of falling. Ask your doctor what other things that you can do to help prevent falls. This information is not intended to replace advice given to you by your health care provider. Make sure you discuss any questions you have with your health care provider. Document Released: 11/08/2008 Document Revised: 06/20/2015 Document Reviewed: 02/16/2014 Elsevier Interactive Patient Education  2017 [employer].

## 2022-06-26 ENCOUNTER — Telehealth: Payer: Self-pay

## 2022-06-26 NOTE — Telephone Encounter (Signed)
Please scheduled appt for Follow-up as soon as possible for HLD, prediabetes, tachycardia

## 2022-07-07 ENCOUNTER — Ambulatory Visit: Payer: Medicare Other | Admitting: Family Medicine

## 2022-07-14 ENCOUNTER — Ambulatory Visit (INDEPENDENT_AMBULATORY_CARE_PROVIDER_SITE_OTHER): Payer: Medicare Other | Admitting: Family Medicine

## 2022-07-14 ENCOUNTER — Encounter: Payer: Self-pay | Admitting: Family Medicine

## 2022-07-14 VITALS — BP 166/79 | HR 68 | Temp 97.7°F | Ht 61.0 in | Wt 147.0 lb

## 2022-07-14 DIAGNOSIS — I471 Supraventricular tachycardia, unspecified: Secondary | ICD-10-CM | POA: Diagnosis not present

## 2022-07-14 DIAGNOSIS — I10 Essential (primary) hypertension: Secondary | ICD-10-CM | POA: Diagnosis not present

## 2022-07-14 DIAGNOSIS — E782 Mixed hyperlipidemia: Secondary | ICD-10-CM

## 2022-07-14 DIAGNOSIS — R7303 Prediabetes: Secondary | ICD-10-CM | POA: Diagnosis not present

## 2022-07-14 NOTE — Assessment & Plan Note (Signed)
Last A1c 5.5, repeating A1c with labs.  Continue low sugar/carb diet and routine physical activity.  Will continue to monitor.

## 2022-07-14 NOTE — Patient Instructions (Signed)
AT THE PHARMACY: -let them know you need your tetanus booster (Tdap)

## 2022-07-14 NOTE — Progress Notes (Signed)
   Established Patient Office Visit  Subjective   Patient ID: Brittany Harvey, female    DOB: [birth year]  Age: 77 y.o. MRN: [id number]  Chief Complaint  Patient presents with   Follow Up Pre-Diabetes, HLD    HPI Brittany Harvey is a 77 y.o. female presenting today for follow up of hyperlipidemia, prediabetes, tachycardia. Hyperlipidemia: Currently consuming a low fat diet.  Developed myalgias with Crestor previously and does not want to use statin medication again. The 10-year ASCVD risk score (Arnett DK, et al., 2019) is: 27.2% Prediabetes: denies hypoglycemic events, wounds or sores that are not healing well, increased thirst or urination.  Tachycardia (PSVT): Followed by cardiology, most recent office visit 11/18/2021.  Currently taking metoprolol.  ROS Negative unless otherwise noted in HPI   Objective:     BP (!) 166/79   Pulse 68   Temp 97.7 F (36.5 C) (Oral)   Ht 5\' 1"  (1.549 m)   Wt 147 lb (66.7 kg)   SpO2 96%   BMI 27.78 kg/m   Physical Exam Constitutional:      General: She is not in acute distress.    Appearance: Normal appearance.  HENT:     Head: Normocephalic and atraumatic.  Cardiovascular:     Rate and Rhythm: Normal rate and regular rhythm.     Heart sounds: No murmur heard.    No friction rub. No gallop.  Pulmonary:     Effort: Pulmonary effort is normal. No respiratory distress.     Breath sounds: No wheezing, rhonchi or rales.  Skin:    General: Skin is warm and dry.  Neurological:     Mental Status: She is alert and oriented to person, place, and time.      Assessment & Plan:  PSVT (paroxysmal supraventricular tachycardia) Assessment & Plan: Followed by cardiology.  Continue metoprolol 25 mg daily.  Previously was taking 37.5 mg daily but this was changed by cardiology, needs to be updated in her medication list.  Orders: -     CBC with Differential/Platelet; Future -     Comprehensive metabolic panel; Future  Prediabetes Assessment &  Plan: Last A1c 5.5, repeating A1c with labs.  Continue low sugar/carb diet and routine physical activity.  Will continue to monitor.  Orders: -     Hemoglobin A1c; Future  Mixed hyperlipidemia Assessment & Plan: Last lipid panel: LDL 105, HDL 93, triglycerides 64.  Repeating lipid panel.  Patient developed myalgias with Crestor in the past.  Orders: -     Lipid panel; Future  White coat syndrome with hypertension Assessment & Plan: Blood pressure elevated initially at 186/74.  On repeat remained elevated 166/79 though blood pressure repeats have been within normal limits at all other appointments within the past year.  Will continue to monitor.   Requesting records from [facility]. Advised to go to the pharmacy to update her tetanus shot since she is almost due and recently stepped on a toothpick.  Return in about 1 week (around 07/21/2022) for fasting blood work; 6 months follow-up for HLD, prediabetes, PSVT, fasting blood work 1 week before.    [name], PA

## 2022-07-14 NOTE — Assessment & Plan Note (Signed)
Last lipid panel: LDL 105, HDL 93, triglycerides 64.  Repeating lipid panel.  Patient developed myalgias with Crestor in the past.

## 2022-07-14 NOTE — Assessment & Plan Note (Addendum)
Blood pressure elevated initially at 186/74.  On repeat remained elevated 166/79 though blood pressure repeats have been within normal limits at all other appointments within the past year.  Will continue to monitor.

## 2022-07-14 NOTE — Assessment & Plan Note (Signed)
Followed by cardiology.  Continue metoprolol 25 mg daily.  Previously was taking 37.5 mg daily but this was changed by cardiology, needs to be updated in her medication list.

## 2022-07-16 ENCOUNTER — Other Ambulatory Visit: Payer: Self-pay | Admitting: Family Medicine

## 2022-07-23 ENCOUNTER — Other Ambulatory Visit: Payer: Medicare Other

## 2022-07-23 DIAGNOSIS — E782 Mixed hyperlipidemia: Secondary | ICD-10-CM | POA: Diagnosis not present

## 2022-07-23 DIAGNOSIS — R7303 Prediabetes: Secondary | ICD-10-CM

## 2022-07-23 DIAGNOSIS — I471 Supraventricular tachycardia, unspecified: Secondary | ICD-10-CM

## 2022-07-24 LAB — CBC WITH DIFFERENTIAL/PLATELET
Basophils Absolute: 0.1 10*3/uL (ref 0.0–0.2)
Basos: 1 %
EOS (ABSOLUTE): 0.2 10*3/uL (ref 0.0–0.4)
Eos: 3 %
Hematocrit: 38.1 % (ref 34.0–46.6)
Hemoglobin: 13 g/dL (ref 11.1–15.9)
Immature Grans (Abs): 0 10*3/uL (ref 0.0–0.1)
Immature Granulocytes: 0 %
Lymphocytes Absolute: 3 10*3/uL (ref 0.7–3.1)
Lymphs: 41 %
MCH: 29.9 pg (ref 26.6–33.0)
MCHC: 34.1 g/dL (ref 31.5–35.7)
MCV: 88 fL (ref 79–97)
Monocytes Absolute: 0.5 10*3/uL (ref 0.1–0.9)
Monocytes: 7 %
Neutrophils Absolute: 3.5 10*3/uL (ref 1.4–7.0)
Neutrophils: 48 %
Platelets: 276 10*3/uL (ref 150–450)
RBC: 4.35 x10E6/uL (ref 3.77–5.28)
RDW: 12.8 % (ref 11.7–15.4)
WBC: 7.4 10*3/uL (ref 3.4–10.8)

## 2022-07-24 LAB — COMPREHENSIVE METABOLIC PANEL
ALT: 13 IU/L (ref 0–32)
AST: 19 IU/L (ref 0–40)
Albumin: 4.4 g/dL (ref 3.8–4.8)
Alkaline Phosphatase: 75 IU/L (ref 44–121)
BUN/Creatinine Ratio: 22 (ref 12–28)
BUN: 17 mg/dL (ref 8–27)
Bilirubin Total: 0.7 mg/dL (ref 0.0–1.2)
CO2: 22 mmol/L (ref 20–29)
Calcium: 9.2 mg/dL (ref 8.7–10.3)
Chloride: 108 mmol/L — ABNORMAL HIGH (ref 96–106)
Creatinine, Ser: 0.76 mg/dL (ref 0.57–1.00)
Globulin, Total: 2.7 g/dL (ref 1.5–4.5)
Glucose: 89 mg/dL (ref 70–99)
Potassium: 4 mmol/L (ref 3.5–5.2)
Sodium: 144 mmol/L (ref 134–144)
Total Protein: 7.1 g/dL (ref 6.0–8.5)
eGFR: 82 mL/min/{1.73_m2} (ref >59)

## 2022-07-24 LAB — LIPID PANEL
Chol/HDL Ratio: 2.4 ratio (ref 0.0–4.4)
Cholesterol, Total: 225 mg/dL — ABNORMAL HIGH (ref 100–199)
HDL: 92 mg/dL (ref >39)
LDL Chol Calc (NIH): 118 mg/dL — ABNORMAL HIGH (ref 0–99)
Triglycerides: 90 mg/dL (ref 0–149)
VLDL Cholesterol Cal: 15 mg/dL (ref 5–40)

## 2022-07-24 LAB — HEMOGLOBIN A1C
Est. average glucose Bld gHb Est-mCnc: 120 mg/dL
Hgb A1c MFr Bld: 5.8 % — ABNORMAL HIGH (ref 4.8–5.6)

## 2022-07-28 ENCOUNTER — Telehealth: Payer: Self-pay | Admitting: *Deleted

## 2022-07-28 NOTE — Telephone Encounter (Signed)
Pt given lab results 

## 2022-08-04 ENCOUNTER — Telehealth: Payer: Self-pay | Admitting: *Deleted

## 2022-08-04 NOTE — Telephone Encounter (Signed)
Pt informed of below.  

## 2022-08-04 NOTE — Telephone Encounter (Signed)
As long as she is not getting constipated, she can continue with over-the-counter vitamin D 5000 units daily.  If she is getting constipated, then I would recommend vitamin D 2000 units daily.

## 2022-08-04 NOTE — Telephone Encounter (Signed)
Pt calling asking if she should continue to take 5000 vitamin D OTC.  She said that she just wanted to confirm that the dose is ok or should she take a different dose or not at all.

## 2022-10-02 ENCOUNTER — Ambulatory Visit (INDEPENDENT_AMBULATORY_CARE_PROVIDER_SITE_OTHER): Payer: Medicare Other | Admitting: Orthopedic Surgery

## 2022-10-02 ENCOUNTER — Encounter: Payer: Self-pay | Admitting: Orthopedic Surgery

## 2022-10-02 ENCOUNTER — Other Ambulatory Visit (INDEPENDENT_AMBULATORY_CARE_PROVIDER_SITE_OTHER): Payer: Self-pay

## 2022-10-02 DIAGNOSIS — M7551 Bursitis of right shoulder: Secondary | ICD-10-CM

## 2022-10-02 DIAGNOSIS — M25511 Pain in right shoulder: Secondary | ICD-10-CM

## 2022-10-02 DIAGNOSIS — M25551 Pain in right hip: Secondary | ICD-10-CM

## 2022-10-02 MED ORDER — METHYLPREDNISOLONE ACETATE 40 MG/ML IJ SUSP
40.0000 mg | INTRAMUSCULAR | Status: AC | PRN
Start: 2022-10-02 — End: 2022-10-02
  Administered 2022-10-02: 40 mg via INTRA_ARTICULAR

## 2022-10-02 MED ORDER — LIDOCAINE HCL 1 % IJ SOLN
5.0000 mL | INTRAMUSCULAR | Status: AC | PRN
Start: 2022-10-02 — End: 2022-10-02
  Administered 2022-10-02: 5 mL

## 2022-10-02 MED ORDER — BUPIVACAINE HCL 0.5 % IJ SOLN
9.0000 mL | INTRAMUSCULAR | Status: AC | PRN
Start: 2022-10-02 — End: 2022-10-02
  Administered 2022-10-02: 9 mL via INTRA_ARTICULAR

## 2022-10-02 NOTE — Progress Notes (Signed)
Office Visit Note   Patient: Brittany Harvey           Date of Birth: 01-04-1946           MRN: [id number] Visit Date: 10/02/2022 Requested by: [name], PA [address] [name],  [place] [address] PCP: [name], PA  Subjective: Chief Complaint  Patient presents with   Right Hip - Pain   Right Shoulder - Pain    HPI: Brittany Harvey is a 77 y.o. female who presents to the office reporting right shoulder pain of 2 weeks duration.  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  Had some pain since that time.  Runs across the anterior shoulder and into the upper arm.  Has used ice and Tylenol which helps.  She states that is getting some better.  Patient also reports some right leg pain which is worse in the morning.  Feels like there is a tingling and circulation problem in the leg which goes away shortly after she wakes up in the morning.  She does have a known history of fairly severe spinal stenosis and foraminal stenosis at multiple levels in her lumbar spine.  She has had epidural steroid injections in the past.  The right leg pain started when she tripped and moved her leg in an awkward manner.  She is not really reporting any low back pain and no real significant left leg symptoms..                ROS: All systems reviewed are negative as they relate to the chief complaint within the history of present illness.  Patient denies fevers or chills.  Assessment & Plan: Visit Diagnoses:  1. Acute pain of right shoulder   2. Pain in right hip     Plan: Impression is right leg pain which has normal hip exam and palpable pedal pulses.  This looks to be like progression of foraminal stenosis and spinal stenosis in the back.  No real weakness in the leg.  This is something we can follow.  The right shoulder looks like she may have injured  it in terms of rotator cuff pathology.  Surprisingly very little if any loss of strength in that right arm.  Plan subacromial injection today with 6-week return to decide for or against MRI scanning of the shoulder at that time.  Work note provided.  Follow-Up Instructions: No follow-ups on file.   Orders:  Orders Placed This Encounter  Procedures   XR Shoulder Right   XR HIP UNILAT W OR W/O PELVIS 2-3 VIEWS RIGHT   No orders of the defined types were placed in this encounter.     Procedures: Large Joint Inj: R subacromial bursa on 10/02/2022 3:27 PM Indications: diagnostic evaluation and pain Details: 18 G 1.5 in needle, posterior approach  Arthrogram: No  Medications: 9 mL bupivacaine 0.5 %; 40 mg methylPREDNISolone acetate 40 MG/ML; 5 mL lidocaine 1 % Outcome: tolerated well, no immediate complications Procedure, treatment alternatives, risks and benefits explained, specific risks discussed. Consent was given by the patient. Immediately prior to procedure a time out was called to verify the correct patient, procedure, equipment, support staff and site/side marked as required. Patient was prepped and draped in the usual sterile fashion.  Clinical Data: No additional findings.  Objective: Vital Signs: There were no vitals taken for this visit.  Physical Exam:  Constitutional: Patient appears well-developed HEENT:  Head: Normocephalic Eyes:EOM are normal Neck: Normal range of motion Cardiovascular: Normal rate Pulmonary/chest: Effort normal Neurologic: Patient is alert Skin: Skin is warm Psychiatric: Patient has normal mood and affect  Ortho Exam: Ortho exam demonstrates right shoulder crepitus anteriorly with internal/external rotation at both 30 and 90 degrees of motion and abduction.  Rotator cuff strength is fairly symmetric to external rotation and subscap testing with no Popeye deformity on the right.  No discrete AC joint tenderness is present.  Passive range  of motion is maintained bilaterally at 55/95/165.  No discrete AC joint tenderness on the right-hand side.  Both legs are examined.  She has excellent quad and hamstring strength and hip flexion strength bilaterally.  No nerve root tension signs.  No paresthesias L1 S1 bilaterally.  Minimal pain with lateral bending.  Specialty Comments:  No specialty comments available.  Imaging: No results found.   PMFS History: Patient Active Problem List   Diagnosis Date Noted   Diverticulosis of colon 05/07/2020   Statin myopathy 10/10/2019   High risk medications (not anticoagulants) long-term use 02/27/2019   Arthritis 11/15/2018   History of nephrolithiasis 07/22/2018   Statin declined 03/16/2018   White coat syndrome with hypertension 03/15/2017   Vitamin D deficiency 11/10/2016   Stressful job 08/12/2016   Nonintractable headache 08/12/2016   Benign positional vertigo, bilateral 06/10/2016   Environmental and seasonal allergies 06/10/2016   Prediabetes 09/24/2015   Overweight (BMI 25.0-29.9) 09/24/2015   Elevated HDL = 90 09/24/2015   Hyperlipidemia 08/20/2015   Osteoporosis 08/20/2015   Hypertensive disorder 07/24/2014   GERD (gastroesophageal reflux disease) 11/27/2013   PSVT (paroxysmal supraventricular tachycardia) 03/27/2013   Past Medical History:  Diagnosis Date   Anxiety    Atypical chest pain    GERD (gastroesophageal reflux disease)    Heart palpitations    Hyperlipidemia    Hypertension    Kidney stones    Prediabetes    PSVT (paroxysmal supraventricular tachycardia)     Family History  Adopted: Yes    Past Surgical History:  Procedure Laterality Date   CARDIOVASCULAR STRESS TEST  06/19/2009   Post stress myocardial perfusion images show a normal pattern of perfusion in all regions. ECG positive for ischemia. Appears to be a "false-positive" ECG stress test.   TUBAL LIGATION     Social History   Occupational History   Not on file  Tobacco Use   Smoking  status: Former    Current packs/day: 0.00    Types: Cigarettes    Quit date: 01/27/1992    Years since quitting: 30.7   Smokeless tobacco: Never  Vaping Use   Vaping status: Never Used  Substance and Sexual Activity   Alcohol use: No   Drug use: No   Sexual activity: Never    Birth control/protection: None

## 2022-10-29 DIAGNOSIS — Z23 Encounter for immunization: Secondary | ICD-10-CM | POA: Diagnosis not present

## 2022-11-13 ENCOUNTER — Ambulatory Visit (INDEPENDENT_AMBULATORY_CARE_PROVIDER_SITE_OTHER): Payer: Medicare Other | Admitting: Orthopedic Surgery

## 2022-11-13 ENCOUNTER — Encounter: Payer: Self-pay | Admitting: Orthopedic Surgery

## 2022-11-13 DIAGNOSIS — M25511 Pain in right shoulder: Secondary | ICD-10-CM | POA: Diagnosis not present

## 2022-11-13 NOTE — Progress Notes (Signed)
Office Visit Note   Patient: Brittany Harvey           Date of Birth: 01-05-1946           MRN: [id number] Visit Date: 11/13/2022 Requested by: [name], PA [address] [place],  [place] [address] PCP: [name], [place]  Subjective: Chief Complaint  Patient presents with   Right Shoulder - Pain   Right Hip - Pain    HPI: Brittany Harvey is a 77 y.o. female who presents to the office reporting improved right shoulder pain.  Patient also has some "right hip pain" which is likely coming from her back.  She decided not to get the MRI scan of her shoulder.  Overall she is doing better.  Still feels a little knot in the region of the [facility] joint which is compatible with a spur.  Pain not quite as bad as it used to be.  Denies any weakness.  Taking Tylenol for pain.  Injection in the subacromial space did not help..                ROS: All systems reviewed are negative as they relate to the chief complaint within the history of present illness.  Patient denies fevers or chills.  Assessment & Plan: Visit Diagnoses:  1. Acute pain of right shoulder     Plan: Impression is right shoulder pain with pretty reasonable strength on examination today.  No indication for further workup.  Could consider a repeat injection if symptoms recur.  Follow-up as needed.  Follow-Up Instructions: No follow-ups on file.   Orders:  No orders of the defined types were placed in this encounter.  No orders of the defined types were placed in this encounter.     Procedures: No procedures performed   Clinical Data: No additional findings.  Objective: Vital Signs: There were no vitals taken for this visit.  Physical Exam:  Constitutional: Patient appears well-developed HEENT:  Head: Normocephalic Eyes:EOM are normal Neck: Normal range of motion Cardiovascular: Normal rate Pulmonary/chest: Effort normal Neurologic: Patient is alert Skin: Skin is warm Psychiatric: Patient has  normal mood and affect  Ortho Exam: Ortho exam demonstrates slight crepitus with internal/external rotation of the arm at 90 degrees of abduction.  Rotator cuff strength is excellent infraspinatus supraspinatus and subscap muscle testing.  No discrete AC joint tenderness is present but she does have a small spur on the superior aspect of the clavicle.  No restriction of passive range of motion in either shoulder.  Specialty Comments:  No specialty comments available.  Imaging: No results found.   PMFS History: Patient Active Problem List   Diagnosis Date Noted   Diverticulosis of colon 05/07/2020   Statin myopathy 10/10/2019   High risk medications (not anticoagulants) long-term use 02/27/2019   Arthritis 11/15/2018   History of nephrolithiasis 07/22/2018   Statin declined 03/16/2018   White coat syndrome with hypertension 03/15/2017   Vitamin D deficiency 11/10/2016   Stressful job 08/12/2016   Nonintractable headache 08/12/2016   Benign positional vertigo, bilateral 06/10/2016   Environmental and seasonal allergies 06/10/2016   Prediabetes 09/24/2015   Overweight (BMI 25.0-29.9) 09/24/2015   Elevated HDL = 90 09/24/2015   Hyperlipidemia 08/20/2015   Osteoporosis 08/20/2015   Hypertensive disorder 07/24/2014   GERD (gastroesophageal reflux disease) 11/27/2013   PSVT (paroxysmal supraventricular tachycardia) (HCC) 03/27/2013   Past Medical History:  Diagnosis Date   Anxiety    Atypical chest  pain    GERD (gastroesophageal reflux disease)    Heart palpitations    Hyperlipidemia    Hypertension    Kidney stones    Prediabetes    PSVT (paroxysmal supraventricular tachycardia) (HCC)     Family History  Adopted: Yes    Past Surgical History:  Procedure Laterality Date   CARDIOVASCULAR STRESS TEST  06/19/2009   Post stress myocardial perfusion images show a normal pattern of perfusion in all regions. ECG positive for ischemia. Appears to be a "false-positive" ECG stress  test.   TUBAL LIGATION     Social History   Occupational History   Not on file  Tobacco Use   Smoking status: Former    Current packs/day: 0.00    Types: Cigarettes    Quit date: 01/27/1992    Years since quitting: 30.8   Smokeless tobacco: Never  Vaping Use   Vaping status: Never Used  Substance and Sexual Activity   Alcohol use: No   Drug use: No   Sexual activity: Never    Birth control/protection: None

## 2022-12-09 ENCOUNTER — Encounter: Payer: Self-pay | Admitting: Family Medicine

## 2022-12-09 ENCOUNTER — Ambulatory Visit (INDEPENDENT_AMBULATORY_CARE_PROVIDER_SITE_OTHER): Payer: Medicare Other | Admitting: Family Medicine

## 2022-12-09 VITALS — BP 169/78 | HR 76 | Resp 18 | Ht 61.0 in | Wt 148.0 lb

## 2022-12-09 DIAGNOSIS — M792 Neuralgia and neuritis, unspecified: Secondary | ICD-10-CM | POA: Diagnosis not present

## 2022-12-09 DIAGNOSIS — R52 Pain, unspecified: Secondary | ICD-10-CM | POA: Diagnosis not present

## 2022-12-09 DIAGNOSIS — E782 Mixed hyperlipidemia: Secondary | ICD-10-CM

## 2022-12-09 DIAGNOSIS — R519 Headache, unspecified: Secondary | ICD-10-CM

## 2022-12-09 NOTE — Patient Instructions (Signed)
Check your blood pressure at home for the next 2 weeks.  I want to make sure that it is not so high when you are home.  We will get some lab work as well as the imaging that we discussed.  This will give [place] a better idea of what may have caused your symptoms.
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
## 2022-12-17 ENCOUNTER — Other Ambulatory Visit: Payer: Medicare Other

## 2022-12-17 DIAGNOSIS — R519 Headache, unspecified: Secondary | ICD-10-CM

## 2022-12-17 DIAGNOSIS — M792 Neuralgia and neuritis, unspecified: Secondary | ICD-10-CM

## 2022-12-17 DIAGNOSIS — R52 Pain, unspecified: Secondary | ICD-10-CM | POA: Diagnosis not present

## 2022-12-17 DIAGNOSIS — E782 Mixed hyperlipidemia: Secondary | ICD-10-CM

## 2022-12-18 LAB — COMPREHENSIVE METABOLIC PANEL
ALT: 16 [IU]/L (ref 0–32)
AST: 20 [IU]/L (ref 0–40)
Albumin: 4.4 g/dL (ref 3.8–4.8)
Alkaline Phosphatase: 75 [IU]/L (ref 44–121)
BUN/Creatinine Ratio: 19 (ref 12–28)
BUN: 15 mg/dL (ref 8–27)
Bilirubin Total: 0.5 mg/dL (ref 0.0–1.2)
CO2: 22 mmol/L (ref 20–29)
Calcium: 9.5 mg/dL (ref 8.7–10.3)
Chloride: 105 mmol/L (ref 96–106)
Creatinine, Ser: 0.77 mg/dL (ref 0.57–1.00)
Globulin, Total: 2.7 g/dL (ref 1.5–4.5)
Glucose: 91 mg/dL (ref 70–99)
Potassium: 4.1 mmol/L (ref 3.5–5.2)
Sodium: 142 mmol/L (ref 134–144)
Total Protein: 7.1 g/dL (ref 6.0–8.5)
eGFR: 80 mL/min/{1.73_m2} (ref >59)

## 2022-12-18 LAB — CBC WITH DIFFERENTIAL/PLATELET
Basophils Absolute: 0.1 10*3/uL (ref 0.0–0.2)
Basos: 1 %
EOS (ABSOLUTE): 0.2 10*3/uL (ref 0.0–0.4)
Eos: 2 %
Hematocrit: 40.3 % (ref 34.0–46.6)
Hemoglobin: 13.3 g/dL (ref 11.1–15.9)
Immature Grans (Abs): 0 10*3/uL (ref 0.0–0.1)
Immature Granulocytes: 0 %
Lymphocytes Absolute: 2.9 10*3/uL (ref 0.7–3.1)
Lymphs: 35 %
MCH: 30.3 pg (ref 26.6–33.0)
MCHC: 33 g/dL (ref 31.5–35.7)
MCV: 92 fL (ref 79–97)
Monocytes Absolute: 0.5 10*3/uL (ref 0.1–0.9)
Monocytes: 7 %
Neutrophils Absolute: 4.5 10*3/uL (ref 1.4–7.0)
Neutrophils: 55 %
Platelets: 287 10*3/uL (ref 150–450)
RBC: 4.39 x10E6/uL (ref 3.77–5.28)
RDW: 12.7 % (ref 11.7–15.4)
WBC: 8.2 10*3/uL (ref 3.4–10.8)

## 2022-12-18 LAB — LIPID PANEL
Chol/HDL Ratio: 2.5 ratio (ref 0.0–4.4)
Cholesterol, Total: 226 mg/dL — ABNORMAL HIGH (ref 100–199)
HDL: 91 mg/dL (ref >39)
LDL Chol Calc (NIH): 123 mg/dL — ABNORMAL HIGH (ref 0–99)
Triglycerides: 72 mg/dL (ref 0–149)
VLDL Cholesterol Cal: 12 mg/dL (ref 5–40)

## 2022-12-28 ENCOUNTER — Encounter: Payer: Self-pay | Admitting: Family Medicine

## 2022-12-28 ENCOUNTER — Ambulatory Visit (INDEPENDENT_AMBULATORY_CARE_PROVIDER_SITE_OTHER): Payer: Medicare Other | Admitting: Family Medicine

## 2022-12-28 VITALS — BP 110/70 | HR 75 | Resp 18 | Ht 61.0 in | Wt 144.0 lb

## 2022-12-28 DIAGNOSIS — R52 Pain, unspecified: Secondary | ICD-10-CM
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
## 2022-12-28 NOTE — Patient Instructions (Signed)
Our referral coordinator is getting in touch with [facility] outpatient [facility].  They should be contacting you to schedule an appointment within the next couple of days.  If you have not heard anything by Thursday, please let me know.

## 2022-12-31 ENCOUNTER — Ambulatory Visit (HOSPITAL_COMMUNITY): Payer: Medicare Other

## 2023-01-06 ENCOUNTER — Other Ambulatory Visit: Payer: Medicare Other

## 2023-01-08 ENCOUNTER — Telehealth: Payer: Self-pay | Admitting: *Deleted

## 2023-01-08 NOTE — Telephone Encounter (Signed)
LVM to call office to see if we needed to get her an appt scheduled. Looks like she is scheduled on 01/13/23 with provider.       Copied from CRM [id number]. Topic: Appointments - Appointment Scheduling >> Jan 06, 2023  8:13 AM Lovey Newcomer R wrote: Patient/patient representative is calling to schedule an appointment but there are no open appts on the schedule for today. Pts BP and heart rate is fluctuating. She's not sure if its the monitor or not. Please contact patient [phone]

## 2023-01-12 ENCOUNTER — Ambulatory Visit (HOSPITAL_COMMUNITY)
Admission: RE | Admit: 2023-01-12 | Discharge: 2023-01-12 | Disposition: A | Discharge status: Home / Self Care | Payer: Medicare Other | Source: Ambulatory Visit | Attending: Family Medicine | Admitting: Family Medicine

## 2023-01-12 ENCOUNTER — Ambulatory Visit (HOSPITAL_COMMUNITY)
Admission: RE | Admit: 2023-01-12 | Discharge: 2023-01-12 | Disposition: A | Discharge status: Home / Self Care | Payer: Medicare Other | Source: Ambulatory Visit | Attending: Family Medicine

## 2023-01-12 DIAGNOSIS — R52 Pain, unspecified: Secondary | ICD-10-CM

## 2023-01-12 DIAGNOSIS — R519 Headache, unspecified: Secondary | ICD-10-CM | POA: Insufficient documentation

## 2023-01-12 DIAGNOSIS — M792 Neuralgia and neuritis, unspecified: Secondary | ICD-10-CM | POA: Diagnosis not present

## 2023-01-12 DIAGNOSIS — M50322 Other cervical disc degeneration at C5-C6 level: Secondary | ICD-10-CM | POA: Diagnosis not present

## 2023-01-12 DIAGNOSIS — M4802 Spinal stenosis, cervical region: Secondary | ICD-10-CM | POA: Diagnosis not present

## 2023-01-13 ENCOUNTER — Ambulatory Visit (INDEPENDENT_AMBULATORY_CARE_PROVIDER_SITE_OTHER): Payer: Medicare Other | Admitting: Family Medicine

## 2023-01-13 ENCOUNTER — Encounter: Payer: Self-pay | Admitting: Family Medicine

## 2023-01-13 VITALS — BP 129/71 | HR 67 | Resp 18 | Ht 61.0 in | Wt 142.0 lb

## 2023-01-13 DIAGNOSIS — R52 Pain, unspecified: Secondary | ICD-10-CM

## 2023-01-13 DIAGNOSIS — R7303 Prediabetes: Secondary | ICD-10-CM

## 2023-01-13 DIAGNOSIS — I471 Supraventricular tachycardia, unspecified: Secondary | ICD-10-CM

## 2023-01-13 DIAGNOSIS — E782 Mixed hyperlipidemia: Secondary | ICD-10-CM | POA: Diagnosis not present

## 2023-01-13 DIAGNOSIS — M792 Neuralgia and neuritis, unspecified: Secondary | ICD-10-CM

## 2023-01-13 LAB — POCT GLYCOSYLATED HEMOGLOBIN (HGB A1C): Hemoglobin A1C: 5.5 % (ref 4.0–5.6)

## 2023-01-13 NOTE — Assessment & Plan Note (Signed)
A1c 5.5, stable.  Continue low sugar/carb diet and routine physical activity.  Will continue to monitor.

## 2023-01-13 NOTE — Assessment & Plan Note (Signed)
Followed by cardiology.  Continue metoprolol 25 mg daily.  Previously was taking 37.5 mg daily but this was changed by cardiology, needs to be updated in her medication list.  Most recent CMP within normal limits.  Will continue to monitor in coordination with cardiology.

## 2023-01-13 NOTE — Assessment & Plan Note (Addendum)
Last lipid panel: LDL 123, HDL 91, triglycerides 72.  LDL did increase from 105 on previous check patient developed myalgias with Crestor in the past.  Discussed increasing fiber and considering red yeast rice supplement if she would like to avoid medication, ideally LDL goal <70.  Will continue to monitor.

## 2023-01-13 NOTE — Patient Instructions (Addendum)
Once we get the x-ray results, we will let you know what they show and if neurology or orthopedics is the next best step!  We will see you back in 6 months for regular follow-up.  If we do need to see you sooner than that based on imaging results we will be in touch.  CHOLESTEROL: -Increase fiber intake through food, or start daily Metamucil supplement. -Red yeast rice supplement.  METOPROLOL: -You can take one full tablet once a day OR take 1/2 tablet twice a day.

## 2023-01-13 NOTE — Progress Notes (Signed)
Established Patient Office Visit  Subjective   Patient ID: Brittany Harvey, female    DOB: Jan 18, 1946  Age: 77 y.o. MRN: [id number]  Chief Complaint  Patient presents with   Prediabetes   Hyperlipidemia   Tachycardia    HPI Brittany Harvey is a 77 y.o. female presenting today for follow up of hyperlipidemia, prediabetes, PSVT. Hyperlipidemia: Previously developed myalgias with rosuvastatin.  Currently consuming a low fat diet.  The 10-year ASCVD risk score (Arnett DK, et al., 2019) is: 28% Prediabetes: denies hypoglycemic events, wounds or sores that are not healing well, increased thirst or urination.  Tachycardia (PSVT): Followed by cardiology, most recent office visit 11/18/2021.  Currently taking metoprolol.  She has also been checking her blood pressure and pulse at home.  The majority of blood pressures remain <130/80 with only handful of outliers.  Pulse ranges from 55-75.  Outpatient Medications Prior to Visit  Medication Sig   Apple Cider Vinegar 500 MG TABS apple cider vinegar   aspirin EC 81 MG tablet Take 81 mg by mouth daily.   Calcium Citrate-Vitamin D (CALCIUM CITRATE + D PO) Take 1 tablet by mouth daily.   Cholecalciferol (VITAMIN D3) 1000 units CAPS Take 5 capsules by mouth daily. Told pt to increase to 5,000 IU per day   Cranberry 1000 MG CAPS Take 1 capsule by mouth daily.   metoprolol succinate (TOPROL-XL) 25 MG 24 hr tablet TAKE 1 AND 1/2 TABLETS EVERY DAY   Multiple Vitamin (MULTIVITAMIN WITH MINERALS) TABS tablet Take 1 tablet by mouth daily. Centrum Silver   No facility-administered medications prior to visit.    ROS Negative unless otherwise noted in HPI   Objective:     BP (!) 156/73 (BP Location: Left Arm, Patient Position: Sitting, Cuff Size: Normal)   Pulse 67   Resp 18   Ht 5\' 1"  (1.549 m)   Wt 142 lb (64.4 kg)   SpO2 98%   BMI 26.83 kg/m   Physical Exam Constitutional:      General: She is not in acute distress.    Appearance: Normal  appearance.  HENT:     Head: Normocephalic and atraumatic.  Cardiovascular:     Rate and Rhythm: Normal rate and regular rhythm.     Heart sounds: No murmur heard.    No friction rub. No gallop.  Pulmonary:     Effort: Pulmonary effort is normal. No respiratory distress.     Breath sounds: No wheezing, rhonchi or rales.  Skin:    General: Skin is warm and dry.  Neurological:     Mental Status: She is alert and oriented to person, place, and time.     Assessment & Plan:  Prediabetes Assessment & Plan: A1c 5.5, stable.  Continue low sugar/carb diet and routine physical activity.  Will continue to monitor.  Orders: -     POCT glycosylated hemoglobin (Hb A1C)  Mixed hyperlipidemia Assessment & Plan: Last lipid panel: LDL 123, HDL 91, triglycerides 72.  LDL did increase from 105 on previous check patient developed myalgias with Crestor in the past.  Discussed increasing fiber and considering red yeast rice supplement if she would like to avoid medication, ideally LDL goal <70.  Will continue to monitor.   PSVT (paroxysmal supraventricular tachycardia) (HCC) Assessment & Plan: Followed by cardiology.  Continue metoprolol 25 mg daily.  Previously was taking 37.5 mg daily but this was changed by cardiology, needs to be updated in her medication list.  Most recent CMP  within normal limits.  Will continue to monitor in coordination with cardiology.   Shooting pain  Neuropathic pain, arm  Head CT negative, still awaiting results of cervical spine x-rays.  Depending on results, patient agreeable to referral to orthopedics or neurology, whichever is more appropriate.  Return in about 6 months (around 07/14/2023) for follow-up for HLD, preDM, PSVT, fasting blood work 1 week before.    [name], PA

## 2023-02-01 ENCOUNTER — Other Ambulatory Visit (HOSPITAL_COMMUNITY): Payer: Self-pay | Admitting: Family Medicine

## 2023-02-01 DIAGNOSIS — R519 Headache, unspecified: Secondary | ICD-10-CM

## 2023-02-01 DIAGNOSIS — M792 Neuralgia and neuritis, unspecified: Secondary | ICD-10-CM

## 2023-02-01 DIAGNOSIS — R52 Pain, unspecified: Secondary | ICD-10-CM

## 2023-02-01 DIAGNOSIS — M5030 Other cervical disc degeneration, unspecified cervical region: Secondary | ICD-10-CM

## 2023-02-03 ENCOUNTER — Ambulatory Visit: Payer: Self-pay

## 2023-02-03 NOTE — Telephone Encounter (Signed)
 Patient called wanting to discuss X-Ray results with Jairo Ben, PA-C

## 2023-02-03 NOTE — Telephone Encounter (Signed)
 Sounds good April. Just let me know what questions she might have. It is likely that her questions will be better directed to her orthopedic provider as the results/information previously provided were the extent of what was seen on the xray. Orthopedics will likely be able to get more detailed imaging with CT or MRI.

## 2023-02-03 NOTE — Telephone Encounter (Signed)
 LVM for patient to call office to inquire what questions she may have. And to give her the providers information below.

## 2023-03-03 ENCOUNTER — Ambulatory Visit (INDEPENDENT_AMBULATORY_CARE_PROVIDER_SITE_OTHER): Payer: Medicare Other | Admitting: Orthopedic Surgery

## 2023-03-03 DIAGNOSIS — M25511 Pain in right shoulder: Secondary | ICD-10-CM | POA: Diagnosis not present

## 2023-03-04 ENCOUNTER — Encounter: Payer: Self-pay | Admitting: Family Medicine

## 2023-03-05 ENCOUNTER — Encounter: Payer: Self-pay | Admitting: Orthopedic Surgery

## 2023-03-05 ENCOUNTER — Encounter: Payer: Self-pay | Admitting: Family Medicine

## 2023-03-05 NOTE — Progress Notes (Signed)
 Office Visit Note   Patient: Brittany Harvey           Date of Birth: 04/18/1945           MRN: [id number] Visit Date: 03/03/2023 Requested by: Wallace Joesph LABOR, PA [address] [place],  [place] [address] PCP: Wallace Joesph LABOR, [place]  Subjective: Chief Complaint  Patient presents with   Neck - Pain    HPI: Brittany Harvey is a 78 y.o. female who presents to the office reporting some neck and head pain.  Reports some continued neck pain with occasional arm pain.  She had a tight spark on her head around the last clinic visit.  Her neck is now feeling better.  She does have a long history of vertigo.  Had an injection in the right shoulder which did help.  She has occasional left-sided symptoms.  Takes Tylenol  for symptoms.  She does have to be careful how she sleeps.  She has had a CT scan which did not show any intracranial bleeding or acute event..                ROS: All systems reviewed are negative as they relate to the chief complaint within the history of present illness.  Patient denies fevers or chills.  Assessment & Plan: Visit Diagnoses: No diagnosis found.  Plan: Impression is neck and hip pain.  CT scan shows no definite structural problem.  She is getting better.  Has a known small rotator cuff tear in the right shoulder but that is less symptomatic as well.  All in all she is managing with her head and neck situation which is currently improving.  No indication for further workup or injections at this time.  She will follow-up as needed.  Follow-Up Instructions: No follow-ups on file.   Orders:  No orders of the defined types were placed in this encounter.  No orders of the defined types were placed in this encounter.     Procedures: No procedures performed   Clinical Data: No additional findings.  Objective: Vital Signs: There were no vitals taken for this visit.  Physical Exam:  Constitutional: Patient appears well-developed HEENT:  Head:  Normocephalic Eyes:EOM are normal Neck: Normal range of motion Cardiovascular: Normal rate Pulmonary/chest: Effort normal Neurologic: Patient is alert Skin: Skin is warm Psychiatric: Patient has normal mood and affect  Ortho Exam: Ortho exam demonstrates pretty reasonable cervical spine range of motion.  She does have forward flexion and abduction of both shoulders above 90 degrees but is a little bit more painful on the right.  Slight amount of crepitus with passive range of motion on the right but no restriction of external rotation on either side.  Motor or sensory function to both arms intact.  Specialty Comments:  No specialty comments available.  Imaging: No results found.   PMFS History: Patient Active Problem List   Diagnosis Date Noted   Diverticulosis of colon 05/07/2020   Statin myopathy 10/10/2019   High risk medications (not anticoagulants) long-term use 02/27/2019   Arthritis 11/15/2018   History of nephrolithiasis 07/22/2018   Statin declined 03/16/2018   White coat syndrome with hypertension 03/15/2017   Vitamin D  deficiency 11/10/2016   Stressful job 08/12/2016   Nonintractable headache 08/12/2016   Benign positional vertigo, bilateral 06/10/2016   Environmental and seasonal allergies 06/10/2016   Prediabetes 09/24/2015   Overweight (BMI 25.0-29.9) 09/24/2015   Elevated HDL = 90 09/24/2015   Hyperlipidemia 08/20/2015  Osteoporosis 08/20/2015   Hypertensive disorder 07/24/2014   GERD (gastroesophageal reflux disease) 11/27/2013   PSVT (paroxysmal supraventricular tachycardia) (HCC) 03/27/2013   Past Medical History:  Diagnosis Date   Anxiety    Atypical chest pain    GERD (gastroesophageal reflux disease)    Heart palpitations    Hyperlipidemia    Hypertension    Kidney stones    Prediabetes    PSVT (paroxysmal supraventricular tachycardia) (HCC)     Family History  Adopted: Yes    Past Surgical History:  Procedure Laterality Date    CARDIOVASCULAR STRESS TEST  06/19/2009   Post stress myocardial perfusion images show a normal pattern of perfusion in all regions. ECG positive for ischemia. Appears to be a false-positive ECG stress test.   TUBAL LIGATION     Social History   Occupational History   Not on file  Tobacco Use   Smoking status: Former    Current packs/day: 0.00    Types: Cigarettes    Quit date: 01/27/1992    Years since quitting: 31.1    Passive exposure: Current   Smokeless tobacco: Never  Vaping Use   Vaping status: Never Used  Substance and Sexual Activity   Alcohol use: No   Drug use: No   Sexual activity: Never    Birth control/protection: None

## 2023-04-06 ENCOUNTER — Encounter: Payer: Self-pay | Admitting: Emergency Medicine

## 2023-04-06 ENCOUNTER — Ambulatory Visit
Admission: EM | Admit: 2023-04-06 | Discharge: 2023-04-06 | Disposition: A | Discharge status: Home / Self Care | Attending: Family Medicine | Admitting: Family Medicine

## 2023-04-06 ENCOUNTER — Ambulatory Visit: Payer: Self-pay | Admitting: Family Medicine

## 2023-04-06 ENCOUNTER — Other Ambulatory Visit: Payer: Self-pay

## 2023-04-06 DIAGNOSIS — R109 Unspecified abdominal pain: Secondary | ICD-10-CM

## 2023-04-06 DIAGNOSIS — G8929 Other chronic pain: Secondary | ICD-10-CM | POA: Diagnosis not present

## 2023-04-06 DIAGNOSIS — M545 Low back pain, unspecified: Secondary | ICD-10-CM | POA: Diagnosis not present

## 2023-04-06 DIAGNOSIS — Z1211 Encounter for screening for malignant neoplasm of colon: Secondary | ICD-10-CM | POA: Insufficient documentation

## 2023-04-06 LAB — POCT URINALYSIS DIP (MANUAL ENTRY)
Bilirubin, UA: NEGATIVE
Blood, UA: NEGATIVE
Glucose, UA: NEGATIVE mg/dL
Ketones, POC UA: NEGATIVE mg/dL
Nitrite, UA: NEGATIVE
Protein Ur, POC: NEGATIVE mg/dL
Spec Grav, UA: 1.02 (ref 1.010–1.025)
Urobilinogen, UA: 0.2 U/dL
pH, UA: 8 (ref 5.0–8.0)

## 2023-04-06 NOTE — Telephone Encounter (Signed)
 Copied from CRM [id number]. Topic: Clinical - Red Word Triage >> Apr 06, 2023  8:27 AM Almira Coaster wrote: Red Word that prompted transfer to Nurse Triage: Patient is experiencing abdomen and lower back pain for about a week, at times feels nauseous and light headed.  Chief Complaint: Abdominal pain Symptoms: Back pain Frequency: A week Pertinent Negatives: Patient denies vomiting Disposition: [] ED /[x] Urgent Care (no appt availability in office) / [] Appointment(In office/virtual)/ []  [facility]/ [] Home Care/ [] Refused Recommended Disposition /[] [facility]/ []  Follow-up with PCP Additional Notes: Patient called in to report abdominal and lower back pain that has been occurring for about a week. Patient stated the pain feels like gas and she gets relief when she passes gas or has a BM. Patient also stated it feels like she pulled something in her left side. Patient reported nausea, but denied vomiting. This RN advised patient to see a provider within 24 hours, per protocol. No availability at PCP office. Patient opted for UC. This RN advised patient to call back if symptoms worsen. Patient complied.  Reason for Disposition  [1] MODERATE pain (e.g., interferes with normal activities) AND [2] pain comes and goes (cramps) AND [3] present > 24 hours  (Exception: Pain with Vomiting or Diarrhea - see that Guideline.)  Answer Assessment - Initial Assessment Questions 1. LOCATION: "Where does it hurt?"      All over abdomen 2. RADIATION: "Does the pain shoot anywhere else?" (e.g., chest, back)     Lower back 3. ONSET: "When did the pain begin?" (e.g., minutes, hours or days ago)      About a week 4. SUDDEN: "Gradual or sudden onset?"     Sudden 5. PATTERN "Does the pain come and go, or is it constant?"    - If it comes and goes: "How long does it last?" "Do you have pain now?"     (Note: Comes and goes means the pain is intermittent. It goes away completely between bouts.)    - If  constant: "Is it getting better, staying the same, or getting worse?"      (Note: Constant means the pain never goes away completely; most serious pain is constant and gets worse.)      States pain goes away when she passes gas and has a BM, pain can stay for days at time 6. SEVERITY: "How bad is the pain?"  (e.g., Scale 1-10; mild, moderate, or severe)    - MILD (1-3): Doesn't interfere with normal activities, abdomen soft and not tender to touch.     - MODERATE (4-7): Interferes with normal activities or awakens from sleep, abdomen tender to touch.     - SEVERE (8-10): Excruciating pain, doubled over, unable to do any normal activities.       Rates pain a 5 at this time, pain feels like gas, states it feels like she strained something along her left side 7. RECURRENT SYMPTOM: "Have you ever had this type of stomach pain before?" If Yes, ask: "When was the last time?" and "What happened that time?"      States this is the first time 8. 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 RELIEVING/AGGRAVATING FACTORS: "What makes it better or worse?" (e.g., antacids, bending or twisting motion, bowel movement)     Passing gas makes the pain feel better 10.  OTHER SYMPTOMS: "Do you have any other symptoms?" (e.g., back pain, diarrhea, fever, urination pain, vomiting)       Lower back pain, nausea, lightheadedness, denies vomiting, states abnormal bowel movements are normal for her  Protocols used: Abdominal Pain - [facility]

## 2023-04-06 NOTE — ED Triage Notes (Signed)
 Pt here for lower abd pain and lower back pian x 1 week; sts feels like gas and bloating; pt denies urinary sx

## 2023-04-06 NOTE — ED Provider Notes (Signed)
 [facility]    CSN: [id number] Arrival date & time: 04/06/23  0934      History   Chief Complaint Chief Complaint  Patient presents with   Abdominal Pain   Back Pain    HPI Brittany Harvey is a 78 y.o. female.  Patient here today for evaluation of intermittent abdominal pain occurring in the lower abdomen and chronic back pain x 1 week.  Reports symptoms of lower abdominal pain has been present on and off.  She has a history of GERD and diverticulosis without a history of diverticulitis.  She has not had any nausea, vomiting.  She also suffers from chronic constipation.  She reports that abdominal pain was relieved by passing gas. She is not having chest pain. Continues to eat normally.  No nausea, vomiting, or changes in urinary habits Past Medical History:  Diagnosis Date   Anxiety    Atypical chest pain    GERD (gastroesophageal reflux disease)    Heart palpitations    Hyperlipidemia    Hypertension    Kidney stones    Prediabetes    PSVT (paroxysmal supraventricular tachycardia) (HCC)     Patient Active Problem List   Diagnosis Date Noted   Colon cancer screening 04/06/2023   Abnormal weight loss 05/07/2020   Constipation 05/07/2020   Diverticular disease of colon 05/07/2020   Epigastric pain 05/07/2020   History of colonic polyps 05/07/2020   Statin myopathy 10/10/2019   High risk medications (not anticoagulants) long-term use 02/27/2019   Arthritis 11/15/2018   History of nephrolithiasis 07/22/2018   Statin declined 03/16/2018   White coat syndrome with hypertension 03/15/2017   Vitamin D deficiency 11/10/2016   Stressful job 08/12/2016   Nonintractable headache 08/12/2016   Benign positional vertigo, bilateral 06/10/2016   Environmental and seasonal allergies 06/10/2016   Prediabetes 09/24/2015   Overweight (BMI 25.0-29.9) 09/24/2015   Elevated HDL = 90 09/24/2015   Hyperlipidemia 08/20/2015   Osteoporosis 08/20/2015   Hypertensive disorder  07/24/2014   GERD (gastroesophageal reflux disease) 11/27/2013   PSVT (paroxysmal supraventricular tachycardia) (HCC) 03/27/2013    Past Surgical History:  Procedure Laterality Date   CARDIOVASCULAR STRESS TEST  06/19/2009   Post stress myocardial perfusion images show a normal pattern of perfusion in all regions. ECG positive for ischemia. Appears to be a "false-positive" ECG stress test.   TUBAL LIGATION      OB History   No obstetric history on file.      Home Medications    Prior to Admission medications   Medication Sig Start Date End Date Taking? Authorizing Provider  Apple Cider Vinegar 500 MG TABS apple cider vinegar    [provider]  aspirin EC 81 MG tablet Take 81 mg by mouth daily.    [provider]  Calcium Citrate-Vitamin D (CALCIUM CITRATE + D PO) Take 1 tablet by mouth daily.    [provider]  Cholecalciferol (VITAMIN D3) 1000 units CAPS Take 5 capsules by mouth daily. Told pt to increase to 5,000 IU per day    [provider]  Cranberry 1000 MG CAPS Take 1 capsule by mouth daily.    [provider]  metoprolol succinate (TOPROL-XL) 25 MG 24 hr tablet TAKE 1 AND 1/2 TABLETS EVERY DAY 03/05/22   Runell Gess, MD  Multiple Vitamin (MULTIVITAMIN WITH MINERALS) TABS tablet Take 1 tablet by mouth daily. Centrum Silver    [provider]    Family History Family History  Adopted: Yes    Social History Social History   Tobacco Use   Smoking status: Former    Current packs/day: 0.00    Types: Cigarettes    Quit date: 01/27/1992    Years since quitting: 31.2    Passive exposure: Current   Smokeless tobacco: Never  Vaping Use   Vaping status: Never Used  Substance Use Topics   Alcohol use: No   Drug use: No     Allergies   Clindamycin/lincomycin, Penicillins, Erythromycin, Penicillin g, Tussin dm [guaifenesin-dm], Zithromax [azithromycin], and Albuterol   Review of Systems Review of Systems   Gastrointestinal:  Positive for abdominal pain.  Musculoskeletal:  Positive for back pain.     Physical Exam Triage Vital Signs ED Triage Vitals [04/06/23 1222]  Encounter Vitals Group     BP (!) 193/96     Systolic BP Percentile      Diastolic BP Percentile      Pulse Rate 72     Resp 18     Temp 97.9 F (36.6 C)     Temp Source Oral     SpO2 96 %     Weight      Height      Head Circumference      Peak Flow      Pain Score 6     Pain Loc      Pain Education      Exclude from Growth Chart    No data found.  Updated Vital Signs BP (!) 193/96 (BP Location: Right Arm)   Pulse 72   Temp 97.9 F (36.6 C) (Oral)   Resp 18   SpO2 96%   Visual Acuity Right Eye Distance:   Left Eye Distance:   Bilateral Distance:    Right Eye Near:   Left Eye Near:    Bilateral Near:     Physical Exam Vitals reviewed.  Constitutional:      General: She is not in acute distress.    Appearance: She is well-developed. She is not ill-appearing, toxic-appearing or diaphoretic.  HENT:     Head: Normocephalic and atraumatic.  Cardiovascular:     Rate and Rhythm: Normal rate and regular rhythm.  Pulmonary:     Effort: Pulmonary effort is normal.     Breath sounds: Normal breath sounds.  Abdominal:     General: Abdomen is flat. Bowel sounds are normal. There is no distension. There are no signs of injury.     Palpations: Abdomen is soft.     Tenderness: There is no abdominal tenderness.  Musculoskeletal:        General: Normal range of motion.     Cervical back: Normal range of motion and neck supple.     Lumbar back: No tenderness. Negative right straight leg raise test and negative left straight leg raise test.  Skin:    General: Skin is warm and dry.     Capillary Refill: Capillary refill takes less than 2 seconds.  Neurological:     General: No focal deficit present.     Mental Status: She is alert and oriented to person, place, and time.    UC Treatments / Results   Labs (all labs ordered are listed, but only abnormal results are displayed) Labs Reviewed  POCT URINALYSIS DIP (MANUAL ENTRY) - Abnormal; Notable for the following components:      Result Value   Color, UA light yellow (*)    Leukocytes, UA Trace (*)  All other components within normal limits    EKG   Radiology No results found.  Procedures Procedures (including critical care time)  Medications Ordered in UC Medications - No data to display  Initial Impression / Assessment and Plan / UC Course  I have reviewed the triage vital signs and the nursing notes.  Pertinent labs & imaging results that were available during my care of the patient were reviewed by me and considered in my medical decision making (see chart for details).   Chronic low back pain without sciatica , not actively hurting today.  Intermittent abdominal pain, GI exam unremarkable and currently asymptomatic. Encouraged PCP follow-up and ED if abdominal pain becomes severe. Final Clinical Impressions(s) / UC Diagnoses   Final diagnoses:  Chronic bilateral low back pain without sciatica  Intermittent abdominal pain     Discharge Instructions      Your abdominal pain persist or worsen if you are unable to follow-up with your primary care doctor go to the nearest emergency department.     ED Prescriptions   None    PDMP not reviewed this encounter.   Bing Neighbors, NP 04/10/23 1901

## 2023-04-06 NOTE — Discharge Instructions (Signed)
 Your abdominal pain persist or worsen if you are unable to follow-up with your primary care doctor go to the nearest emergency department.

## 2023-04-28 ENCOUNTER — Other Ambulatory Visit: Payer: Self-pay | Admitting: Cardiovascular Disease

## 2023-05-22 ENCOUNTER — Other Ambulatory Visit: Payer: Self-pay | Admitting: Cardiovascular Disease

## 2023-05-25 ENCOUNTER — Other Ambulatory Visit: Payer: Self-pay | Admitting: Family Medicine

## 2023-05-25 NOTE — Telephone Encounter (Signed)
 Copied from CRM [id number]. Topic: Clinical - Medication Refill >> May 25, 2023  4:45 PM Brittany Harvey wrote: Most Recent Primary Care Visit:  Provider: Maryclare Smoke A  Department: [facility]  Visit Type: FOLLOW UP 20  Date: 01/13/2023  Medication: metoprolol  succinate (TOPROL -XL) 25 MG 24 hr tablet  Has the patient contacted their pharmacy? Yes   Is this the correct pharmacy for this prescription? Yes If no, delete pharmacy and type the correct one.  This is the patient's preferred pharmacy:    [facility] - [place], [place] - [address] [place] [address] Phone: [phone] Fax: [phone]   Has the prescription been filled recently? No  Is the patient out of the medication? No  Has the patient been seen for an appointment in the last year OR does the patient have an upcoming appointment? Yes  Can we respond through MyChart? No she prefers a call if possible

## 2023-05-27 ENCOUNTER — Other Ambulatory Visit: Payer: Self-pay | Admitting: Cardiovascular Disease

## 2023-05-31 ENCOUNTER — Telehealth: Payer: Self-pay | Admitting: *Deleted

## 2023-05-31 NOTE — Telephone Encounter (Signed)
 Copied from CRM [id number]. Topic: Clinical - Prescription Issue >> May 31, 2023  4:55 PM Bridgette Campus T wrote: Reason for CRM: metoprolol  succinate (TOPROL -XL) 25 MG 24 hr tablet- Patient was discharged from Cardiology and [facility] [place] [place] [facility] [address]  patient phone number  [phone]

## 2023-06-01 ENCOUNTER — Other Ambulatory Visit: Payer: Self-pay | Admitting: Family Medicine

## 2023-06-01 MED ORDER — METOPROLOL SUCCINATE ER 25 MG PO TB24: 37.5000 mg | ORAL_TABLET | Freq: Every day | ORAL | 3 refills | Status: DC

## 2023-06-01 NOTE — Telephone Encounter (Signed)
 I have sent in refills of the medication

## 2023-06-11 ENCOUNTER — Encounter: Payer: Self-pay | Admitting: Family Medicine

## 2023-06-11 ENCOUNTER — Ambulatory Visit (INDEPENDENT_AMBULATORY_CARE_PROVIDER_SITE_OTHER): Admitting: Family Medicine

## 2023-06-11 VITALS — BP 168/74 | HR 68 | Ht 61.0 in | Wt 141.4 lb

## 2023-06-11 DIAGNOSIS — R519 Headache, unspecified: Secondary | ICD-10-CM | POA: Diagnosis not present

## 2023-06-11 DIAGNOSIS — K219 Gastro-esophageal reflux disease without esophagitis: Secondary | ICD-10-CM

## 2023-06-11 NOTE — Patient Instructions (Addendum)
 It was nice to see you today,  We addressed the following topics today: - Your symptoms could be from indigestion or could be anxiety. - I would like you to try taking over-the-counter Pepcid daily in the morning to see if this improves your symptoms.  If it does not I would like you to try treating the anxiety. - You can try L-theanine over-the-counter daily, 200 mg.  This is an amino acid that can help with calming. - If over-the-counter options do not help we can discuss medications that may help. - Also try deep breathing exercises when you get these sensation.  breathe in for 4 seconds, hold the readout for 4 seconds.  Repeat 4 times.   Have a great day,  Etha Henle, MD

## 2023-06-11 NOTE — Progress Notes (Signed)
   Established Patient Office Visit  Subjective   Patient ID: Brittany Harvey, female    DOB: 19-Apr-1945  Age: 78 y.o. MRN: [id number]  Chief Complaint  Patient presents with   Anxiety    HPI  Subjective - Anxiety: reports feeling anxious frequently without clear triggers - Chest tightness: describes feeling "bubbles" and tightness in chest while driving, making it difficult to breathe; also occurs at home - Feeling like might pass out during episodes. No syncopal episodes - Sometimes experiences burning sensation in chest, uses Gas-X for relief - Symptoms not clearly related to food intake  - Headaches: had imaging studies a few months ago that were normal - Vertigo: diagnosed with positional vertigo; can self-manage by looking up at ceiling  Medications: metoprolol , aspirin  (taken for years), calcium  vitamin D  5000, cranberry juice, multivitamin, apple cider vinegar tablets, occasional Gas-X for indigestion.  PMH: hypertension, positional vertigo, headaches. PSH: none mentioned. FH: no family history of cancer mentioned. 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  ROS: Positive for chest tightness, anxiety, occasional headaches, positional vertigo. Negative for acid reflux, regurgitation.   The 10-year ASCVD risk score (Arnett DK, et al., 2019) is: 30.6%  Health Maintenance Due  Topic Date Due   Zoster Vaccines- Shingrix (2 of 2) 02/16/2018   COVID-19 Vaccine (3 - Moderna risk series) 04/24/2019   Medicare Annual Wellness (AWV)  06/25/2023      Objective:     BP (!) 168/74   Pulse 68   Ht 5\' 1"  (1.549 m)   Wt 141 lb 6.4 oz (64.1 kg)   SpO2 97%   BMI 26.72 kg/m    Physical Exam General: Alert, oriented CV: Regular rate and rhythm Pulmonary: Scar bilaterally GI: Soft, nontender epigastrically.   No results found for any visits on 06/11/23.      Assessment & Plan:   Gastroesophageal reflux disease without esophagitis Assessment & Plan:    - :  symptoms of chest tightness, feeling of passing out    - : possible anxiety vs. GI etiology    - more consistent with anxiety than indigestion    - :recommended trial of OTC antacids (Pepcid/famotidine) to rule out acid-related causes    - Recommended chamomile tea, L-theanine supplement for anxiety symptoms    Nonintractable headache, unspecified chronicity pattern, unspecified headache type Assessment & Plan:   -  likely tension headaches related to stress    - Tylenol  1000mg  q8h prn, avoid NSAIDs due to GI complaints      Return for needs a june appt.  her appt w/ morgan was canceled.    Brittany Pintos, MD

## 2023-06-13 NOTE — Assessment & Plan Note (Signed)
- :   symptoms of chest tightness, feeling of passing out    - : possible anxiety vs. GI etiology    - more consistent with anxiety than indigestion    - :recommended trial of OTC antacids (Pepcid/famotidine) to rule out acid-related causes    - Recommended chamomile tea, L-theanine supplement for anxiety symptoms

## 2023-06-13 NOTE — Assessment & Plan Note (Signed)
-    likely tension headaches related to stress    - Tylenol  1000mg  q8h prn, avoid NSAIDs due to GI complaints

## 2023-07-06 ENCOUNTER — Other Ambulatory Visit: Payer: Self-pay | Admitting: *Deleted

## 2023-07-06 DIAGNOSIS — I10 Essential (primary) hypertension: Secondary | ICD-10-CM

## 2023-07-06 DIAGNOSIS — R7303 Prediabetes: Secondary | ICD-10-CM

## 2023-07-06 DIAGNOSIS — E782 Mixed hyperlipidemia: Secondary | ICD-10-CM

## 2023-07-06 DIAGNOSIS — E559 Vitamin D deficiency, unspecified: Secondary | ICD-10-CM

## 2023-07-09 ENCOUNTER — Other Ambulatory Visit: Payer: Medicare Other

## 2023-07-09 DIAGNOSIS — E782 Mixed hyperlipidemia: Secondary | ICD-10-CM

## 2023-07-09 DIAGNOSIS — R7303 Prediabetes: Secondary | ICD-10-CM

## 2023-07-09 DIAGNOSIS — E559 Vitamin D deficiency, unspecified: Secondary | ICD-10-CM

## 2023-07-09 DIAGNOSIS — I10 Essential (primary) hypertension: Secondary | ICD-10-CM

## 2023-07-10 LAB — COMPREHENSIVE METABOLIC PANEL WITH GFR
ALT: 13 IU/L (ref 0–32)
AST: 20 IU/L (ref 0–40)
Albumin: 4.5 g/dL (ref 3.8–4.8)
Alkaline Phosphatase: 69 IU/L (ref 44–121)
BUN/Creatinine Ratio: 22 (ref 12–28)
BUN: 16 mg/dL (ref 8–27)
Bilirubin Total: 0.5 mg/dL (ref 0.0–1.2)
CO2: 20 mmol/L (ref 20–29)
Calcium: 9.2 mg/dL (ref 8.7–10.3)
Chloride: 105 mmol/L (ref 96–106)
Creatinine, Ser: 0.72 mg/dL (ref 0.57–1.00)
Globulin, Total: 2.3 g/dL (ref 1.5–4.5)
Glucose: 87 mg/dL (ref 70–99)
Potassium: 4.2 mmol/L (ref 3.5–5.2)
Sodium: 141 mmol/L (ref 134–144)
Total Protein: 6.8 g/dL (ref 6.0–8.5)
eGFR: 87 mL/min/{1.73_m2} (ref >59)

## 2023-07-10 LAB — TSH: TSH: 1.38 u[IU]/mL (ref 0.450–4.500)

## 2023-07-10 LAB — LIPID PANEL
Chol/HDL Ratio: 2.3 ratio (ref 0.0–4.4)
Cholesterol, Total: 214 mg/dL — ABNORMAL HIGH (ref 100–199)
HDL: 92 mg/dL (ref >39)
LDL Chol Calc (NIH): 108 mg/dL — ABNORMAL HIGH (ref 0–99)
Triglycerides: 78 mg/dL (ref 0–149)
VLDL Cholesterol Cal: 14 mg/dL (ref 5–40)

## 2023-07-10 LAB — CBC WITH DIFFERENTIAL/PLATELET
Basophils Absolute: 0.1 10*3/uL (ref 0.0–0.2)
Basos: 1 %
EOS (ABSOLUTE): 0.2 10*3/uL (ref 0.0–0.4)
Eos: 2 %
Hematocrit: 39.7 % (ref 34.0–46.6)
Hemoglobin: 13.1 g/dL (ref 11.1–15.9)
Immature Grans (Abs): 0 10*3/uL (ref 0.0–0.1)
Immature Granulocytes: 0 %
Lymphocytes Absolute: 3 10*3/uL (ref 0.7–3.1)
Lymphs: 42 %
MCH: 30.5 pg (ref 26.6–33.0)
MCHC: 33 g/dL (ref 31.5–35.7)
MCV: 92 fL (ref 79–97)
Monocytes Absolute: 0.5 10*3/uL (ref 0.1–0.9)
Monocytes: 7 %
Neutrophils Absolute: 3.5 10*3/uL (ref 1.4–7.0)
Neutrophils: 48 %
Platelets: 278 10*3/uL (ref 150–450)
RBC: 4.3 x10E6/uL (ref 3.77–5.28)
RDW: 13 % (ref 11.7–15.4)
WBC: 7.2 10*3/uL (ref 3.4–10.8)

## 2023-07-10 LAB — HEMOGLOBIN A1C
Est. average glucose Bld gHb Est-mCnc: 114 mg/dL
Hgb A1c MFr Bld: 5.6 % (ref 4.8–5.6)

## 2023-07-10 LAB — VITAMIN D 25 HYDROXY (VIT D DEFICIENCY, FRACTURES): Vit D, 25-Hydroxy: 68.8 ng/mL (ref 30.0–100.0)

## 2023-07-12 ENCOUNTER — Ambulatory Visit: Payer: Self-pay

## 2023-07-15 ENCOUNTER — Ambulatory Visit: Payer: Medicare Other | Admitting: Family Medicine

## 2023-07-16 ENCOUNTER — Ambulatory Visit (INDEPENDENT_AMBULATORY_CARE_PROVIDER_SITE_OTHER)

## 2023-07-16 VITALS — BP 134/60 | HR 72 | Ht 61.0 in | Wt 139.5 lb

## 2023-07-16 DIAGNOSIS — E782 Mixed hyperlipidemia: Secondary | ICD-10-CM

## 2023-07-16 DIAGNOSIS — I10 Essential (primary) hypertension: Secondary | ICD-10-CM

## 2023-07-16 DIAGNOSIS — R7303 Prediabetes: Secondary | ICD-10-CM | POA: Diagnosis not present

## 2023-07-16 DIAGNOSIS — I471 Supraventricular tachycardia, unspecified: Secondary | ICD-10-CM | POA: Diagnosis not present

## 2023-07-16 NOTE — Progress Notes (Signed)
 Established Patient Office Visit  Subjective   Patient ID: Brittany Harvey, female    DOB: 01-Dec-1945  Age: 78 y.o. MRN: [id number]  Chief Complaint  Patient presents with   Medical Management of Chronic Issues    HPI  Brittany Harvey is a 78 y.o. y/o female who presents to the clinic today for follow up on HLD, pre-DM, pSVT   HLD: Patient not currently on any lipid-lowering medications. Controlling with diet and exercise.  The 10-year ASCVD risk score (Arnett DK, et al., 2019) is: 22.3%  Pre-DM: Patient not currently on glucose lowering medications. Controlling with diet and exercise. Denies polydipsia/polyuria. Denies new sores/wounds that are not healing.   PSVT: Previously followed by cardiology but reports that her cardiologist medically released her to be followed by PCP at her last visit. Currently taking metoprolol  for management of her SVT. She has also been checking her blood pressure and pulse at home. The majority of blood pressures remain <130/80 with only handful of outliers. Pulse ranges from 55-75.      ROS Per HPI.    Objective:     BP 134/60   Pulse 72   Ht 5' 1 (1.549 m)   Wt 139 lb 8 oz (63.3 kg)   SpO2 95%   BMI 26.36 kg/m    Physical Exam Constitutional:      General: She is not in acute distress.    Appearance: Normal appearance.   Cardiovascular:     Rate and Rhythm: Normal rate and regular rhythm.     Heart sounds: Normal heart sounds. No murmur heard.    No friction rub. No gallop.  Pulmonary:     Effort: Pulmonary effort is normal. No respiratory distress.     Breath sounds: Normal breath sounds.   Musculoskeletal:        General: No swelling.   Skin:    General: Skin is warm and dry.   Neurological:     General: No focal deficit present.     Mental Status: She is alert.   Psychiatric:        Mood and Affect: Mood normal.        Behavior: Behavior normal.        Thought Content: Thought content normal.    No results found  for any visits on 07/16/23.  Last CBC Lab Results  Component Value Date   WBC 7.2 07/09/2023   HGB 13.1 07/09/2023   HCT 39.7 07/09/2023   MCV 92 07/09/2023   MCH 30.5 07/09/2023   RDW 13.0 07/09/2023   PLT 278 07/09/2023   Last metabolic panel Lab Results  Component Value Date   GLUCOSE 87 07/09/2023   NA 141 07/09/2023   K 4.2 07/09/2023   CL 105 07/09/2023   CO2 20 07/09/2023   BUN 16 07/09/2023   CREATININE 0.72 07/09/2023   EGFR 87 07/09/2023   CALCIUM  9.2 07/09/2023   PHOS 2.9 08/14/2016   PROT 6.8 07/09/2023   ALBUMIN 4.5 07/09/2023   LABGLOB 2.3 07/09/2023   AGRATIO 2.0 08/07/2021   BILITOT 0.5 07/09/2023   ALKPHOS 69 07/09/2023   AST 20 07/09/2023   ALT 13 07/09/2023   ANIONGAP 12 09/09/2014   Last lipids Lab Results  Component Value Date   CHOL 214 (H) 07/09/2023   HDL 92 07/09/2023   LDLCALC 108 (H) 07/09/2023   TRIG 78 07/09/2023   CHOLHDL 2.3 07/09/2023   Last hemoglobin A1c Lab Results  Component Value Date  HGBA1C 5.6 07/09/2023   Last thyroid  functions Lab Results  Component Value Date   TSH 1.380 07/09/2023   T3TOTAL 112 01/12/2019   T4TOTAL 6.3 05/26/2022   Last vitamin D  Lab Results  Component Value Date   VD25OH 68.8 07/09/2023      The 10-year ASCVD risk score (Arnett DK, et al., 2019) is: 22.3%    Assessment & Plan:   Mixed hyperlipidemia Assessment & Plan: Last lipid panel: LDL 108, HDL 92, triglycerides 78.  LDL slightly improved from last visit.  Advised patient to continue controlling with diet and exercise by avoiding foods elevated in saturated/trans fats.  Discussed Zetia as a potential option for the future if lipids remain elevated or increase.  Patient intolerant of statins due to myalgias.  Will recheck lipid panel again in 6 months.   Prediabetes Assessment & Plan: A1c 5.6, stable.  Continue low sugar/carb diet and routine physical activity.  Will continue to monitor.   PSVT (paroxysmal supraventricular  tachycardia) (HCC) Assessment & Plan: Continue metoprolol  25 mg daily.  Last visit with cardiology on 11/18/2021.  Patient reported that she was told to follow-up with cardiology on an as-needed basis following this appointment, however this is not documented in the cardiology note.  Patient currently asymptomatic and taking metoprolol  daily as prescribed.  Advised patient that if she begins to experience chest pain or palpitations again to follow-up with cardiology.  Will continue to monitor and coordination with cardiology.   White coat syndrome with hypertension Assessment & Plan: BP goal less than 130/80.  Blood pressure above goal in office today, essentially at goal on recheck.  Blood pressure at goal on ambulatory blood pressure monitoring readings with averages of SBP 110-120.  Continue metoprolol  25 mg daily.  Encouraged her to continue ambulatory blood pressure monitoring and notify if readings are consistently above 130/80.  Will continue to monitor.    Return in about 3 months (around 10/16/2023) for HLD, pre-DM, PSVT.    Brittany Bennett, PA-C

## 2023-07-16 NOTE — Assessment & Plan Note (Signed)
 Last lipid panel: LDL 108, HDL 92, triglycerides 78.  LDL slightly improved from last visit.  Advised patient to continue controlling with diet and exercise by avoiding foods elevated in saturated/trans fats.  Discussed Zetia as a potential option for the future if lipids remain elevated or increase.  Patient intolerant of statins due to myalgias.  Will recheck lipid panel again in 6 months.

## 2023-07-16 NOTE — Assessment & Plan Note (Signed)
 A1c 5.6, stable.  Continue low sugar/carb diet and routine physical activity.  Will continue to monitor.

## 2023-07-16 NOTE — Assessment & Plan Note (Signed)
 BP goal less than 130/80.  Blood pressure above goal in office today, essentially at goal on recheck.  Blood pressure at goal on ambulatory blood pressure monitoring readings with averages of SBP 110-120.  Continue metoprolol  25 mg daily.  Encouraged her to continue ambulatory blood pressure monitoring and notify if readings are consistently above 130/80.  Will continue to monitor.

## 2023-07-16 NOTE — Assessment & Plan Note (Signed)
 Continue metoprolol  25 mg daily.  Last visit with cardiology on 11/18/2021.  Patient reported that she was told to follow-up with cardiology on an as-needed basis following this appointment, however this is not documented in the cardiology note.  Patient currently asymptomatic and taking metoprolol  daily as prescribed.  Advised patient that if she begins to experience chest pain or palpitations again to follow-up with cardiology.  Will continue to monitor and coordination with cardiology.

## 2023-07-16 NOTE — Patient Instructions (Signed)
 It was nice to see you today!  As we discussed in clinic:  -Continue your daily medications and vitamins! Your blood work is all either normal or improved, which is great!  -Continue monitoring blood pressure at home and notify us  if readings are consistently >130/80.  -I will plan on seeing you back in 6 months with labs the week before!   It was nice to meet you!   If you have any problems before your next visit feel free to message me via MyChart (minor issues or questions) or call the office, otherwise you may reach out to schedule an office visit.  Thank you! Meryl Acosta, PA-C

## 2023-07-28 ENCOUNTER — Encounter

## 2023-10-01 ENCOUNTER — Other Ambulatory Visit: Payer: Self-pay

## 2023-10-01 DIAGNOSIS — I10 Essential (primary) hypertension: Secondary | ICD-10-CM

## 2023-10-01 DIAGNOSIS — E559 Vitamin D deficiency, unspecified: Secondary | ICD-10-CM

## 2023-10-01 DIAGNOSIS — R7303 Prediabetes: Secondary | ICD-10-CM

## 2023-10-01 DIAGNOSIS — E782 Mixed hyperlipidemia: Secondary | ICD-10-CM

## 2023-10-07 ENCOUNTER — Other Ambulatory Visit

## 2023-10-07 DIAGNOSIS — E782 Mixed hyperlipidemia: Secondary | ICD-10-CM

## 2023-10-07 DIAGNOSIS — E559 Vitamin D deficiency, unspecified: Secondary | ICD-10-CM

## 2023-10-07 DIAGNOSIS — R7303 Prediabetes: Secondary | ICD-10-CM

## 2023-10-07 DIAGNOSIS — I10 Essential (primary) hypertension: Secondary | ICD-10-CM

## 2023-10-08 ENCOUNTER — Ambulatory Visit: Payer: Self-pay

## 2023-10-08 LAB — LIPID PANEL
Chol/HDL Ratio: 2.4 ratio (ref 0.0–4.4)
Cholesterol, Total: 223 mg/dL — ABNORMAL HIGH (ref 100–199)
HDL: 94 mg/dL (ref >39)
LDL Chol Calc (NIH): 116 mg/dL — ABNORMAL HIGH (ref 0–99)
Triglycerides: 77 mg/dL (ref 0–149)
VLDL Cholesterol Cal: 13 mg/dL (ref 5–40)

## 2023-10-08 LAB — COMPREHENSIVE METABOLIC PANEL WITH GFR
ALT: 14 IU/L (ref 0–32)
AST: 21 IU/L (ref 0–40)
Albumin: 4.7 g/dL (ref 3.8–4.8)
Alkaline Phosphatase: 72 IU/L (ref 44–121)
BUN/Creatinine Ratio: 24 (ref 12–28)
BUN: 18 mg/dL (ref 8–27)
Bilirubin Total: 0.6 mg/dL (ref 0.0–1.2)
CO2: 25 mmol/L (ref 20–29)
Calcium: 9.8 mg/dL (ref 8.7–10.3)
Chloride: 105 mmol/L (ref 96–106)
Creatinine, Ser: 0.74 mg/dL (ref 0.57–1.00)
Globulin, Total: 2.4 g/dL (ref 1.5–4.5)
Glucose: 90 mg/dL (ref 70–99)
Potassium: 4 mmol/L (ref 3.5–5.2)
Sodium: 144 mmol/L (ref 134–144)
Total Protein: 7.1 g/dL (ref 6.0–8.5)
eGFR: 83 mL/min/1.73 (ref >59)

## 2023-10-08 LAB — CBC WITH DIFFERENTIAL/PLATELET
Basophils Absolute: 0.1 x10E3/uL (ref 0.0–0.2)
Basos: 1 %
EOS (ABSOLUTE): 0.2 x10E3/uL (ref 0.0–0.4)
Eos: 2 %
Hematocrit: 39.2 % (ref 34.0–46.6)
Hemoglobin: 13 g/dL (ref 11.1–15.9)
Immature Grans (Abs): 0 x10E3/uL (ref 0.0–0.1)
Immature Granulocytes: 0 %
Lymphocytes Absolute: 3.7 x10E3/uL — ABNORMAL HIGH (ref 0.7–3.1)
Lymphs: 38 %
MCH: 30.2 pg (ref 26.6–33.0)
MCHC: 33.2 g/dL (ref 31.5–35.7)
MCV: 91 fL (ref 79–97)
Monocytes Absolute: 0.5 x10E3/uL (ref 0.1–0.9)
Monocytes: 6 %
Neutrophils Absolute: 5.1 x10E3/uL (ref 1.4–7.0)
Neutrophils: 53 %
Platelets: 277 x10E3/uL (ref 150–450)
RBC: 4.3 x10E6/uL (ref 3.77–5.28)
RDW: 13 % (ref 11.7–15.4)
WBC: 9.5 x10E3/uL (ref 3.4–10.8)

## 2023-10-08 LAB — VITAMIN D 25 HYDROXY (VIT D DEFICIENCY, FRACTURES): Vit D, 25-Hydroxy: 72.6 ng/mL (ref 30.0–100.0)

## 2023-10-08 LAB — HEMOGLOBIN A1C
Est. average glucose Bld gHb Est-mCnc: 117 mg/dL
Hgb A1c MFr Bld: 5.7 % — ABNORMAL HIGH (ref 4.8–5.6)

## 2023-10-08 LAB — TSH: TSH: 1.66 u[IU]/mL (ref 0.450–4.500)

## 2023-10-08 NOTE — Telephone Encounter (Signed)
 Called and spoke patient/ discussed lab results.

## 2023-10-14 ENCOUNTER — Ambulatory Visit (INDEPENDENT_AMBULATORY_CARE_PROVIDER_SITE_OTHER)

## 2023-10-14 VITALS — BP 170/76 | HR 72 | Temp 97.6°F | Ht 61.0 in | Wt 139.0 lb

## 2023-10-14 DIAGNOSIS — I10 Essential (primary) hypertension: Secondary | ICD-10-CM

## 2023-10-14 DIAGNOSIS — Z23 Encounter for immunization: Secondary | ICD-10-CM | POA: Diagnosis not present

## 2023-10-14 DIAGNOSIS — E782 Mixed hyperlipidemia: Secondary | ICD-10-CM | POA: Diagnosis not present

## 2023-10-14 DIAGNOSIS — R7303 Prediabetes: Secondary | ICD-10-CM | POA: Diagnosis not present

## 2023-10-14 DIAGNOSIS — I471 Supraventricular tachycardia, unspecified: Secondary | ICD-10-CM | POA: Diagnosis not present

## 2023-10-14 DIAGNOSIS — E559 Vitamin D deficiency, unspecified: Secondary | ICD-10-CM

## 2023-10-14 NOTE — Assessment & Plan Note (Signed)
 A1c 5.7, stable.  Continue low sugar/carb diet and routine physical activity.  Will continue to monitor.

## 2023-10-14 NOTE — Assessment & Plan Note (Signed)
 Continue metoprolol  25 mg daily. Last visit with cardiology on 11/18/2021. Patient reported that she was told to follow-up with cardiology on an as-needed basis following this appointment, however this is not documented in the cardiology note. Patient currently asymptomatic and taking metoprolol  daily as prescribed. Advised patient that if she begins to experience chest pain or palpitations again to follow-up with cardiology. Will continue to monitor and coordinate care with cardiology.

## 2023-10-14 NOTE — Assessment & Plan Note (Signed)
 BP goal less than 130/80. Blood pressure above goal in office today and on recheck. Per patient, blood pressure at goal on ambulatory blood pressure monitoring readings with averages of SBP 110-120. Continue metoprolol  25 mg daily for now. Encouraged her to continue ambulatory blood pressure monitoring and notify if readings are consistently above 130/80. Have provided her with a home blood pressure log to record blood pressures and will follow up in 8 weeks to reassess. If still elevated, consider adding losartan without increasing metoprolol  due to risk of bradycardia.

## 2023-10-14 NOTE — Assessment & Plan Note (Signed)
 Last lipid panel: LDL 116, HDL 94, Trig 77. The 10-year ASCVD risk score (Arnett DK, et al., 2019) is: 32.6% Discussed her high ASCVD risk but patient declined re-initiating statin therapy due to previous intolerance. Tried to persuade patient to try Zetia as a non-statin form of lowering cholesterol, however patient still declined at this time. Will plan to recheck again in 6 months and revisit discussion.

## 2023-10-14 NOTE — Assessment & Plan Note (Signed)
 Vitamin D  level within normal range. Continue OTC supplement. Will cont to monitor.

## 2023-10-14 NOTE — Progress Notes (Signed)
 Established Patient Office Visit  Subjective   Patient ID: Brittany Harvey, female    DOB: 1945/02/24  Age: 78 y.o. MRN: [id number]  Chief Complaint  Patient presents with   Medical Management of Chronic Issues    HPI  Discussed the use of AI scribe software for clinical note transcription with the patient, who gave verbal consent to proceed.  History of Present Illness   Brittany Harvey is a 78 year old female with hypertension who presents for a follow-up visit to monitor blood pressure and review lab results.  Hypertension and blood pressure monitoring - Home blood pressure readings typically around 120/70 mmHg - Elevated blood pressure readings during clinic visits, attributed to anxiety from driving - Previously managed with one and a half tablets of metoprolol , reduced to one tablet daily due to episodes of hypotension - Currently takes metoprolol  once daily - No recent episodes of supraventricular tachycardia while on metoprolol  - Recalls a past episode of dizziness and near syncope, but no recent recurrence  Fatigue and sleep disturbance - Persistent fatigue and tiredness - No issues with falling asleep - Sleep frequently disrupted by dog barking at night - No sleep study performed - No recent infections or significant illnesses  Lipid abnormalities and statin intolerance - LDL cholesterol 116 mg/dL, slightly elevated from previous readings - HDL cholesterol 94 mg/dL - Not on cholesterol medication due to previous intolerance to statins, which caused severe leg cramps  Glycemic status - A1c 5.7%, slightly in the prediabetic range but stable compared to previous results  Anxiety related to driving - Experiences anxiety when driving, particularly on highways - Avoids highways and prefers not to drive long distances - Anxiety contributes to elevated blood pressure readings during clinic visits  Headache and visual symptoms - Occasional headaches - No changes in  vision  Functional status and activity level - 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    Objective:     BP (!) 170/76   Pulse 72   Temp 97.6 F (36.4 C) (Oral)   Ht 5' 1 (1.549 m)   Wt 139 lb 0.6 oz (63.1 kg)   SpO2 96%   BMI 26.27 kg/m    Physical Exam Constitutional:      General: She is not in acute distress.    Appearance: Normal appearance.  Cardiovascular:     Rate and Rhythm: Normal rate and regular rhythm.     Heart sounds: Normal heart sounds. No murmur heard.    No friction rub. No gallop.  Pulmonary:     Effort: Pulmonary effort is normal. No respiratory distress.     Breath sounds: Normal breath sounds.  Musculoskeletal:        General: No swelling.  Skin:    General: Skin is warm and dry.  Neurological:     General: No focal deficit present.     Mental Status: She is alert.  Psychiatric:        Mood and Affect: Mood normal.        Behavior: Behavior normal.        Thought Content: Thought content normal.       No results found for any visits on 10/14/23.  Last CBC Lab Results  Component Value Date   WBC 9.5 10/07/2023   HGB 13.0 10/07/2023   HCT 39.2 10/07/2023   MCV 91 10/07/2023   MCH 30.2 10/07/2023   RDW 13.0 10/07/2023   PLT 277 10/07/2023   Last metabolic panel Lab Results  Component Value Date   GLUCOSE 90 10/07/2023   NA 144 10/07/2023   K 4.0 10/07/2023   CL 105 10/07/2023   CO2 25 10/07/2023   BUN 18 10/07/2023   CREATININE 0.74 10/07/2023   EGFR 83 10/07/2023   CALCIUM  9.8 10/07/2023   PHOS 2.9 08/14/2016   PROT 7.1 10/07/2023   ALBUMIN 4.7 10/07/2023   LABGLOB 2.4 10/07/2023   AGRATIO 2.0 08/07/2021   BILITOT 0.6 10/07/2023   ALKPHOS 72 10/07/2023   AST 21 10/07/2023   ALT 14 10/07/2023    ANIONGAP 12 09/09/2014   Last lipids Lab Results  Component Value Date   CHOL 223 (H) 10/07/2023   HDL 94 10/07/2023   LDLCALC 116 (H) 10/07/2023   TRIG 77 10/07/2023   CHOLHDL 2.4 10/07/2023   Last hemoglobin A1c Lab Results  Component Value Date   HGBA1C 5.7 (H) 10/07/2023   Last thyroid  functions Lab Results  Component Value Date   TSH 1.660 10/07/2023   T3TOTAL 112 01/12/2019   T4TOTAL 6.3 05/26/2022   Last vitamin D  Lab Results  Component Value Date   VD25OH 72.6 10/07/2023      The 10-year ASCVD risk score (Arnett DK, et al., 2019) is: 32.6%    Assessment & Plan:   Encounter for vaccination -     Flu vaccine HIGH DOSE PF(Fluzone Trivalent)  White coat syndrome with hypertension Assessment & Plan: BP goal less than 130/80. Blood pressure above goal in office today and on recheck. Per patient, blood pressure at goal on ambulatory blood pressure monitoring readings with averages of SBP 110-120. Continue metoprolol  25 mg daily for now. Encouraged her to continue ambulatory blood pressure monitoring and notify if readings are consistently above 130/80. Have provided her with a home blood pressure log to record blood pressures and will follow up in 8 weeks to reassess. If still elevated, consider adding losartan without increasing metoprolol  due to risk of bradycardia.    Mixed hyperlipidemia Assessment & Plan: Last lipid panel: LDL 116, HDL 94, Trig 77. The 10-year ASCVD risk score (Arnett DK, et al., 2019) is: 32.6% Discussed her high ASCVD risk but patient declined re-initiating statin therapy due to previous intolerance. Tried to persuade patient to try Zetia as a non-statin form of lowering cholesterol, however patient still declined at this time. Will plan to recheck again in 6 months and revisit discussion.   Prediabetes Assessment & Plan: A1c 5.7, stable.  Continue low sugar/carb diet and routine physical activity.  Will continue to monitor.   Vitamin D   deficiency Assessment & Plan: Vitamin D  level within normal range. Continue OTC supplement. Will cont to monitor.   PSVT (paroxysmal supraventricular tachycardia) (HCC) Assessment & Plan: Continue metoprolol  25 mg daily. Last visit with cardiology on 11/18/2021. Patient reported that she was told to follow-up with cardiology on an as-needed basis following this appointment, however this is not documented in the cardiology note. Patient currently asymptomatic and taking metoprolol  daily as prescribed. Advised patient that if she begins to experience chest pain or palpitations again to follow-up with cardiology. Will continue to monitor and coordinate care with cardiology.      Return in about 8 weeks (around 12/09/2023) for HTN.  Saddie JULIANNA Sacks, PA-C

## 2023-10-14 NOTE — Patient Instructions (Addendum)
 VISIT SUMMARY: During today's visit, we reviewed your blood pressure, lab results, and discussed your ongoing health concerns including hypertension, fatigue, lipid levels, prediabetes, and anxiety related to driving. We also talked about your general health maintenance and preventive measures.  YOUR PLAN: -HYPERTENSION: Hypertension means high blood pressure. Your home blood pressure readings are generally normal, but they are elevated during clinic visits likely due to anxiety from driving. We will recheck your blood pressure before you leave today and provide you with a log to monitor your blood pressure at home. If your home readings indicate high blood pressure, we may consider increasing your metoprolol  dosage. We also suggest taking metoprolol  at night to help with sleep. We will reassess your blood pressure in 3 months.  -ANXIETY RELATED TO DRIVING: Anxiety related to driving can cause temporary increases in blood pressure. We recommend monitoring your blood pressure at home to determine if the high readings are due to anxiety or if they are persistent.  -HYPERLIPIDEMIA: Hyperlipidemia means having high levels of fats (lipids) in your blood. Your LDL cholesterol is slightly elevated, but your HDL cholesterol is protective. Since you have had side effects from statins, we discussed the option of Zetia but decided to hold off as your LDL is not significantly elevated.  -PREDIABETES: Prediabetes means your blood sugar levels are higher than normal but not high enough to be classified as diabetes. Your A1c is 5.7%, which is consistent with previous readings and not a significant concern at this time.  -FATIGUE: Fatigue means feeling very tired. Your blood work is normal, but we discussed the quality of your sleep and the possibility of sleep apnea. If your fatigue persists or worsens, we may consider a home sleep study.  -GENERAL HEALTH MAINTENANCE: We discussed the importance of getting a flu  vaccination, especially since you are over 65. We will administer a high-dose flu vaccine today to help protect you from the flu.  INSTRUCTIONS: Please recheck your blood pressure before leaving the clinic today. Continue to monitor your blood pressure at home and keep a log of your readings. We will reassess your blood pressure in 3 months. If your fatigue persists or worsens, consider a home sleep study. You will receive a high-dose flu vaccine today.  If you have any problems before your next visit feel free to message me via MyChart (minor issues or questions) or call the office, otherwise you may reach out to schedule an office visit.  Thank you! Saddie Sacks, PA-C

## 2023-11-18 ENCOUNTER — Other Ambulatory Visit: Payer: Self-pay | Admitting: Family Medicine

## 2023-12-08 ENCOUNTER — Ambulatory Visit (INDEPENDENT_AMBULATORY_CARE_PROVIDER_SITE_OTHER)

## 2023-12-08 VITALS — BP 161/69 | HR 68 | Temp 97.6°F | Ht 61.0 in | Wt 138.0 lb

## 2023-12-08 DIAGNOSIS — I10 Essential (primary) hypertension: Secondary | ICD-10-CM

## 2023-12-08 DIAGNOSIS — K117 Disturbances of salivary secretion: Secondary | ICD-10-CM | POA: Insufficient documentation

## 2023-12-08 NOTE — Assessment & Plan Note (Signed)
 Excessive salivation possibly compensatory for dry mouth. No identifiable cause at this time.  - Apply Aquaphor or Vaseline to mouth corners.

## 2023-12-08 NOTE — Patient Instructions (Signed)
 VISIT SUMMARY: You had a follow-up visit to check on your hypertension and discuss some new symptoms. Your blood pressure is well-controlled with your current medication. We also talked about the sensation of wetness at the corners of your mouth and recent upper respiratory symptoms.  YOUR PLAN: HYPERTENSION: Your blood pressure is well-controlled with your current medication, metoprolol . -Continue taking metoprolol  25 mg once daily. -Monitor your blood pressure at home twice a week. -Consider getting a new automatic blood pressure monitor for comparison.  EXCESSIVE SALIVATION AND DRY MOUTH: You have a sensation of wetness at the corners of your mouth, which may be due to excessive salivation compensating for dry mouth. -Apply Aquaphor or Vaseline to the corners of your mouth.  GENERAL HEALTH MAINTENANCE: Your vaccinations are up to date, but you are due for your Medicare annual wellness visit. -Schedule your Medicare annual wellness visit. -Follow up in six months with pre-appointment blood work.  If you have any problems before your next visit feel free to message me via MyChart (minor issues or questions) or call the office, otherwise you may reach out to schedule an office visit.  Thank you! Saddie Sacks, PA-C

## 2023-12-08 NOTE — Assessment & Plan Note (Signed)
 BP goal less than 130/80. Blood pressure above goal in office today and on recheck. Patient brought in her home readings over the last 8 weeks and reading are mostly at goal with seldom elevated outliers.  - Continue metoprolol  25 mg once daily. - Monitor blood pressure at home twice weekly. - Consider new automatic blood pressure monitor for comparison. - Patient to drop off updated BP log if a few weeks if new BP monitor shows abnormal readings.

## 2023-12-08 NOTE — Progress Notes (Signed)
   Established Patient Office Visit  Subjective   Patient ID: Brittany Harvey, female    DOB: 1945-04-01  Age: 78 y.o. MRN: [id number]  Chief Complaint  Patient presents with   Hypertension    HPI  Discussed the use of AI scribe software for clinical note transcription with the patient, who gave verbal consent to proceed.  History of Present Illness   Brittany Harvey is a 78 year old female with hypertension who presents for a follow-up visit.  Hypertension - Blood pressure well-controlled at home - Currently taking metoprolol  25 mg once daily - BP log showing average readings of 130s-120's of 60-80's with seldom elevated outliers.  - Prev diagnosis of white coat HTN  Oral sensation abnormality - Sensation of wetness at the corners of the mouth ongoing for a long duration - No associated drooling, chapping, cracking, or soreness at the corners of the mouth - No frequent use of allergy medications such as Claritin  - No dry mouth symptoms - No recent changes in oral hygiene or new oral lesios    ROS Per HPI.    Objective:     BP (!) 161/69   Pulse 68   Temp 97.6 F (36.4 C) (Oral)   Ht 5' 1 (1.549 m)   Wt 138 lb 0.6 oz (62.6 kg)   SpO2 99%   BMI 26.08 kg/m    Physical Exam Constitutional:      General: She is not in acute distress.    Appearance: Normal appearance.  Cardiovascular:     Rate and Rhythm: Normal rate and regular rhythm.     Heart sounds: Normal heart sounds. No murmur heard.    No friction rub. No gallop.  Pulmonary:     Effort: Pulmonary effort is normal. No respiratory distress.     Breath sounds: Normal breath sounds.  Musculoskeletal:        General: No swelling.  Skin:    General: Skin is warm and dry.  Neurological:     General: No focal deficit present.     Mental Status: She is alert.  Psychiatric:        Mood and Affect: Mood normal.        Behavior: Behavior normal.        Thought Content: Thought content normal.      No  results found for any visits on 12/08/23.    The 10-year ASCVD risk score (Arnett DK, et al., 2019) is: 30.7%    Assessment & Plan:   White coat syndrome with hypertension Assessment & Plan: BP goal less than 130/80. Blood pressure above goal in office today and on recheck. Patient brought in her home readings over the last 8 weeks and reading are mostly at goal with seldom elevated outliers.  - Continue metoprolol  25 mg once daily. - Monitor blood pressure at home twice weekly. - Consider new automatic blood pressure monitor for comparison. - Patient to drop off updated BP log if a few weeks if new BP monitor shows abnormal readings.    Excessive salivation Assessment & Plan: Excessive salivation possibly compensatory for dry mouth. No identifiable cause at this time.  - Apply Aquaphor or Vaseline to mouth corners.     Return in about 6 months (around 06/06/2024) for HTN, HLD.    Saddie JULIANNA Sacks, PA-C

## 2023-12-15 ENCOUNTER — Ambulatory Visit: Payer: Self-pay | Admitting: *Deleted

## 2023-12-15 NOTE — Telephone Encounter (Signed)
 FYI Only or Action Required?: Action required by provider: request for appointment.  Patient was last seen in primary care on 12/08/2023 by Gayle Saddie FALCON, PA-C.  Called Nurse Triage reporting Hypertension.  Symptoms began today.  Interventions attempted: Prescription medications: metoprolol .  Symptoms are: unchanged.  Triage Disposition: See PCP When Office is Open (Within 3 Days)  Patient/caregiver understands and will follow disposition?: Yes  Copied from CRM [id number]. Topic: Clinical - Red Word Triage >> Dec 15, 2023 11:40 AM Burnard DEL wrote: Red Word that prompted transfer to Nurse Triage: elevated b/p 170/79, bad headache Reason for Disposition  Systolic BP >= 160 OR Diastolic >= 100  Answer Assessment - Initial Assessment Questions 1. BLOOD PRESSURE: What is your blood pressure? Did you take at least two measurements 5 minutes apart?     170/79- 11:45  ( Patient at work- unable to recheck)         BP ranges- 150-160/60-70 normally 2. ONSET: When did you take your blood pressure?     today 3. HOW: How did you take your blood pressure? (e.g., automatic home BP monitor, visiting nurse)     Automatic cuff- arm 4. HISTORY: Do you have a history of high blood pressure?     yes 5. MEDICINES: Are you taking any medicines for blood pressure? Have you missed any doses recently?     No missed doses 6. OTHER SYMPTOMS: Do you have any symptoms? (e.g., blurred vision, chest pain, difficulty breathing, headache, weakness)     Headache- left side- come and go  Protocols used: Blood Pressure - High-A-AH

## 2023-12-18 ENCOUNTER — Emergency Department (HOSPITAL_COMMUNITY)
Admission: EM | Admit: 2023-12-18 | Discharge: 2023-12-18 | Disposition: A | Discharge status: Home / Self Care | Attending: Emergency Medicine | Admitting: Emergency Medicine

## 2023-12-18 ENCOUNTER — Encounter (HOSPITAL_COMMUNITY): Payer: Self-pay

## 2023-12-18 ENCOUNTER — Other Ambulatory Visit: Payer: Self-pay

## 2023-12-18 DIAGNOSIS — Z79899 Other long term (current) drug therapy: Secondary | ICD-10-CM | POA: Insufficient documentation

## 2023-12-18 DIAGNOSIS — R519 Headache, unspecified: Secondary | ICD-10-CM | POA: Diagnosis present

## 2023-12-18 DIAGNOSIS — Z7982 Long term (current) use of aspirin: Secondary | ICD-10-CM | POA: Insufficient documentation

## 2023-12-18 DIAGNOSIS — I10 Essential (primary) hypertension: Secondary | ICD-10-CM | POA: Diagnosis not present

## 2023-12-18 LAB — BASIC METABOLIC PANEL WITH GFR
Anion gap: 10 (ref 5–15)
BUN: 14 mg/dL (ref 8–23)
CO2: 24 mmol/L (ref 22–32)
Calcium: 9.6 mg/dL (ref 8.9–10.3)
Chloride: 105 mmol/L (ref 98–111)
Creatinine, Ser: 0.79 mg/dL (ref 0.44–1.00)
GFR, Estimated: >60 mL/min (ref >60)
Glucose, Bld: 103 mg/dL — ABNORMAL HIGH (ref 70–99)
Potassium: 3.8 mmol/L (ref 3.5–5.1)
Sodium: 140 mmol/L (ref 135–145)

## 2023-12-18 LAB — CBC
HCT: 36.7 % (ref 36.0–46.0)
Hemoglobin: 12.6 g/dL (ref 12.0–15.0)
MCH: 30.6 pg (ref 26.0–34.0)
MCHC: 34.3 g/dL (ref 30.0–36.0)
MCV: 89.1 fL (ref 80.0–100.0)
Platelets: 289 K/uL (ref 150–400)
RBC: 4.12 MIL/uL (ref 3.87–5.11)
RDW: 13.2 % (ref 11.5–15.5)
WBC: 10.1 K/uL (ref 4.0–10.5)
nRBC: 0 % (ref 0.0–0.2)

## 2023-12-18 MED ORDER — ACETAMINOPHEN 325 MG PO TABS
325.0000 mg | ORAL_TABLET | Freq: Once | ORAL | Status: AC
  Administered 2023-12-18: 325 mg via ORAL
  Filled 2023-12-18: qty 1

## 2023-12-18 NOTE — ED Triage Notes (Addendum)
 Pt arrives via EMS from home. PT reports her blood pressure has been elevated over the past few days despite taking her metoprolol . Reports intermittent headaches. None at this time. No focal neurological deficits. PT AxOx4. States she usually takes her metoprolol  around lunch but recently started taking it in the morning. Since taking it at different times, pt has noticed the blood pressure spikes in the evenings.

## 2023-12-18 NOTE — ED Provider Notes (Signed)
 [place] EMERGENCY DEPARTMENT AT [facility] Provider Note   CSN: [id number] Arrival date & time: 12/18/23  1805     Patient presents with: Hypertension   Brittany Harvey is a 78 y.o. female.    Hypertension     Patient has history of palpitations anxiety hypertension SVT kidney stones acid reflux hyperlipidemia.  Patient presents ED with complaints of high blood pressure.  Patient states she has noticed the last few days her blood pressure has been elevated.  She has not has been taking her blood pressure medications but the symptoms persisted.  She has had some mild headaches but is not having any difficulty with her vision or her speech.  She is not having trouble with her balance.  Prior to Admission medications   Medication Sig Start Date End Date Taking? Authorizing Provider  Apple Cider Vinegar 500 MG TABS apple cider vinegar    [provider]  aspirin  EC 81 MG tablet Take 81 mg by mouth daily.    [provider]  Calcium  Citrate-Vitamin D  (CALCIUM  CITRATE + D PO) Take 1 tablet by mouth daily.    [provider]  Cholecalciferol (VITAMIN D3) 1000 units CAPS Take 5 capsules by mouth daily. Told pt to increase to 5,000 IU per day    [provider]  Cranberry 1000 MG CAPS Take 1 capsule by mouth daily.    [provider]  metoprolol  succinate (TOPROL -XL) 25 MG 24 hr tablet TAKE 1 AND 1/2 TABLETS EVERY DAY 11/19/23   Clapp, Kara F, PA-C  Multiple Vitamin (MULTIVITAMIN WITH MINERALS) TABS tablet Take 1 tablet by mouth daily. Centrum Silver    [provider]    Allergies: Clindamycin/lincomycin, Penicillins, Erythromycin, Penicillin g, Tussin dm [guaifenesin-dm], Zithromax  [azithromycin ], and Albuterol     Review of Systems  Updated Vital Signs BP (!) 154/61   Pulse 69   Temp 98.2 F (36.8 C) (Oral)   Resp 10   SpO2 97%   Physical Exam Vitals and nursing note reviewed.  Constitutional:      General:  She is not in acute distress.    Appearance: She is well-developed.  HENT:     Head: Normocephalic and atraumatic.     Right Ear: External ear normal.     Left Ear: External ear normal.  Eyes:     General: No visual field deficit or scleral icterus.       Right eye: No discharge.        Left eye: No discharge.     Conjunctiva/sclera: Conjunctivae normal.  Neck:     Trachea: No tracheal deviation.  Cardiovascular:     Rate and Rhythm: Normal rate and regular rhythm.  Pulmonary:     Effort: Pulmonary effort is normal. No respiratory distress.     Breath sounds: Normal breath sounds. No stridor. No wheezing or rales.  Abdominal:     General: Bowel sounds are normal. There is no distension.     Palpations: Abdomen is soft.     Tenderness: There is no abdominal tenderness. There is no guarding or rebound.  Musculoskeletal:        General: No tenderness.     Cervical back: Neck supple.  Skin:    General: Skin is warm and dry.     Findings: No rash.  Neurological:     Mental Status: She is alert and oriented to person, place, and time.     Cranial Nerves: No cranial nerve deficit, dysarthria or  facial asymmetry.     Sensory: No sensory deficit.     Motor: No abnormal muscle tone, seizure activity or pronator drift.     Coordination: Coordination normal.     Comments:  able to hold both legs off bed for 5 seconds, sensation intact in all extremities,  no left or right sided neglect, , no nystagmus noted   Psychiatric:        Mood and Affect: Mood normal.     (all labs ordered are listed, but only abnormal results are displayed) Labs Reviewed  BASIC METABOLIC PANEL WITH GFR - Abnormal; Notable for the following components:      Result Value   Glucose, Bld 103 (*)    All other components within normal limits  CBC    EKG: EKG Interpretation Date/Time:  Saturday December 18 2023 19:11:11 EST Ventricular Rate:  73 PR Interval:  198 QRS Duration:  84 QT Interval:  377 QTC  Calculation: 416 R Axis:   62  Text Interpretation: Sinus rhythm No significant change since last tracing Confirmed by Randol Simmonds [phone]) on 12/18/2023 9:50:45 PM  Radiology: No results found.   Procedures   Medications Ordered in the ED  acetaminophen  (TYLENOL ) tablet 325 mg (325 mg Oral Given 12/18/23 1913)    Clinical Course as of 12/18/23 2239  Sat Dec 18, 2023  2150 CBC nl [JK]  2150 Basic metabolic panel(!) nl [JK]    Clinical Course User Index [JK] Randol Simmonds, MD                                 Medical Decision Making Problems Addressed: Hypertension, unspecified type: acute illness or injury  Amount and/or Complexity of Data Reviewed Labs: ordered. Decision-making details documented in ED Course. Radiology: ordered and independent interpretation performed.  Risk OTC drugs.   Patient presented to the ER for evaluation of hypertension.  Patient states she had noticed her blood pressure was elevated in the 160s 170s earlier today.  Patient was not having any other systemic symptoms.  She does not have any neurologic deficits.  She is not having chest pain.  ED workup is reassuring.  EKG does not show any ischemic changes.  CBC and metabolic panel unremarkable.  Patient was monitored in the ED and she did continue to have elevated blood pressures 150s 160s.  Patient states she previously was on 37.5 but was decreased to 25 mg because of her low diastolic blood pressure.  I think she is safe to follow-up with her primary care doctor and cardiologist to review her blood pressure.  If it instructed if she has persistent blood pressures 160s 170s over the weekend she can take an additional 12.5 mg of her metoprolol .  Evaluation and diagnostic testing in the emergency department does not suggest an emergent condition requiring admission or immediate intervention beyond what has been performed at this time.  The patient is safe for discharge and has been instructed to  return immediately for worsening symptoms, change in symptoms or any other concerns.     Final diagnoses:  Hypertension, unspecified type    ED Discharge Orders     None          Randol Simmonds, MD 12/18/23 2239

## 2023-12-18 NOTE — Discharge Instructions (Signed)
 Your blood test today were reassuring.  Follow-up with your doctor this week to review your blood pressure.  As we discussed, if you continue to have elevated blood pressures in the 160s or 170s this weekend you could increase your metoprolol  from 25 mg to 37.5 mg which you had been on previously

## 2023-12-28 ENCOUNTER — Ambulatory Visit

## 2023-12-28 VITALS — BP 142/62 | HR 80 | Ht 61.0 in | Wt 136.8 lb

## 2023-12-28 DIAGNOSIS — I10 Essential (primary) hypertension: Secondary | ICD-10-CM

## 2023-12-28 DIAGNOSIS — M4722 Other spondylosis with radiculopathy, cervical region: Secondary | ICD-10-CM

## 2023-12-28 MED ORDER — CYCLOBENZAPRINE HCL 5 MG PO TABS: 5.0000 mg | ORAL_TABLET | Freq: Every day | ORAL | 0 refills | Status: AC

## 2023-12-28 MED ORDER — CYCLOBENZAPRINE HCL 5 MG PO TABS: 5.0000 mg | ORAL_TABLET | Freq: Every day | ORAL | 0 refills | Status: DC

## 2023-12-28 MED ORDER — MELOXICAM 7.5 MG PO TABS: 7.5000 mg | ORAL_TABLET | Freq: Every day | ORAL | 1 refills | Status: AC

## 2023-12-28 NOTE — Patient Instructions (Signed)
 VISIT SUMMARY: During your visit, we addressed your neck pain, elevated blood pressure, and occasional dizziness. We also reviewed your history of paroxysmal supraventricular tachycardia (PSVT) and dry mouth.  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 -Take cyclobenzaprine  5 mg at bedtime as needed for muscle spasms. -Take meloxicam  in the morning with food to reduce inflammation. -If the pain persists, we may refer you to back to Dr. Addie (orthopedist) for steroid injections. -Consider physical therapy or dry needling if initial treatments do not help.  ESSENTIAL HYPERTENSION: Your blood pressure has been elevated recently, possibly due to neck pain and anxiety. -Continue taking metoprolol , 1.5 tablets daily in the morning. -Monitor your blood pressure regularly and report any low readings. -Follow up in six weeks to reassess your blood pressure management.  PAROXYSMAL SUPRAVENTRICULAR TACHYCARDIA (PSVT): Your PSVT is currently stable, but you have occasional dizziness which may be related to your medication or neck pain. -Monitor your heart rate and dizziness. -Report if your heart rate drops below 60 bpm or if you experience frequent dizziness.  If you have any problems before your next visit feel free to message me via MyChart (minor issues or questions) or call the office, otherwise you may reach out to schedule an office visit.  Thank you! Saddie Sacks, PA-C

## 2023-12-29 DIAGNOSIS — M4722 Other spondylosis with radiculopathy, cervical region: Secondary | ICD-10-CM | POA: Insufficient documentation

## 2023-12-29 NOTE — Progress Notes (Signed)
 Established Patient Office Visit  Subjective   Patient ID: Brittany Harvey, female    DOB: [birth year]  Age: 78 y.o. MRN: [id number]  Chief Complaint  Patient presents with   Hospitalization Follow-up    HPI  Discussed the use of AI scribe software for clinical note transcription with the patient, who gave verbal consent to proceed.  History of Present Illness   Brittany Harvey is a 78 year old female with hypertension who presents with neck pain and elevated blood pressure.  Cervicalgia and associated symptoms - Neck pain with occasional radiation to the head, resulting in headaches - Pain described as a 'weird sensation' with a feeling of something easing around her face - Associated with cracking and popping sounds in the neck, attributed to arthritis - Difficulty sleeping on one side due to pain - Cervical spine x-ray performed in December 2024 revealed chronic degenerative disc space narrowing at c5-c6. At that time she was referred to [facility]. She saw Dr. Addie and has undergone one round of a steroid injection which did provider temporary relief.  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 Recently went to the ED about 2 weeks ago due to elevated BP and associated neck pain/headache  - Blood pressure readings she brings to the office today are generally well controlled with occasional outliers - Currently taking metoprolol  25 mg: one pill in the morning and a half pill in the afternoon - Diet sometimes includes high-sodium foods, which may contribute to elevated blood pressure - Patient also has white coat hypertension which conributes to high readings  - She even bought a new BP cuff and compared the  readings to her old cuff, and readings were similar.   Paroxysmal supraventricular tachycardia (psvt) - History of PSVT, previously managed with metoprolol  and adenosine  - Last cardiology evaluation in October 2023 - Heart rate has remained stable since last cardiology visit  Xerostomia - Uses Biotene for dry mouth - Drinks plenty of water to maintain hydration          ROS Per HPI.    Objective:     BP (!) 142/62   Pulse 80   Ht 5' 1 (1.549 m)   Wt 136 lb 12 oz (62 kg)   SpO2 98%   BMI 25.84 kg/m    Physical Exam Constitutional:      General: She is not in acute distress.    Appearance: Normal appearance.  Cardiovascular:     Rate and Rhythm: Normal rate and regular rhythm.     Heart sounds: Normal heart sounds. No murmur heard.    No friction rub. No gallop.  Pulmonary:     Effort: Pulmonary effort is normal. No respiratory distress.     Breath sounds: Normal breath sounds.  Musculoskeletal:        General: No swelling.     Cervical back: Neck supple. Spasms and tenderness present. No deformity. Pain with movement present.  Skin:    General: Skin is warm and dry.  Neurological:     General: No focal deficit present.     Mental Status: She is alert.  Psychiatric:        Mood and Affect: Mood normal.  Behavior: Behavior normal.        Thought Content: Thought content normal.      No results found for any visits on 12/28/23.    The 10-year ASCVD risk score (Arnett DK, et al., 2019) is: 26.6%    Assessment & Plan:   White coat syndrome with hypertension Assessment & Plan: Hypertension with recent elevation, possibly due to neck pain and anxiety. BP goal less than 130/80. Blood pressure above goal in office today and on recheck. Patients home readings today are mostly at goal or below. Few scattered outliers above goal (See media tab for scanned copy of readings) -She has increased her Metoprolol  to 37.5 mg daily.  - She bought a 2nd blood  pressure cuff to compare home readings and both readings have consistently been comparable.  - Continue metoprolol  1.5 tablets daily in the morning. - Monitor blood pressure regularly, report low readings. - Follow-up in six weeks to reassess management.   Cervical spondylosis with radiculopathy Assessment & Plan: Chronic spondylosis with radiculopathy and spasms at C5-C6, C6-C7. Symptoms include neck pain, popping, spasms due to arthritis and inflammation. Pain may elevate blood pressure and anxiety. - Prescribed cyclobenzaprine  5 mg at bedtime as needed for spasms. - Prescribed meloxicam  in the morning with food for inflammation. - Consider referral back to orthopedist for another steroid injections if pain persists. - Discuss potential for physical therapy or dry needling if initial treatment fails.   Other orders -     Meloxicam ; Take 1 tablet (7.5 mg total) by mouth daily.  Dispense: 30 tablet; Refill: 1 -     Cyclobenzaprine  HCl; Take 1 tablet (5 mg total) by mouth at bedtime.  Dispense: 30 tablet; Refill: 0    Return in about 6 weeks (around 02/08/2024) for HTN, neck pain.    Saddie JULIANNA Sacks, PA-C

## 2023-12-29 NOTE — Assessment & Plan Note (Signed)
 Hypertension with recent elevation, possibly due to neck pain and anxiety. BP goal less than 130/80. Blood pressure above goal in office today and on recheck. Patients home readings today are mostly at goal or below. Few scattered outliers above goal (See media tab for scanned copy of readings) -She has increased her Metoprolol  to 37.5 mg daily.  - She bought a 2nd blood pressure cuff to compare home readings and both readings have consistently been comparable.  - Continue metoprolol  1.5 tablets daily in the morning. - Monitor blood pressure regularly, report low readings. - Follow-up in six weeks to reassess management.

## 2023-12-29 NOTE — Assessment & Plan Note (Signed)
 Chronic spondylosis with radiculopathy and spasms at C5-C6, C6-C7. Symptoms include neck pain, popping, spasms due to arthritis and inflammation. Pain may elevate blood pressure and anxiety. - Prescribed cyclobenzaprine  5 mg at bedtime as needed for spasms. - Prescribed meloxicam  in the morning with food for inflammation. - Consider referral back to orthopedist for another steroid injections if pain persists. - Discuss potential for physical therapy or dry needling if initial treatment fails.

## 2024-02-08 ENCOUNTER — Ambulatory Visit

## 2024-03-16 ENCOUNTER — Ambulatory Visit

## 2024-05-30 ENCOUNTER — Other Ambulatory Visit

## 2024-06-06 ENCOUNTER — Ambulatory Visit
